# Patient Record
Sex: Male | Born: 1950 | Hispanic: Yes | Marital: Married | Smoking: Former smoker
Health system: Southern US, Community
[De-identification: ages and names within clinical notes are randomized; demographics above are authoritative.]

## PROBLEM LIST (undated history)

## (undated) DIAGNOSIS — N186 End stage renal disease: Secondary | ICD-10-CM

## (undated) DIAGNOSIS — J189 Pneumonia, unspecified organism: Secondary | ICD-10-CM

## (undated) DIAGNOSIS — N189 Chronic kidney disease, unspecified: Secondary | ICD-10-CM

## (undated) DIAGNOSIS — E119 Type 2 diabetes mellitus without complications: Secondary | ICD-10-CM

## (undated) DIAGNOSIS — H547 Unspecified visual loss: Secondary | ICD-10-CM

## (undated) DIAGNOSIS — E11319 Type 2 diabetes mellitus with unspecified diabetic retinopathy without macular edema: Secondary | ICD-10-CM

## (undated) DIAGNOSIS — E0590 Thyrotoxicosis, unspecified without thyrotoxic crisis or storm: Secondary | ICD-10-CM

## (undated) DIAGNOSIS — I10 Essential (primary) hypertension: Secondary | ICD-10-CM

## (undated) DIAGNOSIS — D649 Anemia, unspecified: Secondary | ICD-10-CM

## (undated) DIAGNOSIS — H409 Unspecified glaucoma: Secondary | ICD-10-CM

## (undated) DIAGNOSIS — R011 Cardiac murmur, unspecified: Secondary | ICD-10-CM

## (undated) HISTORY — PX: EYE SURGERY: SHX253

## (undated) HISTORY — DX: Thyrotoxicosis, unspecified without thyrotoxic crisis or storm: E05.90

## (undated) HISTORY — DX: Anemia, unspecified: D64.9

## (undated) HISTORY — DX: Cardiac murmur, unspecified: R01.1

## (undated) HISTORY — PX: TONSILLECTOMY: SUR1361

## (undated) HISTORY — DX: Unspecified visual loss: H54.7

## (undated) HISTORY — DX: Chronic kidney disease, unspecified: N18.9

## (undated) HISTORY — DX: End stage renal disease: N18.6

---

## 2011-09-19 ENCOUNTER — Ambulatory Visit
Admission: RE | Admit: 2011-09-19 | Discharge: 2011-09-19 | Disposition: A | Discharge status: Home / Self Care | Payer: No Typology Code available for payment source | Source: Ambulatory Visit | Attending: Specialist | Admitting: Specialist

## 2011-09-19 ENCOUNTER — Other Ambulatory Visit: Payer: Self-pay | Admitting: Specialist

## 2011-09-19 DIAGNOSIS — R509 Fever, unspecified: Secondary | ICD-10-CM

## 2011-09-19 DIAGNOSIS — R05 Cough: Secondary | ICD-10-CM

## 2012-03-30 ENCOUNTER — Ambulatory Visit
Admission: RE | Admit: 2012-03-30 | Discharge: 2012-03-30 | Disposition: A | Discharge status: Home / Self Care | Payer: No Typology Code available for payment source | Source: Ambulatory Visit | Attending: Specialist | Admitting: Specialist

## 2012-03-30 ENCOUNTER — Other Ambulatory Visit: Payer: Self-pay | Admitting: Specialist

## 2012-03-30 DIAGNOSIS — R05 Cough: Secondary | ICD-10-CM

## 2012-03-30 DIAGNOSIS — R053 Chronic cough: Secondary | ICD-10-CM

## 2014-02-03 ENCOUNTER — Encounter (INDEPENDENT_AMBULATORY_CARE_PROVIDER_SITE_OTHER): Payer: Self-pay | Admitting: Ophthalmology

## 2014-02-03 DIAGNOSIS — H2510 Age-related nuclear cataract, unspecified eye: Secondary | ICD-10-CM

## 2014-02-03 DIAGNOSIS — E1165 Type 2 diabetes mellitus with hyperglycemia: Secondary | ICD-10-CM

## 2014-02-03 DIAGNOSIS — E1139 Type 2 diabetes mellitus with other diabetic ophthalmic complication: Secondary | ICD-10-CM

## 2014-02-03 DIAGNOSIS — H43819 Vitreous degeneration, unspecified eye: Secondary | ICD-10-CM

## 2014-02-03 DIAGNOSIS — H4310 Vitreous hemorrhage, unspecified eye: Secondary | ICD-10-CM

## 2014-02-03 DIAGNOSIS — E11359 Type 2 diabetes mellitus with proliferative diabetic retinopathy without macular edema: Secondary | ICD-10-CM

## 2014-02-10 DIAGNOSIS — H211X9 Other vascular disorders of iris and ciliary body, unspecified eye: Principal | ICD-10-CM

## 2014-02-10 DIAGNOSIS — E103519 Type 1 diabetes mellitus with proliferative diabetic retinopathy with macular edema, unspecified eye: Secondary | ICD-10-CM | POA: Diagnosis present

## 2014-02-10 NOTE — H&P (Signed)
Brian Fuller is an 63 y.o. male.   Chief Complaint:poor vision both eyes HPI: Diabetic with poor medical control.  Very poor vision right eye and poor vision left eye  No past medical history on file.  No past surgical history on file.  No family history on file. Social History:  has no tobacco, alcohol, and drug history on file.  Allergies: Allergies not on file  No prescriptions prior to admission    Review of systems otherwise negative  There were no vitals taken for this visit.  Physical exam: Mental status: oriented x3. Eyes: See eye exam associated with this date of surgery in media tab.  Scanned in by scanning center Ears, Nose, Throat: within normal limits Neck: Within Normal limits General: within normal limits Chest: Within normal limits Breast: deferred Heart: Within normal limits Abdomen: Within normal limits GU: deferred Extremities: within normal limits Skin: within normal limits  Assessment/Plan Proliferative diabetic retinopathy both eyes.  Vitreous hemorrhage both eyes. Plan: To [facility] for Pars plana vitrectomy right eye with laser treatment and gas injection.  Laser treatment, pan retinal left eye.  Hayden Pedro 02/10/2014, 5:31 PM

## 2014-02-21 ENCOUNTER — Encounter (HOSPITAL_COMMUNITY): Payer: Self-pay | Admitting: Pharmacy Technician

## 2014-02-21 ENCOUNTER — Inpatient Hospital Stay (HOSPITAL_COMMUNITY): Payer: Medicaid Other

## 2014-02-21 ENCOUNTER — Inpatient Hospital Stay (HOSPITAL_COMMUNITY)
Admission: EM | Admit: 2014-02-21 | Discharge: 2014-02-25 | DRG: 682 | Disposition: A | Discharge status: Home / Self Care | Payer: Medicaid Other | Attending: Oncology | Admitting: Oncology

## 2014-02-21 ENCOUNTER — Encounter (HOSPITAL_COMMUNITY): Payer: Self-pay | Admitting: Emergency Medicine

## 2014-02-21 DIAGNOSIS — N184 Chronic kidney disease, stage 4 (severe): Secondary | ICD-10-CM | POA: Diagnosis present

## 2014-02-21 DIAGNOSIS — N186 End stage renal disease: Secondary | ICD-10-CM | POA: Insufficient documentation

## 2014-02-21 DIAGNOSIS — E559 Vitamin D deficiency, unspecified: Secondary | ICD-10-CM | POA: Diagnosis present

## 2014-02-21 DIAGNOSIS — N185 Chronic kidney disease, stage 5: Secondary | ICD-10-CM

## 2014-02-21 DIAGNOSIS — I129 Hypertensive chronic kidney disease with stage 1 through stage 4 chronic kidney disease, or unspecified chronic kidney disease: Secondary | ICD-10-CM | POA: Diagnosis present

## 2014-02-21 DIAGNOSIS — N058 Unspecified nephritic syndrome with other morphologic changes: Secondary | ICD-10-CM | POA: Diagnosis present

## 2014-02-21 DIAGNOSIS — I10 Essential (primary) hypertension: Secondary | ICD-10-CM

## 2014-02-21 DIAGNOSIS — E1129 Type 2 diabetes mellitus with other diabetic kidney complication: Secondary | ICD-10-CM | POA: Diagnosis present

## 2014-02-21 DIAGNOSIS — E119 Type 2 diabetes mellitus without complications: Secondary | ICD-10-CM

## 2014-02-21 DIAGNOSIS — Z992 Dependence on renal dialysis: Secondary | ICD-10-CM

## 2014-02-21 DIAGNOSIS — N039 Chronic nephritic syndrome with unspecified morphologic changes: Secondary | ICD-10-CM

## 2014-02-21 DIAGNOSIS — E875 Hyperkalemia: Secondary | ICD-10-CM | POA: Diagnosis present

## 2014-02-21 DIAGNOSIS — H409 Unspecified glaucoma: Secondary | ICD-10-CM | POA: Diagnosis present

## 2014-02-21 DIAGNOSIS — E118 Type 2 diabetes mellitus with unspecified complications: Secondary | ICD-10-CM

## 2014-02-21 DIAGNOSIS — D631 Anemia in chronic kidney disease: Secondary | ICD-10-CM | POA: Diagnosis present

## 2014-02-21 DIAGNOSIS — E1165 Type 2 diabetes mellitus with hyperglycemia: Secondary | ICD-10-CM | POA: Diagnosis present

## 2014-02-21 DIAGNOSIS — N2581 Secondary hyperparathyroidism of renal origin: Secondary | ICD-10-CM | POA: Diagnosis present

## 2014-02-21 DIAGNOSIS — E1139 Type 2 diabetes mellitus with other diabetic ophthalmic complication: Secondary | ICD-10-CM | POA: Diagnosis present

## 2014-02-21 DIAGNOSIS — N170 Acute kidney failure with tubular necrosis: Secondary | ICD-10-CM | POA: Diagnosis present

## 2014-02-21 DIAGNOSIS — N179 Acute kidney failure, unspecified: Secondary | ICD-10-CM

## 2014-02-21 DIAGNOSIS — D649 Anemia, unspecified: Secondary | ICD-10-CM

## 2014-02-21 DIAGNOSIS — I152 Hypertension secondary to endocrine disorders: Secondary | ICD-10-CM

## 2014-02-21 HISTORY — DX: Unspecified glaucoma: H40.9

## 2014-02-21 HISTORY — DX: Type 2 diabetes mellitus without complications: E11.9

## 2014-02-21 LAB — COMPREHENSIVE METABOLIC PANEL
ALBUMIN: 2.9 g/dL — AB (ref 3.5–5.2)
ALT: 16 U/L (ref 0–53)
AST: 16 U/L (ref 0–37)
Alkaline Phosphatase: 102 U/L (ref 39–117)
Anion gap: 15 (ref 5–15)
BUN: 59 mg/dL — ABNORMAL HIGH (ref 6–23)
CHLORIDE: 106 meq/L (ref 96–112)
CO2: 18 mEq/L — ABNORMAL LOW (ref 19–32)
Calcium: 8.7 mg/dL (ref 8.4–10.5)
Creatinine, Ser: 4.41 mg/dL — ABNORMAL HIGH (ref 0.50–1.35)
GFR calc Af Amer: 15 mL/min — ABNORMAL LOW (ref >90)
GFR calc non Af Amer: 13 mL/min — ABNORMAL LOW (ref >90)
Glucose, Bld: 100 mg/dL — ABNORMAL HIGH (ref 70–99)
POTASSIUM: 5.6 meq/L — AB (ref 3.7–5.3)
SODIUM: 139 meq/L (ref 137–147)
Total Protein: 6.5 g/dL (ref 6.0–8.3)

## 2014-02-21 LAB — URINALYSIS, ROUTINE W REFLEX MICROSCOPIC
Bilirubin Urine: NEGATIVE
Bilirubin Urine: NEGATIVE
GLUCOSE, UA: NEGATIVE mg/dL
GLUCOSE, UA: NEGATIVE mg/dL
Ketones, ur: NEGATIVE mg/dL
Ketones, ur: NEGATIVE mg/dL
LEUKOCYTES UA: NEGATIVE
Leukocytes, UA: NEGATIVE
Nitrite: NEGATIVE
Nitrite: NEGATIVE
PH: 6 (ref 5.0–8.0)
PH: 6 (ref 5.0–8.0)
PROTEIN: 100 mg/dL — AB
Protein, ur: 100 mg/dL — AB
SPECIFIC GRAVITY, URINE: 1.011 (ref 1.005–1.030)
Specific Gravity, Urine: 1.012 (ref 1.005–1.030)
Urobilinogen, UA: 0.2 mg/dL (ref 0.0–1.0)
Urobilinogen, UA: 0.2 mg/dL (ref 0.0–1.0)

## 2014-02-21 LAB — RAPID URINE DRUG SCREEN, HOSP PERFORMED
Amphetamines: NOT DETECTED
Barbiturates: NOT DETECTED
Benzodiazepines: NOT DETECTED
Cocaine: NOT DETECTED
OPIATES: NOT DETECTED
Tetrahydrocannabinol: NOT DETECTED

## 2014-02-21 LAB — I-STAT CHEM 8, ED
BUN: 65 mg/dL — ABNORMAL HIGH (ref 6–23)
Calcium, Ion: 1.19 mmol/L (ref 1.13–1.30)
Chloride: 112 mEq/L (ref 96–112)
Creatinine, Ser: 4.7 mg/dL — ABNORMAL HIGH (ref 0.50–1.35)
Glucose, Bld: 91 mg/dL (ref 70–99)
HCT: 25 % — ABNORMAL LOW (ref 39.0–52.0)
HEMOGLOBIN: 8.5 g/dL — AB (ref 13.0–17.0)
Potassium: 5.4 mEq/L — ABNORMAL HIGH (ref 3.7–5.3)
Sodium: 140 mEq/L (ref 137–147)
TCO2: 18 mmol/L (ref 0–100)

## 2014-02-21 LAB — CBC WITH DIFFERENTIAL/PLATELET
BASOS ABS: 0 10*3/uL (ref 0.0–0.1)
BASOS PCT: 0 % (ref 0–1)
Eosinophils Absolute: 0.2 10*3/uL (ref 0.0–0.7)
Eosinophils Relative: 3 % (ref 0–5)
HCT: 24.5 % — ABNORMAL LOW (ref 39.0–52.0)
Hemoglobin: 7.9 g/dL — ABNORMAL LOW (ref 13.0–17.0)
Lymphocytes Relative: 20 % (ref 12–46)
Lymphs Abs: 1.1 10*3/uL (ref 0.7–4.0)
MCH: 27.8 pg (ref 26.0–34.0)
MCHC: 32.2 g/dL (ref 30.0–36.0)
MCV: 86.3 fL (ref 78.0–100.0)
Monocytes Absolute: 0.3 10*3/uL (ref 0.1–1.0)
Monocytes Relative: 6 % (ref 3–12)
NEUTROS ABS: 3.8 10*3/uL (ref 1.7–7.7)
NEUTROS PCT: 71 % (ref 43–77)
PLATELETS: 238 10*3/uL (ref 150–400)
RBC: 2.84 MIL/uL — ABNORMAL LOW (ref 4.22–5.81)
RDW: 14.4 % (ref 11.5–15.5)
WBC: 5.3 10*3/uL (ref 4.0–10.5)

## 2014-02-21 LAB — URINE MICROSCOPIC-ADD ON

## 2014-02-21 LAB — PROTIME-INR
INR: 1.05 (ref 0.00–1.49)
Prothrombin Time: 13.7 seconds (ref 11.6–15.2)

## 2014-02-21 LAB — I-STAT TROPONIN, ED: TROPONIN I, POC: 0.02 ng/mL (ref 0.00–0.08)

## 2014-02-21 LAB — PREPARE RBC (CROSSMATCH)

## 2014-02-21 LAB — APTT: aPTT: 33 seconds (ref 24–37)

## 2014-02-21 LAB — CREATININE, URINE, RANDOM: Creatinine, Urine: 38.18 mg/dL

## 2014-02-21 LAB — PROTEIN / CREATININE RATIO, URINE
Creatinine, Urine: 40.11 mg/dL
Protein Creatinine Ratio: 3.86 — ABNORMAL HIGH (ref 0.00–0.15)
Total Protein, Urine: 154.8 mg/dL

## 2014-02-21 LAB — POC OCCULT BLOOD, ED: FECAL OCCULT BLD: NEGATIVE

## 2014-02-21 LAB — ABO/RH: ABO/RH(D): O POS

## 2014-02-21 LAB — HEMOGLOBIN A1C
Hgb A1c MFr Bld: 6.3 % — ABNORMAL HIGH (ref <5.7)
Mean Plasma Glucose: 134 mg/dL — ABNORMAL HIGH (ref <117)

## 2014-02-21 LAB — GLUCOSE, CAPILLARY: Glucose-Capillary: 168 mg/dL — ABNORMAL HIGH (ref 70–99)

## 2014-02-21 LAB — TROPONIN I

## 2014-02-21 MED ORDER — AMLODIPINE BESYLATE 10 MG PO TABS
10.0000 mg | ORAL_TABLET | Freq: Every day | ORAL | Status: DC
  Administered 2014-02-21 – 2014-02-25 (×5): 10 mg via ORAL
  Filled 2014-02-21 (×5): qty 1

## 2014-02-21 MED ORDER — FUROSEMIDE 10 MG/ML IJ SOLN
80.0000 mg | Freq: Once | INTRAMUSCULAR | Status: DC
  Filled 2014-02-21 (×2): qty 8

## 2014-02-21 MED ORDER — SODIUM CHLORIDE 0.9 % IJ SOLN
3.0000 mL | Freq: Two times a day (BID) | INTRAMUSCULAR | Status: DC
  Administered 2014-02-21 – 2014-02-25 (×8): 3 mL via INTRAVENOUS

## 2014-02-21 MED ORDER — HYDROCHLOROTHIAZIDE 25 MG PO TABS
25.0000 mg | ORAL_TABLET | Freq: Every day | ORAL | Status: DC
  Administered 2014-02-21 – 2014-02-22 (×2): 25 mg via ORAL
  Filled 2014-02-21 (×2): qty 1

## 2014-02-21 MED ORDER — HEPARIN SODIUM (PORCINE) 5000 UNIT/ML IJ SOLN
5000.0000 [IU] | Freq: Three times a day (TID) | INTRAMUSCULAR | Status: DC
  Administered 2014-02-21 – 2014-02-25 (×11): 5000 [IU] via SUBCUTANEOUS
  Filled 2014-02-21 (×14): qty 1

## 2014-02-21 MED ORDER — ONDANSETRON HCL 4 MG PO TABS: 4.0000 mg | ORAL_TABLET | Freq: Four times a day (QID) | ORAL | Status: DC | PRN

## 2014-02-21 MED ORDER — HYDRALAZINE HCL 20 MG/ML IJ SOLN
10.0000 mg | Freq: Once | INTRAMUSCULAR | Status: AC
  Administered 2014-02-21: 10 mg via INTRAVENOUS
  Filled 2014-02-21: qty 1

## 2014-02-21 MED ORDER — ONDANSETRON HCL 4 MG/2ML IJ SOLN: 4.0000 mg | Freq: Four times a day (QID) | INTRAMUSCULAR | Status: DC | PRN

## 2014-02-21 MED ORDER — SODIUM CHLORIDE 0.9 % IV SOLN
INTRAVENOUS | Status: DC
  Administered 2014-02-21: 16:00:00 via INTRAVENOUS

## 2014-02-21 MED ORDER — FUROSEMIDE 10 MG/ML IJ SOLN
40.0000 mg | Freq: Once | INTRAMUSCULAR | Status: AC
  Administered 2014-02-21: 40 mg via INTRAVENOUS

## 2014-02-21 MED ORDER — INSULIN ASPART 100 UNIT/ML ~~LOC~~ SOLN
0.0000 [IU] | Freq: Three times a day (TID) | SUBCUTANEOUS | Status: DC
  Administered 2014-02-22: 1 [IU] via SUBCUTANEOUS
  Administered 2014-02-22 – 2014-02-24 (×4): 2 [IU] via SUBCUTANEOUS

## 2014-02-21 NOTE — Progress Notes (Signed)
Admission note:   Arrival Method: Via stretcher from ED. Mental Status: A&Ox4, spanish speaking. Telemetry: Placed on box #2.   Skin: Did not do skin assessment at this time. Will inform night shift RN.  Tubes: N/A IV: Blood transfusing through RAC NSL.  Pain: Denies.  Family: At bedside. Living Situation: From home. Safety Measures: Bed alarm in place. Call bell within reach. 6E Orientation: Oriented to unit and surroundings.   Patient arrived to unit with blood transfusion at 100 ml/hr. Vitals obtained and all WNL. No reaction noted. Will continue to monitor.  Joellen Jersey, RN.

## 2014-02-21 NOTE — ED Notes (Signed)
Clarified furosemide order with Dr. Algis Liming.

## 2014-02-21 NOTE — H&P (Signed)
Date: 02/21/2014               Patient Name:  Brian Fuller MRN: [id number]  DOB: [birth year] Age / Sex: 63 y.o., male   PCP: No primary provider on file.         Medical Service: Internal Medicine Teaching Service         Attending Physician: Dr. Annia Belt, MD    First Contact: Dr. Albin Felling Pager: [phone]  Second Contact: Dr. Clinton Gallant Pager: [phone]       After Hours (After 5p/  First Contact Pager: [phone]  weekends / holidays): Second Contact Pager: [phone]   Chief Complaint: Anemia  History of Present Illness: Mr. [name] is a 63yo man w/ PMHx of type 2 DM and glaucoma who presented to the ED after instructed by his PCP to go to the ER. Patient speaks only spanish and interview was translated through his family friend. The patient saw his PCP last week for clearance/bloodwork for his eye surgery for glaucoma. The patient was called by his PCP that his hemoglobin level was 7.0 and Cr 4.34, and that he should immediately go to the ER. Besides feeling a little weak, the patient denies fevers, chills, dizziness, CP, SOB, abdominal pain, N/V, melena, and hematochezia. He has no difficulties urinating. Patient was transfused with 1 unit of blood in the ED. FOBT was done that was negative.  Meds: Current Facility-Administered Medications  Medication Dose Route Frequency Provider Last Rate Last Dose  . furosemide (LASIX) injection 80 mg  80 mg Intravenous Once Clinton Gallant, MD       Current Outpatient Prescriptions  Medication Sig Dispense Refill  . furosemide (LASIX) 20 MG tablet Take 20 mg by mouth daily.      . metFORMIN (GLUCOPHAGE) 500 MG tablet Take 1,000 mg by mouth 2 (two) times daily with a meal.      . amLODipine (NORVASC) 10 MG tablet Take 10 mg by mouth daily.      . hydrochlorothiazide (HYDRODIURIL) 25 MG tablet Take 25 mg by mouth daily.        Allergies: Allergies as of 02/21/2014  . (No Known Allergies)   Past Medical History  Diagnosis  Date  . Diabetes mellitus without complication   . Glaucoma    Past Surgical History  Procedure Laterality Date  . Eye surgery     History reviewed. No pertinent family history. History   Social History  . Marital Status: Married    Spouse Name: N/A    Number of Children: N/A  . Years of Education: N/A   Occupational History  . Not on file.   Social History Main Topics  . Smoking status: Never Smoker   . Smokeless tobacco: Not on file  . Alcohol Use: No  . Drug Use: Not on file  . Sexual Activity: Not on file   Other Topics Concern  . Not on file   Social History Narrative  . No narrative on file    Review of Systems: General: Denies malaise, weight changes. HEENT: Denies headaches, eye pain, ear pain, rhinorrhea, sore throat. CV: Denies palpitations, orthopnea. Pulm: Denies cough, wheezing. GI: Denies hematemesis, difficulty swallowing. GU: Denies frequency, urgency, dysuria. Musculoskeletal: Reports leg swelling. Denies muscle cramps. Neuro: Denies tingling/numbness, changes in vision, slurred speech.  Physical Exam: Blood pressure 174/82, pulse 84, temperature 98.1 F (36.7 C), temperature source Oral, resp. rate 17, height 5' 4.96" (1.65 m), weight 136 lb (61.689 kg), SpO2 100.00%.  General: alert, cooperative, anxious appearing  HEENT: Arnold/AT, EOMI, PERRL, sclera anicteric, pale conjunctiva Neck: supple, no JVD CV: RRR, normal S1/S2, no m/g/r Pulm: CTA bilaterally, breaths nonlabored GI: BS+, nondistended, nontender, no hepatosplenomegaly Ext: warm, moves all, trace pitting edema in lower extremities  Lab results: Basic Metabolic Panel:  Recent Labs  02/21/14 1430 02/21/14 1501  NA 139 140  K 5.6* 5.4*  CL 106 112  CO2 18*  --   GLUCOSE 100* 91  BUN 59* 65*  CREATININE 4.41* 4.70*  CALCIUM 8.7  --    Liver Function Tests:  Recent Labs  02/21/14 1430  AST 16  ALT 16  ALKPHOS 102  BILITOT <0.2*  PROT 6.5  ALBUMIN 2.9*   No results  found for this basename: LIPASE, AMYLASE,  in the last 72 hours No results found for this basename: AMMONIA,  in the last 72 hours CBC:  Recent Labs  02/21/14 1430 02/21/14 1501  WBC 5.3  --   NEUTROABS 3.8  --   HGB 7.9* 8.5*  HCT 24.5* 25.0*  MCV 86.3  --   PLT 238  --    Cardiac Enzymes: No results found for this basename: CKTOTAL, CKMB, CKMBINDEX, TROPONINI,  in the last 72 hours BNP: No results found for this basename: PROBNP,  in the last 72 hours D-Dimer: No results found for this basename: DDIMER,  in the last 72 hours CBG: No results found for this basename: GLUCAP,  in the last 72 hours Hemoglobin A1C: No results found for this basename: HGBA1C,  in the last 72 hours Fasting Lipid Panel: No results found for this basename: CHOL, HDL, LDLCALC, TRIG, CHOLHDL, LDLDIRECT,  in the last 72 hours Thyroid Function Tests: No results found for this basename: TSH, T4TOTAL, FREET4, T3FREE, THYROIDAB,  in the last 72 hours Anemia Panel: No results found for this basename: VITAMINB12, FOLATE, FERRITIN, TIBC, IRON, RETICCTPCT,  in the last 72 hours Coagulation:  Recent Labs  02/21/14 1430  LABPROT 13.7  INR 1.05   Urine Drug Screen: Drugs of Abuse  No results found for this basename: labopia, cocainscrnur, labbenz, amphetmu, thcu, labbarb    Alcohol Level: No results found for this basename: ETH,  in the last 72 hours Urinalysis: No results found for this basename: COLORURINE, APPERANCEUR, LABSPEC, PHURINE, GLUCOSEU, HGBUR, BILIRUBINUR, KETONESUR, PROTEINUR, UROBILINOGEN, NITRITE, LEUKOCYTESUR,  in the last 72 hours  Imaging results:  No results found.  Other results: EKG: normal EKG, normal sinus rhythm, nonspecific T wave abnormalities, lateral leads- early repol pattern  Assessment & Plan: Mr. [name] is a 63yo man w/ PMHx of type 2 DM and glaucoma who presented to the ER with anemia.  1. Anemia: Etiology of anemia likely anemia of chronic disease  (undiagnosed kidney disease? HIV?) vs. Iron deficiency vs. Malignancy (less likely due to lack of patient's symptoms, FOBT neg). Patient had Hbg of 7.0 at PCP's office last week and is currently at 7.9. He was transfused with 1 unit of blood in the ED. Patient is asymptomatic at this time.  - Trend Hbg--> f/u CBC in AM - FOBT  - HIV antibody ordered - Iron studies ordered - Monitor for anemia symptoms - Telemetry  2. Acute Kidney Injury: Patient presented with Cr 4.70 and BUN 65. Etiology likely prerenal (ATN vs. Dehydration) or intrinsic (HTN vs. Infection vs. Autoimmune) or postrenal (obstruction). Patient appears fluid overloaded with trace pitting edema in lower extremities. He was given Lasix 40mg  IV.  - Urinalysis ordered -  Urine Cr ordered - Urine, protein/Cr ratio ordered - Occult blood in stool ordered - Renal U/S ordered  3. HTN: Presented with BP 210/89. Patient takes Lasix 20mg , Amlodipine 10mg , and HCTZ 25mg  at home.  - Hydralazine 10mg  IV PRN SBP >170 and DBP >110 - Continue to monitor - Will resume home meds once stable  4. Type 2 DM: On Metformin 500mg  BID at home. Will d/c Metformin during hospital stay and place on insulin sliding scale. - Novolog sliding scale   Dispo: Disposition is deferred at this time, awaiting improvement of current medical problems.   The patient does not have a current PCP (No primary provider on file.) and does need an [facility] hospital follow-up appointment after discharge.  The patient does not have transportation limitations that hinder transportation to clinic appointments.  Signed: Albin Felling, MD 02/21/2014, 4:58 PM

## 2014-02-21 NOTE — ED Notes (Signed)
Pt here for anemia and renal failure. Hs PCP did some blood work a week ago and Hgb was 7. Pt pale. Denies blood in stool or abdominal pain. sts a little weak.

## 2014-02-21 NOTE — ED Notes (Signed)
Pt to xray at this time.

## 2014-02-21 NOTE — ED Provider Notes (Signed)
TIME SEEN: 2:40 PM  CHIEF COMPLAINT: Anemia  HPI: Patient is a 63 year old male with history of non-insulin-dependent diabetes, glaucoma who was seen by his primary care physician Dr. York Ram on Tuesday, 6 days ago and had blood work drawn for preop evaluation for eye surgery for his glaucoma. They called him today and stated that his hemoglobin was 7.0 and his creatinine was 4.34 and advised him to come to the emergency department. Patient states he does feel weak but no chest pain or shortness of breath, no dizziness. No abdominal pain. No black or bloody stools. No vomiting. He is not on anticoagulation. He denies that he has ever been told he had kidney failure in the past but cannot tell me when his last blood work was drawn. He still makes urine.  ROS: See HPI Constitutional: no fever  Eyes: no drainage  ENT: no runny nose   Cardiovascular:  no chest pain  Resp: no SOB  GI: no vomiting GU: no dysuria Integumentary: no rash  Allergy: no hives  Musculoskeletal: no leg swelling  Neurological: no slurred speech ROS otherwise negative  PAST MEDICAL HISTORY/PAST SURGICAL HISTORY:  Past Medical History  Diagnosis Date  . Diabetes mellitus without complication   . Glaucoma     MEDICATIONS:  Prior to Admission medications   Not on File    ALLERGIES:  No Known Allergies  SOCIAL HISTORY:  History  Substance Use Topics  . Smoking status: Never Smoker   . Smokeless tobacco: Not on file  . Alcohol Use: No    FAMILY HISTORY: History reviewed. No pertinent family history.  EXAM: BP 210/89  Pulse 80  Temp(Src) 98.1 F (36.7 C) (Oral)  Resp 20  Ht 5' 4.96" (1.65 m)  Wt 136 lb (61.689 kg)  BMI 22.66 kg/m2  SpO2 100% CONSTITUTIONAL: Alert and oriented and responds appropriately to questions. Well-appearing; well-nourished, in no apparent distress, hemodynamically stable HEAD: Normocephalic EYES: Conjunctivae clear, PERRL, conjunctival pallor ENT: normal nose; no  rhinorrhea; moist mucous membranes; pharynx without lesions noted NECK: Supple, no meningismus, no LAD  CARD: RRR; S1 and S2 appreciated; no murmurs, no clicks, no rubs, no gallops RESP: Normal chest excursion without splinting or tachypnea; breath sounds clear and equal bilaterally; no wheezes, no rhonchi, no rales,  ABD/GI: Normal bowel sounds; non-distended; soft, non-tender, no rebound, no guarding RECTAL:  Normal rectal tone, no gross blood or melena, guaiac negative BACK:  The back appears normal and is non-tender to palpation, there is no CVA tenderness EXT: Normal ROM in all joints; non-tender to palpation; no edema; normal capillary refill; no cyanosis    SKIN: Normal color for age and race; warm NEURO: Moves all extremities equally PSYCH: The patient's mood and manner are appropriate. Grooming and personal hygiene are appropriate.  MEDICAL DECISION MAKING: Patient here with anemia. He is also hypertensive but denies any symptoms associated with this. He is on antihypertensives. His elevated creatinine may be secondary to long-standing hypertension and his kidney disease may be the cause of his anemia. His repeat hemoglobin today is 7.9. Will transfuse one unit. Consented patient with interpreter bedside. We'll discuss with medicine for admission.  ED PROGRESS: Patient does have elevated potassium of 5.6. Will give IV fluids. EKG shows no peaked T waves or interval changes.  D/w IM teaching for admission to tele, inpatient.  We'll give IV hydralazine for his asymptomatic hypertension.    EKG Interpretation  Date/Time:  Monday February 21 2014 15:26:09 EDT Ventricular Rate:  29  PR Interval:  150 QRS Duration: 87 QT Interval:  403 QTC Calculation: 453 R Axis:   12 Text Interpretation:  Age not entered, assumed to be  62 years old for purpose of ECG interpretation Sinus rhythm Nonspecific T abnormalities, lateral leads ST elev, probable normal early repol pattern No reciprocal changes  Confirmed by Tanita Palinkas,  DO, Jodean Valade [phone]) on 02/21/2014 3:31:09 PM        [place], DO 02/21/14 1549

## 2014-02-22 ENCOUNTER — Encounter (INDEPENDENT_AMBULATORY_CARE_PROVIDER_SITE_OTHER): Payer: Self-pay | Admitting: Ophthalmology

## 2014-02-22 ENCOUNTER — Encounter (HOSPITAL_COMMUNITY): Admission: RE | Payer: Self-pay | Source: Ambulatory Visit

## 2014-02-22 ENCOUNTER — Ambulatory Visit (HOSPITAL_COMMUNITY): Admission: RE | Admit: 2014-02-22 | Payer: Self-pay | Source: Ambulatory Visit | Admitting: Ophthalmology

## 2014-02-22 DIAGNOSIS — N185 Chronic kidney disease, stage 5: Secondary | ICD-10-CM

## 2014-02-22 DIAGNOSIS — I120 Hypertensive chronic kidney disease with stage 5 chronic kidney disease or end stage renal disease: Secondary | ICD-10-CM

## 2014-02-22 LAB — RETICULOCYTES
RBC.: 3.03 MIL/uL — ABNORMAL LOW (ref 4.22–5.81)
Retic Count, Absolute: 18.2 10*3/uL — ABNORMAL LOW (ref 19.0–186.0)
Retic Ct Pct: 0.6 % (ref 0.4–3.1)

## 2014-02-22 LAB — TYPE AND SCREEN
ABO/RH(D): O POS
Antibody Screen: NEGATIVE
Unit division: 0

## 2014-02-22 LAB — HEPATITIS PANEL, ACUTE
HCV AB: NEGATIVE
HEP A IGM: NONREACTIVE
HEP B C IGM: NONREACTIVE
HEP B S AG: NEGATIVE

## 2014-02-22 LAB — SAVE SMEAR

## 2014-02-22 LAB — BASIC METABOLIC PANEL
ANION GAP: 15 (ref 5–15)
BUN: 60 mg/dL — ABNORMAL HIGH (ref 6–23)
CO2: 19 meq/L (ref 19–32)
Calcium: 8.7 mg/dL (ref 8.4–10.5)
Chloride: 106 mEq/L (ref 96–112)
Creatinine, Ser: 4.4 mg/dL — ABNORMAL HIGH (ref 0.50–1.35)
GFR calc Af Amer: 15 mL/min — ABNORMAL LOW (ref >90)
GFR calc non Af Amer: 13 mL/min — ABNORMAL LOW (ref >90)
Glucose, Bld: 147 mg/dL — ABNORMAL HIGH (ref 70–99)
Potassium: 5 mEq/L (ref 3.7–5.3)
Sodium: 140 mEq/L (ref 137–147)

## 2014-02-22 LAB — GLUCOSE, CAPILLARY
GLUCOSE-CAPILLARY: 100 mg/dL — AB (ref 70–99)
GLUCOSE-CAPILLARY: 146 mg/dL — AB (ref 70–99)
GLUCOSE-CAPILLARY: 147 mg/dL — AB (ref 70–99)
Glucose-Capillary: 153 mg/dL — ABNORMAL HIGH (ref 70–99)

## 2014-02-22 LAB — IRON AND TIBC
Iron: 60 ug/dL (ref 42–135)
Saturation Ratios: 22 % (ref 20–55)
TIBC: 276 ug/dL (ref 215–435)
UIBC: 216 ug/dL (ref 125–400)

## 2014-02-22 LAB — PHOSPHORUS: Phosphorus: 4.7 mg/dL — ABNORMAL HIGH (ref 2.3–4.6)

## 2014-02-22 LAB — TROPONIN I: Troponin I: <0.3 ng/mL (ref <0.30)

## 2014-02-22 LAB — CBC
HCT: 25.9 % — ABNORMAL LOW (ref 39.0–52.0)
Hemoglobin: 8.8 g/dL — ABNORMAL LOW (ref 13.0–17.0)
MCH: 29.4 pg (ref 26.0–34.0)
MCHC: 34 g/dL (ref 30.0–36.0)
MCV: 86.6 fL (ref 78.0–100.0)
PLATELETS: 215 10*3/uL (ref 150–400)
RBC: 2.99 MIL/uL — ABNORMAL LOW (ref 4.22–5.81)
RDW: 14.5 % (ref 11.5–15.5)
WBC: 4.9 10*3/uL (ref 4.0–10.5)

## 2014-02-22 LAB — UREA NITROGEN, URINE: Urea Nitrogen, Ur: 295 mg/dL

## 2014-02-22 LAB — HAPTOGLOBIN: Haptoglobin: 134 mg/dL (ref 45–215)

## 2014-02-22 LAB — LACTATE DEHYDROGENASE: LDH: 266 U/L — ABNORMAL HIGH (ref 94–250)

## 2014-02-22 LAB — HIV ANTIBODY (ROUTINE TESTING W REFLEX): HIV: NONREACTIVE

## 2014-02-22 LAB — FERRITIN: Ferritin: 11 ng/mL — ABNORMAL LOW (ref 22–322)

## 2014-02-22 LAB — TECHNOLOGIST SMEAR REVIEW

## 2014-02-22 SURGERY — PARS PLANA VITRECTOMY WITH 25 GAUGE
Anesthesia: General | Laterality: Right

## 2014-02-22 MED ORDER — SODIUM CHLORIDE 0.9 % IV SOLN
1020.0000 mg | Freq: Once | INTRAVENOUS | Status: AC
  Administered 2014-02-22: 1020 mg via INTRAVENOUS
  Filled 2014-02-22: qty 34

## 2014-02-22 MED ORDER — PNEUMOCOCCAL VAC POLYVALENT 25 MCG/0.5ML IJ INJ
0.5000 mL | INJECTION | INTRAMUSCULAR | Status: AC
  Administered 2014-02-23: 0.5 mL via INTRAMUSCULAR
  Filled 2014-02-22: qty 0.5

## 2014-02-22 NOTE — Progress Notes (Signed)
I have seen the patient and reviewed the daily progress note by Katherene Ponto MS 4 and discussed the care of the patient with them.  See below for documentation of my findings, assessment, and plans.  Subjective: Pt doing well this AM. Feels swelling in legs has decreased. Has been in [place] for 10+ years with no PCP.   Objective: Vital signs in last 24 hours: Filed Vitals:   02/21/14 2039 02/21/14 2105 02/22/14 0505 02/22/14 0900  BP:  142/72 145/81 133/68  Pulse:  85 76 82  Temp:  98.6 F (37 C) 98.2 F (36.8 C) 98.4 F (36.9 C)  TempSrc:  Oral  Oral  Resp:  20 18 18   Height:      Weight: 127 lb 13.9 oz (58 kg)     SpO2:  98% 98% 98%   Weight change:   Intake/Output Summary (Last 24 hours) at 02/22/14 1513 Last data filed at 02/22/14 1441  Gross per 24 hour  Intake  792.5 ml  Output   2350 ml  Net -1557.5 ml   General: resting in bed HEENT: PERRL, EOMI, no scleral icterus Cardiac: RRR, no rubs, murmurs or gallops Pulm: clear to auscultation bilaterally, moving normal volumes of air Abd: soft, nontender, nondistended, BS present Ext: warm and well perfused, no pedal edema Neuro: alert and oriented X3, cranial nerves II-XII grossly intact  Lab Results: Reviewed and documented in Electronic Record Micro Results: Reviewed and documented in Electronic Record Studies/Results: Reviewed and documented in Electronic Record Medications: I have reviewed the patient's current medications. Scheduled Meds: . amLODipine  10 mg Oral Daily  . heparin  5,000 Units Subcutaneous 3 times per day  . insulin aspart  0-9 Units Subcutaneous TID WC  . [START ON 02/23/2014] pneumococcal 23 valent vaccine  0.5 mL Intramuscular Tomorrow-1000  . sodium chloride  3 mL Intravenous Q12H   Continuous Infusions:  PRN Meds:.ondansetron (ZOFRAN) IV, ondansetron Assessment/Plan: # Acute vs Chronic Anemia: Pt presented from PCP office with Hgb of 7.0 pt was asymptomatic and doesn't have a known  baseline. Pt received 1 PRBCs and Hgb 8.8. FOBT still pending. Hemolysis labs essentially negative and blood smear showed elliptocytes. Trops neg and CXR showed cardiomegaly but no pulmonary edema.  - trend Hgb  -blood smear -anemia panel including Fe studies -baseline erythropoietin level pending if CKD is etiology  #ARF vs CKD: Etiology still unknown. Unknown baseline but on presentation pt Cr 4. With unknonw baseline. Pt origionally appeared fluid overloaded and seems to be taking lasix 20mg  as an outpt. In ED pt was given fluids and then diuresed with IV lasix 40mg . Initial workup has included renal US that did not show any mass (that could also be contributing to anemia) but echogenic consistent with chronic disease, spot protein/Cr showed 3.86 indicating nephrotic range proteinuria, Suanne Marker was indicating possible intrarenal pathology, Urine micro showed tubular casts consistent with ATN. HIV neg, UDS neg. 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  -Renal consult appreciate recs -cont I&O, daily weights  # HTN: Pt origiojnally HTN on admission and received prn hydralazine with good control. Some component of anxiety in setting of language barrier.  - amlodipine 10mg  qd  # DM: HgbA1c 6.3  -hold metformin -pt on SSI   Dispo: Disposition is deferred at this time, awaiting improvement of current medical problems.  Anticipated discharge in approximately 2-3 day(s).   Services Needed at time of  discharge: Y = Yes, Blank = No PT:   OT:   RN:   Equipment:   Other:     LOS: 1 day   Clinton Gallant, MD 02/22/2014, 3:13 PM

## 2014-02-22 NOTE — Progress Notes (Signed)
Subjective: Spoke with patient via a phone interpreter.  No acute events overnight. Pt reports feeling better this morning after blood transfusion last night. He also received 40 mg of lasix for lower extremity edema and currently appears to be euvolemic. He has no complaints this morning and denise shortness of breath, chest pain, or vision changes. He denies any urinary symptoms or gross hematuria.  We attempted to contract PCP to obtain medical history, but they did not have labs or clinic notes other than from 02/15/14 which is consistent with our lab findings on admission. He has not seen any other doctor since moving from [place] ~10 years ago, but reports having a 25-30 year history of diabetes.  Objective: Vital signs in last 24 hours: Filed Vitals:   02/21/14 2039 02/21/14 2105 02/22/14 0505 02/22/14 0900  BP:  142/72 145/81 133/68  Pulse:  85 76 82  Temp:  98.6 F (37 C) 98.2 F (36.8 C) 98.4 F (36.9 C)  TempSrc:  Oral  Oral  Resp:  20 18 18   Height:      Weight: 58 kg (127 lb 13.9 oz)     SpO2:  98% 98% 98%   Weight change:   Intake/Output Summary (Last 24 hours) at 02/22/14 1410 Last data filed at 02/22/14 0900  Gross per 24 hour  Intake  552.5 ml  Output   1825 ml  Net -1272.5 ml   BP 133/68  Pulse 82  Temp(Src) 98.4 F (36.9 C) (Oral)  Resp 18  Ht 5' 4.96" (1.65 m)  Wt 58 kg (127 lb 13.9 oz)  BMI 21.30 kg/m2  SpO2 98%  General Appearance:    Alert, cooperative, no distress, appears stated age  Head:    Normocephalic, without obvious abnormality, atraumatic  Eyes:    PERRL, conjunctiva/corneas clear, EOM's intact, fundi    benign, both eyes       Throat:   Lips, mucosa, and tongue normal; teeth and gums normal  Neck:   Supple, symmetrical, trachea midline, no adenopathy;       thyroid:  No enlargement/tenderness/nodules; no carotid   bruit or JVD  Lungs:     Clear to auscultation bilaterally, respirations unlabored  Chest wall:    No tenderness or  deformity  Heart:    Regular rate and rhythm, S1 and S2 normal, no murmur, rub   or gallop  Abdomen:     Soft, non-tender, normoactive bowel sounds,    no masses, no organomegaly  Extremities:   Extremities normal, atraumatic, no cyanosis or edema  Pulses:   2+ and symmetric all extremities  Skin:   Skin color, texture, turgor normal, no rashes or lesions      Lab Results:   Basic Metabolic Panel:  Recent Labs  02/21/14 1430 02/21/14 1501 02/22/14 0039  NA 139 140 140  K 5.6* 5.4* 5.0  CL 106 112 106  CO2 18*  --  19  GLUCOSE 100* 91 147*  BUN 59* 65* 60*  CREATININE 4.41* 4.70* 4.40*  CALCIUM 8.7  --  8.7   Liver Function Tests:  Recent Labs  02/21/14 1430  AST 16  ALT 16  ALKPHOS 102  BILITOT <0.2*  PROT 6.5  ALBUMIN 2.9*   No results found for this basename: LIPASE, AMYLASE,  in the last 72 hours No results found for this basename: AMMONIA,  in the last 72 hours CBC:  Recent Labs  02/21/14 1430 02/21/14 1501 02/22/14 0039  WBC 5.3  --  4.9  NEUTROABS 3.8  --   --   HGB 7.9* 8.5* 8.8*  HCT 24.5* 25.0* 25.9*  MCV 86.3  --  86.6  PLT 238  --  215   Cardiac Enzymes:  Recent Labs  02/21/14 2005 02/22/14 0039  TROPONINI <0.30 <0.30   BNP: No results found for this basename: PROBNP,  in the last 72 hours D-Dimer: No results found for this basename: DDIMER,  in the last 72 hours CBG:  Recent Labs  02/21/14 2038 02/22/14 0759 02/22/14 1152  GLUCAP 168* 100* 153*   Hemoglobin A1C:  Recent Labs  02/21/14 1729  HGBA1C 6.3*   Fasting Lipid Panel: No results found for this basename: CHOL, HDL, LDLCALC, TRIG, CHOLHDL, LDLDIRECT,  in the last 72 hours Thyroid Function Tests: No results found for this basename: TSH, T4TOTAL, FREET4, T3FREE, THYROIDAB,  in the last 72 hours Anemia Panel:  Recent Labs  02/22/14 0039  FERRITIN 11*  TIBC 276  IRON 60  RETICCTPCT 0.6   Coagulation:  Recent Labs  02/21/14 1430  LABPROT 13.7  INR 1.05    Urine Drug Screen: Drugs of Abuse     Component Value Date/Time   LABOPIA NONE DETECTED 02/21/2014 1744   COCAINSCRNUR NONE DETECTED 02/21/2014 1744   LABBENZ NONE DETECTED 02/21/2014 1744   AMPHETMU NONE DETECTED 02/21/2014 1744   THCU NONE DETECTED 02/21/2014 1744   LABBARB NONE DETECTED 02/21/2014 1744    Alcohol Level: No results found for this basename: ETH,  in the last 72 hours Urinalysis:  Recent Labs  02/21/14 1744 02/21/14 1851  COLORURINE YELLOW YELLOW  LABSPEC 1.012 1.011  PHURINE 6.0 6.0  GLUCOSEU NEGATIVE NEGATIVE  HGBUR TRACE* TRACE*  BILIRUBINUR NEGATIVE NEGATIVE  KETONESUR NEGATIVE NEGATIVE  PROTEINUR 100* 100*  UROBILINOGEN 0.2 0.2  NITRITE NEGATIVE NEGATIVE  LEUKOCYTESUR NEGATIVE NEGATIVE      Micro Results: No results found for this or any previous visit (from the past 240 hour(s)). Studies/Results: Dg Chest 2 View  02/21/2014   CLINICAL DATA:  Anemia.  EXAM: CHEST  2 VIEW  COMPARISON:  No priors.  FINDINGS: Lung volumes are low. No consolidative airspace disease. No pleural effusions. No evidence of pulmonary edema. Heart size is moderately enlarged. Upper mediastinal contours are within normal limits.  IMPRESSION: 1. Cardiomegaly. 2. No radiographic evidence of acute cardiopulmonary disease.   Electronically Signed   By: Vinnie Langton M.D.   On: 02/21/2014 18:12   US Renal  02/22/2014   CLINICAL DATA:  Acute renal failure.  Diabetes  EXAM: RENAL/URINARY TRACT ULTRASOUND COMPLETE  COMPARISON:  None.  FINDINGS: Right Kidney:  Length: 10.8. Slight increase in parenchymal echogenicity. No mass or hydronephrosis visualized.  Left Kidney:  Length: 11.1 cm. Slight increase in parenchymal echogenicity. No mass or hydronephrosis visualized.  Bladder:  Appears normal for degree of bladder distention.  IMPRESSION: 1. No evidence for obstructive uropathy. 2. Mild increased parenchymal echogenicity suggestive of chronic medical renal disease.   Electronically Signed   By:  Kerby Moors M.D.   On: 02/22/2014 08:14   Medications: I have reviewed the patient's current medications. Scheduled Meds: . amLODipine  10 mg Oral Daily  . heparin  5,000 Units Subcutaneous 3 times per day  . insulin aspart  0-9 Units Subcutaneous TID WC  . [START ON 02/23/2014] pneumococcal 23 valent vaccine  0.5 mL Intramuscular Tomorrow-1000  . sodium chloride  3 mL Intravenous Q12H   Continuous Infusions:  PRN Meds:.ondansetron (ZOFRAN) IV, ondansetron  Assessment/Plan:  Brian Fuller is a 63 yo male with PMH of hypertension, diabetes and glaucoma with an unclear PMH who has anemia and elevated creatinine likely consistent with end stage renal disease.  Active Problems:   Anemia   Glaucoma   Diabetes   Acute renal failure  Renal failure- Likely chronic given history long history of diabetes and hypertension. S/p fluid bolus and subsequent lasix diuresis given lower extremity edema. Creatinine has dropped from 4.7 to 4.4. Protein to creatine ratio was 3.8 indicating likely nephrotic component. Ultrasound showed no evidence of obstructive uropathy and echogenicity suggested of chronic renal disease. 24 hour urine protein was 158 with a calculated FENa of 61%. AKI is still on differential but much less likely given ultrasound and history of diabetes/hypertension. Patient is currently stable and does not need emergency dialysis. - Renal consult, Appreciated recommendations - Continue to trend daily metabolic panel. - Will likely need EPO therapy in the future. - Further urine studies per nephrology  - lower extremity edema improved with IV lasix.  Normocytic anemia- MCV of 86 with retic count of 0.6% with smear showing elliptocytes. S/p one unit of pRBCs. Most likely secondary to end stage kidney disease, but malignancy is also on differential given age. Hemolytic anemia is less likely but will f/u on blood smear and haptoglobin. LDH only mildly elevated. Will not consider  further oncologic workup until after renal workup. Pt has ferritin of 11 but other iron studies are normal.  - fecal occult blood test. - erythropoietin level - f/u haptoglobin - consider future EPO therapy if 2/2 chronic renal disease. - given low ferritin will consider iron therapy - trend daily CBC. - HIV negative - Continue telemetry  Hypertension- much improved today with systolic today of [id number] down from >200 yesterday on admission. S/p one dose of hydralazine 10 mg - hydralazine 10 mg PRN for systolic [id number] diastolic [id number].  - continue on home amlodipine 10mg  - hold home furosemide and HCTZ until renal picture more clear.   Diabetes- 25 year history. Currently holding home metformin BID. - continue novolog sliding scale   Dispo: Disposition is deferred at this time, awaiting improvement of current medical problems.  Anticipated discharge in approximately 3 day(s).   The patient does have a current PCP (Dr. York Ram) and does not know need an [facility] hospital follow-up appointment after discharge.   .Services Needed at time of discharge: Y = Yes, Blank = No PT: N  OT: N  RN: N  Equipment: N  Other:     LOS: 1 day   Azucena Cecil, Med Student 02/22/2014, 2:10 PM

## 2014-02-22 NOTE — H&P (Addendum)
Attending physician admitting note: I personally examined and interviewed this patient with the assistance of a Spanish interpreter. Presenting problems, physical findings, problem assessment and management plan accurate as recorded by resident physician Dr. Gypsy Decant. 63 year old man who moved here from [place] about 10 years ago. He has known hypertension and an approximate 25 year history of diabetes. He has not  sought medical attention since he has been in the [place] until very recently when he saw a primary care physician in anticipation of cataract surgery. Routine laboratory studies were done and revealed major abnormalities with advanced renal disease with creatinine 4.4, BUN 59, significant normochromic anemia with hemoglobin 7.0. He was referred for immediate admission. Initial exam: Blood pressure elevated at 188/80 with repeat value 210/89. Current exam: Blood pressure 138/74, pulse 79, temperature 97.8 F (36.6 C), temperature source Oral, resp. rate 18, height 5' 4.96" (1.65 m), weight 127 lb 13.9 oz (58 kg), SpO2 99.00%. Lungs are clear. Resonant to percussion. Regular cardiac rhythm no murmur or gallop. Carotids 2+. No bruits. Abdomen is soft and nontender. No renal or aortic bruits. No palpable mass or organomegaly. Extremities no edema on my exam following parenteral diuretic given last evening., no calf tenderness, no focal neurologic deficit. Motor strength 5 over 5. Full extraocular movements. Reflexes absent symmetric at the knees. Not assessed at the biceps secondary to  bilateral intravenous lines.  Impression: #1. Advanced renal disease likely secondary to untreated, chronic, hypertension, and diabetes. #2. Anemia of renal insufficiency  Plan: Treat hypertension and diabetes Further evaluate kidney function with 24-hour urine for total protein and creatinine clearance, and immunoelectrophoresis Ultrasound of the kidneys Nephrology consultation

## 2014-02-22 NOTE — Progress Notes (Signed)
Interpreter line used to complete patient's physical assessment and part of admission history. Will complete admission history once patient finishes breakfast.   Joellen Jersey, RN.

## 2014-02-22 NOTE — Consult Note (Signed)
Patient ID: Brian Fuller male   DOB: 1951-02-12 63 y.o.   MRN: [id number]  Reason for Consult: AKI Referring Physician: Dr. Beryle Beams  HPI: Brian Fuller is a 63 y.o. male with a PMH significant for DMII (20 yr history), HTN, and diabetic nephropathy who presented to the ED after his PCP found his Cr 4.34 and hgb 7 six days ago during pre-op labs for retinopathy.  Today, his Cr 4.40 (4.41-->4.70-->4.40).  Hgb today is 8.8 s/p 1 unit of prbcs.  He was given 1 dose of 7m IV lasix for volume overload.  At home meds include: furosemide 232m metformin 200099maily, norvasc 26m26mnd HCTZ 25mg51mew medications include furosemide given 1 week ago for LE swelling.  Endorses blurry vision and increased fatigue for the past several months.  Also reports difficulty with urine stream, no dysuria.  Denies any CP, SOB, recent N/V/D, hematochezia, melena.  No abx use.  Denies any NSAID use.  No ACEi/ARB.  No IV contrast.  History of cigarette use 20 yrs ago. No recreational drug use or alcohol use.  Denies any FH of kidney problems.  No rash.   PMH:   Past Medical History  Diagnosis Date  . Diabetes mellitus without complication   . Glaucoma     PSH:   Past Surgical History  Procedure Laterality Date  . Eye surgery      Allergies: No Known Allergies  Medications:   Prior to Admission medications   Medication Sig Start Date End Date Taking? Authorizing Provider  furosemide (LASIX) 20 MG tablet Take 20 mg by mouth daily.   Yes Historical Provider, MD  metFORMIN (GLUCOPHAGE) 500 MG tablet Take 1,000 mg by mouth 2 (two) times daily with a meal.   Yes Historical Provider, MD  amLODipine (NORVASC) 10 MG tablet Take 10 mg by mouth daily.    Historical Provider, MD  hydrochlorothiazide (HYDRODIURIL) 25 MG tablet Take 25 mg by mouth daily.    Historical Provider, MD    Discontinued Meds:   Medications Discontinued During This Encounter  Medication Reason  . 0.9 %  sodium chloride  infusion   . furosemide (LASIX) injection 80 mg   . hydrochlorothiazide (HYDRODIURIL) tablet 25 mg     Social History:  reports that he has never smoked. He does not have any smokeless tobacco history on file. He reports that he does not drink alcohol. His drug history is not on file.  Family History:  History reviewed. No pertinent family history.  Review of Systems: Noted in HPI.   Creatinine, Ser  Date/Time Value Ref Range Status  02/22/2014 12:39 AM 4.40* 0.50 - 1.35 mg/dL Final  02/21/2014  3:01 PM 4.70* 0.50 - 1.35 mg/dL Final  02/21/2014  2:30 PM 4.41* 0.50 - 1.35 mg/dL Final    Recent Labs Lab 02/21/14 1430 02/21/14 1501 02/22/14 0039  NA 139 140 140  K 5.6* 5.4* 5.0  CL 106 112 106  CO2 18*  --  19  GLUCOSE 100* 91 147*  BUN 59* 65* 60*  CREATININE 4.41* 4.70* 4.40*  CALCIUM 8.7  --  8.7    Recent Labs Lab 02/21/14 1430 02/21/14 1501 02/22/14 0039  WBC 5.3  --  4.9  NEUTROABS 3.8  --   --   HGB 7.9* 8.5* 8.8*  HCT 24.5* 25.0* 25.9*  MCV 86.3  --  86.6  PLT 238  --  215   Liver Function Tests:  Recent Labs Lab 02/21/14 1430  AST 16  ALT 16  ALKPHOS 102  BILITOT <0.2*  PROT 6.5  ALBUMIN 2.9*   No results found for this basename: LIPASE, AMYLASE,  in the last 168 hours No results found for this basename: AMMONIA,  in the last 168 hours Cardiac Enzymes:  Recent Labs Lab 02/21/14 2005 02/22/14 0039  TROPONINI <0.30 <0.30   Iron Studies:   Recent Labs  02/22/14 0039  IRON 60  TIBC 276  FERRITIN 11*    Results for orders placed during the hospital encounter of 02/21/14 (from the past 48 hour(s))  CBC WITH DIFFERENTIAL     Status: Abnormal   Collection Time    02/21/14  2:30 PM      Result Value Ref Range   WBC 5.3  4.0 - 10.5 K/uL   RBC 2.84 (*) 4.22 - 5.81 MIL/uL   Hemoglobin 7.9 (*) 13.0 - 17.0 g/dL   HCT 24.5 (*) 39.0 - 52.0 %   MCV 86.3  78.0 - 100.0 fL   MCH 27.8  26.0 - 34.0 pg   MCHC 32.2  30.0 - 36.0 g/dL   RDW 14.4  11.5 -  15.5 %   Platelets 238  150 - 400 K/uL   Neutrophils Relative % 71  43 - 77 %   Neutro Abs 3.8  1.7 - 7.7 K/uL   Lymphocytes Relative 20  12 - 46 %   Lymphs Abs 1.1  0.7 - 4.0 K/uL   Monocytes Relative 6  3 - 12 %   Monocytes Absolute 0.3  0.1 - 1.0 K/uL   Eosinophils Relative 3  0 - 5 %   Eosinophils Absolute 0.2  0.0 - 0.7 K/uL   Basophils Relative 0  0 - 1 %   Basophils Absolute 0.0  0.0 - 0.1 K/uL  COMPREHENSIVE METABOLIC PANEL     Status: Abnormal   Collection Time    02/21/14  2:30 PM      Result Value Ref Range   Sodium 139  137 - 147 mEq/L   Potassium 5.6 (*) 3.7 - 5.3 mEq/L   Chloride 106  96 - 112 mEq/L   CO2 18 (*) 19 - 32 mEq/L   Glucose, Bld 100 (*) 70 - 99 mg/dL   BUN 59 (*) 6 - 23 mg/dL   Creatinine, Ser 4.41 (*) 0.50 - 1.35 mg/dL   Calcium 8.7  8.4 - 10.5 mg/dL   Total Protein 6.5  6.0 - 8.3 g/dL   Albumin 2.9 (*) 3.5 - 5.2 g/dL   AST 16  0 - 37 U/L   ALT 16  0 - 53 U/L   Alkaline Phosphatase 102  39 - 117 U/L   Total Bilirubin <0.2 (*) 0.3 - 1.2 mg/dL   GFR calc non Af Amer 13 (*) >90 mL/min   GFR calc Af Amer 15 (*) >90 mL/min   Comment: (NOTE)     The eGFR has been calculated using the CKD EPI equation.     This calculation has not been validated in all clinical situations.     eGFR's persistently <90 mL/min signify possible Chronic Kidney     Disease.   Anion gap 15  5 - 15  APTT     Status: None   Collection Time    02/21/14  2:30 PM      Result Value Ref Range   aPTT 33  24 - 37 seconds  PROTIME-INR     Status: None   Collection Time  02/21/14  2:30 PM      Result Value Ref Range   Prothrombin Time 13.7  11.6 - 15.2 seconds   INR 1.05  0.00 - 1.49  TYPE AND SCREEN     Status: None   Collection Time    02/21/14  2:52 PM      Result Value Ref Range   ABO/RH(D) O POS     Antibody Screen NEG     Sample Expiration 02/24/2014     Unit Number [id number]     Blood Component Type RBC LR PHER2     Unit division 00     Status of Unit  ISSUED,FINAL     Transfusion Status OK TO TRANSFUSE     Crossmatch Result Compatible    PREPARE RBC (CROSSMATCH)     Status: None   Collection Time    02/21/14  2:52 PM      Result Value Ref Range   Order Confirmation ORDER PROCESSED BY BLOOD BANK    ABO/RH     Status: None   Collection Time    02/21/14  2:52 PM      Result Value Ref Range   ABO/RH(D) O POS    POC OCCULT BLOOD, ED     Status: None   Collection Time    02/21/14  2:56 PM      Result Value Ref Range   Fecal Occult Bld NEGATIVE  NEGATIVE  I-STAT CHEM 8, ED     Status: Abnormal   Collection Time    02/21/14  3:01 PM      Result Value Ref Range   Sodium 140  137 - 147 mEq/L   Potassium 5.4 (*) 3.7 - 5.3 mEq/L   Chloride 112  96 - 112 mEq/L   BUN 65 (*) 6 - 23 mg/dL   Creatinine, Ser 4.70 (*) 0.50 - 1.35 mg/dL   Glucose, Bld 91  70 - 99 mg/dL   Calcium, Ion 1.19  1.13 - 1.30 mmol/L   TCO2 18  0 - 100 mmol/L   Hemoglobin 8.5 (*) 13.0 - 17.0 g/dL   HCT 25.0 (*) 39.0 - 52.0 %  HEMOGLOBIN A1C     Status: Abnormal   Collection Time    02/21/14  5:29 PM      Result Value Ref Range   Hemoglobin A1C 6.3 (*) <5.7 %   Comment: (NOTE)                                                                               According to the ADA Clinical Practice Recommendations for 2011, when     HbA1c is used as a screening test:      >=6.5%   Diagnostic of Diabetes Mellitus               (if abnormal result is confirmed)     5.7-6.4%   Increased risk of developing Diabetes Mellitus     References:Diagnosis and Classification of Diabetes Mellitus,Diabetes     IEPP,2951,88(CZYSA 1):S62-S69 and Standards of Medical Care in             Diabetes - 2011,Diabetes Care,2011,34 (Suppl 1):S11-S61.   Mean Plasma Glucose 134 (*) <  117 mg/dL   Comment: Performed at [facility] (ROUTINE TESTING)     Status: None   Collection Time    02/21/14  5:29 PM      Result Value Ref Range   HIV 1&2 Ab, 4th Generation NONREACTIVE   NONREACTIVE   Comment: (NOTE)     A NONREACTIVE HIV Ag/Ab result does not exclude HIV infection since     the time frame for seroconversion is variable. If acute HIV infection     is suspected, a HIV-1 RNA Qualitative TMA test is recommended.     HIV-1/2 Antibody Diff         Not indicated.     HIV-1 RNA, Qual TMA           Not indicated.     PLEASE NOTE: This information has been disclosed to you from records     whose confidentiality may be protected by state law. If your state     requires such protection, then the state law prohibits you from making     any further disclosure of the information without the specific written     consent of the person to whom it pertains, or as otherwise permitted     by law. A general authorization for the release of medical or other     information is NOT sufficient for this purpose.     The performance of this assay has not been clinically validated in     patients less than 34 years old.     Performed at [facility], ED     Status: None   Collection Time    02/21/14  5:30 PM      Result Value Ref Range   Troponin i, poc 0.02  0.00 - 0.08 ng/mL   Comment 3            Comment: Due to the release kinetics of cTnI,     a negative result within the first hours     of the onset of symptoms does not rule out     myocardial infarction with certainty.     If myocardial infarction is still suspected,     repeat the test at appropriate intervals.  URINALYSIS, ROUTINE W REFLEX MICROSCOPIC     Status: Abnormal   Collection Time    02/21/14  5:44 PM      Result Value Ref Range   Color, Urine YELLOW  YELLOW   APPearance CLEAR  CLEAR   Specific Gravity, Urine 1.012  1.005 - 1.030   pH 6.0  5.0 - 8.0   Glucose, UA NEGATIVE  NEGATIVE mg/dL   Hgb urine dipstick TRACE (*) NEGATIVE   Bilirubin Urine NEGATIVE  NEGATIVE   Ketones, ur NEGATIVE  NEGATIVE mg/dL   Protein, ur 100 (*) NEGATIVE mg/dL   Urobilinogen, UA 0.2  0.0 - 1.0  mg/dL   Nitrite NEGATIVE  NEGATIVE   Leukocytes, UA NEGATIVE  NEGATIVE  URINE RAPID DRUG SCREEN (HOSP PERFORMED)     Status: None   Collection Time    02/21/14  5:44 PM      Result Value Ref Range   Opiates NONE DETECTED  NONE DETECTED   Cocaine NONE DETECTED  NONE DETECTED   Benzodiazepines NONE DETECTED  NONE DETECTED   Amphetamines NONE DETECTED  NONE DETECTED   Tetrahydrocannabinol NONE DETECTED  NONE DETECTED   Barbiturates NONE DETECTED  NONE DETECTED   Comment:  DRUG SCREEN FOR MEDICAL PURPOSES     ONLY.  IF CONFIRMATION IS NEEDED     FOR ANY PURPOSE, NOTIFY LAB     WITHIN 5 DAYS.                LOWEST DETECTABLE LIMITS     FOR URINE DRUG SCREEN     Drug Class       Cutoff (ng/mL)     Amphetamine      1000     Barbiturate      200     Benzodiazepine   280     Tricyclics       034     Opiates          300     Cocaine          300     THC              50  URINE MICROSCOPIC-ADD ON     Status: Abnormal   Collection Time    02/21/14  5:44 PM      Result Value Ref Range   Squamous Epithelial / LPF RARE  RARE   WBC, UA 0-2  <3 WBC/hpf   RBC / HPF 0-2  <3 RBC/hpf   Casts HYALINE CASTS (*) NEGATIVE  UREA NITROGEN, URINE     Status: None   Collection Time    02/21/14  6:51 PM      Result Value Ref Range   Urea Nitrogen, Ur 295     Comment: (NOTE)       No established reference range for random urine.     Performed at [facility], URINE, RANDOM     Status: None   Collection Time    02/21/14  6:51 PM      Result Value Ref Range   Creatinine, Urine 38.18    PROTEIN / CREATININE RATIO, URINE     Status: Abnormal   Collection Time    02/21/14  6:51 PM      Result Value Ref Range   Creatinine, Urine 40.11     Total Protein, Urine 154.8     Comment: NO NORMAL RANGE ESTABLISHED FOR THIS TEST   PROTEIN CREATININE RATIO 3.86 (*) 0.00 - 0.15  URINALYSIS, ROUTINE W REFLEX MICROSCOPIC     Status: Abnormal   Collection Time    02/21/14  6:51  PM      Result Value Ref Range   Color, Urine YELLOW  YELLOW   APPearance CLEAR  CLEAR   Specific Gravity, Urine 1.011  1.005 - 1.030   pH 6.0  5.0 - 8.0   Glucose, UA NEGATIVE  NEGATIVE mg/dL   Hgb urine dipstick TRACE (*) NEGATIVE   Bilirubin Urine NEGATIVE  NEGATIVE   Ketones, ur NEGATIVE  NEGATIVE mg/dL   Protein, ur 100 (*) NEGATIVE mg/dL   Urobilinogen, UA 0.2  0.0 - 1.0 mg/dL   Nitrite NEGATIVE  NEGATIVE   Leukocytes, UA NEGATIVE  NEGATIVE  URINE MICROSCOPIC-ADD ON     Status: Abnormal   Collection Time    02/21/14  6:51 PM      Result Value Ref Range   WBC, UA 0-2  <3 WBC/hpf   RBC / HPF 0-2  <3 RBC/hpf   Casts GRANULAR CAST (*) NEGATIVE   Comment: HYALINE CASTS  TROPONIN I     Status: None   Collection Time    02/21/14  8:05 PM  Result Value Ref Range   Troponin I <0.30  <0.30 ng/mL   Comment:            Due to the release kinetics of cTnI,     a negative result within the first hours     of the onset of symptoms does not rule out     myocardial infarction with certainty.     If myocardial infarction is still suspected,     repeat the test at appropriate intervals.  GLUCOSE, CAPILLARY     Status: Abnormal   Collection Time    02/21/14  8:38 PM      Result Value Ref Range   Glucose-Capillary 168 (*) 70 - 99 mg/dL  IRON AND TIBC     Status: None   Collection Time    02/22/14 12:39 AM      Result Value Ref Range   Iron 60  42 - 135 ug/dL   TIBC 276  215 - 435 ug/dL   Saturation Ratios 22  20 - 55 %   UIBC 216  125 - 400 ug/dL   Comment: Performed at [facility]     Status: Abnormal   Collection Time    02/22/14 12:39 AM      Result Value Ref Range   Ferritin 11 (*) 22 - 322 ng/mL   Comment: Performed at [facility]  CBC     Status: Abnormal   Collection Time    02/22/14 12:39 AM      Result Value Ref Range   WBC 4.9  4.0 - 10.5 K/uL   RBC 2.99 (*) 4.22 - 5.81 MIL/uL   Hemoglobin 8.8 (*) 13.0 - 17.0 g/dL   HCT 25.9 (*)  39.0 - 52.0 %   MCV 86.6  78.0 - 100.0 fL   MCH 29.4  26.0 - 34.0 pg   MCHC 34.0  30.0 - 36.0 g/dL   RDW 14.5  11.5 - 15.5 %   Platelets 215  150 - 400 K/uL  TROPONIN I     Status: None   Collection Time    02/22/14 12:39 AM      Result Value Ref Range   Troponin I <0.30  <0.30 ng/mL   Comment:            Due to the release kinetics of cTnI,     a negative result within the first hours     of the onset of symptoms does not rule out     myocardial infarction with certainty.     If myocardial infarction is still suspected,     repeat the test at appropriate intervals.  BASIC METABOLIC PANEL     Status: Abnormal   Collection Time    02/22/14 12:39 AM      Result Value Ref Range   Sodium 140  137 - 147 mEq/L   Potassium 5.0  3.7 - 5.3 mEq/L   Chloride 106  96 - 112 mEq/L   CO2 19  19 - 32 mEq/L   Glucose, Bld 147 (*) 70 - 99 mg/dL   BUN 60 (*) 6 - 23 mg/dL   Creatinine, Ser 4.40 (*) 0.50 - 1.35 mg/dL   Calcium 8.7  8.4 - 10.5 mg/dL   GFR calc non Af Amer 13 (*) >90 mL/min   GFR calc Af Amer 15 (*) >90 mL/min   Comment: (NOTE)     The eGFR has been calculated using the CKD EPI  equation.     This calculation has not been validated in all clinical situations.     eGFR's persistently <90 mL/min signify possible Chronic Kidney     Disease.   Anion gap 15  5 - 15  RETICULOCYTES     Status: Abnormal   Collection Time    02/22/14 12:39 AM      Result Value Ref Range   Retic Ct Pct 0.6  0.4 - 3.1 %   RBC. 3.03 (*) 4.22 - 5.81 MIL/uL   Retic Count, Manual 18.2 (*) 19.0 - 186.0 K/uL  TECHNOLOGIST SMEAR REVIEW     Status: None   Collection Time    02/22/14 12:39 AM      Result Value Ref Range   Tech Review ELLIPTOCYTES     Comment: BURR CELLS  SAVE SMEAR     Status: None   Collection Time    02/22/14 12:39 AM      Result Value Ref Range   Smear Review SMEAR STAINED AND AVAILABLE FOR REVIEW    LACTATE DEHYDROGENASE     Status: Abnormal   Collection Time    02/22/14 12:39 AM       Result Value Ref Range   LDH 266 (*) 94 - 250 U/L   Comment: HEMOLYSIS AT THIS LEVEL MAY AFFECT RESULT  HAPTOGLOBIN     Status: None   Collection Time    02/22/14 12:39 AM      Result Value Ref Range   Haptoglobin 134  45 - 215 mg/dL   Comment: Performed at [facility], CAPILLARY     Status: Abnormal   Collection Time    02/22/14  7:59 AM      Result Value Ref Range   Glucose-Capillary 100 (*) 70 - 99 mg/dL  GLUCOSE, CAPILLARY     Status: Abnormal   Collection Time    02/22/14 11:52 AM      Result Value Ref Range   Glucose-Capillary 153 (*) 70 - 99 mg/dL    Dg Chest 2 View  02/21/2014   CLINICAL DATA:  Anemia.  EXAM: CHEST  2 VIEW  COMPARISON:  No priors.  FINDINGS: Lung volumes are low. No consolidative airspace disease. No pleural effusions. No evidence of pulmonary edema. Heart size is moderately enlarged. Upper mediastinal contours are within normal limits.  IMPRESSION: 1. Cardiomegaly. 2. No radiographic evidence of acute cardiopulmonary disease.   Electronically Signed   By: Vinnie Langton M.D.   On: 02/21/2014 18:12   US Renal  02/22/2014   CLINICAL DATA:  Acute renal failure.  Diabetes  EXAM: RENAL/URINARY TRACT ULTRASOUND COMPLETE  COMPARISON:  None.  FINDINGS: Right Kidney:  Length: 10.8. Slight increase in parenchymal echogenicity. No mass or hydronephrosis visualized.  Left Kidney:  Length: 11.1 cm. Slight increase in parenchymal echogenicity. No mass or hydronephrosis visualized.  Bladder:  Appears normal for degree of bladder distention.  IMPRESSION: 1. No evidence for obstructive uropathy. 2. Mild increased parenchymal echogenicity suggestive of chronic medical renal disease.   Electronically Signed   By: Kerby Moors M.D.   On: 02/22/2014 08:14   Physical Exam: Blood pressure 133/68, pulse 82, temperature 98.4 F (36.9 C), temperature source Oral, resp. rate 18, height 5' 4.96" (1.65 m), weight 127 lb 13.9 oz (58 kg), SpO2  98.00%. Constitutional: Vital signs reviewed.  Patient is well-developed and well-nourished in NAD.  Head: Normocephalic and atraumatic Eyes: PERRL, EOMI, conjunctivae pale, no scleral icterus.  Neck: Supple, trachea  midline normal ROM, no JVD Cardiovascular: RRR, no MRG Pulmonary/Chest: normal effort, CTAB, no wheezes, rales, or rhonchi Abdominal: Soft. Non-tender, non-distended, bowel sounds are normal Extremities: no C/C/E Neurological: A&O x3, cranial nerve II-XII are grossly intact, moving all extremities Skin: Warm, dry and intact. No rash.     Assessment/Plan:  1.   AKI vs. CKD- Baseline creatinine unknown.  Unclear etiology.  Likely more progression of CKD due to diabetic and hypertensive nephropathy.  Pt appears euvolemic without signs of volume overload.  CXR without pulmonary edema.  Renal US c/w chronic kidney disease.  FEUrea not suggestive of prerenal.  Likely no benefit of HCTZ with his current GFR.  No current indications for HD.   -d/c all diuretics -check phos, iPTH, VitD, ANA, complement levels, SPEP,anti-dsDNA, free light chains, hepatitis panel -strict I/O, daily weights -renal panel to monitor electolytes -avoid nephrotoxic agents -would d/c metformin upon discharge   2.   Electrolyte abnormalities- no acute abnormalities   3.   HTN- stable -continue current meds, d/c HCTZ -monitor for goal <130/80  4.   Anemia- stable, 8.8 s/p 1 unit of prbcs, no active bleeding, FOBT pending, iron low, anemia panel wnl.  -would give ferraheme  5.   DM- mgmt per primary  Jones Bales, MD Internal Medicine Teaching Service, PGY-2 02/22/2014, 3:20 PM

## 2014-02-22 NOTE — Progress Notes (Signed)
Utilization review completed.  

## 2014-02-22 NOTE — Consult Note (Signed)
I have personally seen and examined this patient and agree with the assessment/plan as outlined above by Gordy Levan MD (PGY2).  Brian Fuller appears to have acute renal failure on chronic kidney disease (likely ATN-etiology unclear) versus delayed recognition of chronic kidney disease. His renal ultrasound displays echogenic kidneys while his anemia and hypoalbuminemia are more indicative of chronicity of his chronic kidney disease. There is need to rule out the possibility of occult gastrointestinal bleed plus/minus plasma cell dyscrasia. 1. 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 Agree with discontinuation of hydrochlorothiazide given limited efficacy. Clinically, does not appear to be volume depleted and does not have any obvious nephrotoxic insults. 2. Anemia: Appears to be anemia of chronic kidney disease but need to rule out GI blood loss/plasma cell dyscrasia. Needs iron replacement-recommend intravenous iron-Fereheme 1020 mg x1 dose. He will likely need ESA as an outpatient. 3. Metabolic bone disease: We'll screen for this with a serum phosphorus level as well as PTH and 25-hydroxy vitamin D level in addition to calcium level previously checked. Therapy will be determined on resulting labs. 4. Hypertension: Fair control, agree with continuing amlodipine at this time and we'll determine need for furosemide.  Brian Conley K.,MD 02/22/2014 3:36 PM

## 2014-02-23 LAB — GLUCOSE, CAPILLARY
GLUCOSE-CAPILLARY: 157 mg/dL — AB (ref 70–99)
GLUCOSE-CAPILLARY: 72 mg/dL (ref 70–99)
GLUCOSE-CAPILLARY: 183 mg/dL — AB (ref 70–99)
Glucose-Capillary: 80 mg/dL (ref 70–99)

## 2014-02-23 LAB — RENAL FUNCTION PANEL
ALBUMIN: 2.8 g/dL — AB (ref 3.5–5.2)
ANION GAP: 15 (ref 5–15)
BUN: 62 mg/dL — ABNORMAL HIGH (ref 6–23)
CO2: 20 mEq/L (ref 19–32)
Calcium: 9 mg/dL (ref 8.4–10.5)
Chloride: 105 mEq/L (ref 96–112)
Creatinine, Ser: 4.67 mg/dL — ABNORMAL HIGH (ref 0.50–1.35)
GFR calc Af Amer: 14 mL/min — ABNORMAL LOW (ref >90)
GFR, EST NON AFRICAN AMERICAN: 12 mL/min — AB (ref >90)
Glucose, Bld: 95 mg/dL (ref 70–99)
PHOSPHORUS: 4.8 mg/dL — AB (ref 2.3–4.6)
POTASSIUM: 4.9 meq/L (ref 3.7–5.3)
SODIUM: 140 meq/L (ref 137–147)

## 2014-02-23 LAB — C3 COMPLEMENT: C3 COMPLEMENT: 89 mg/dL — AB (ref 90–180)

## 2014-02-23 LAB — PARATHYROID HORMONE, INTACT (NO CA): PTH: 306.3 pg/mL — AB (ref 14.0–72.0)

## 2014-02-23 LAB — CBC
HEMATOCRIT: 27.5 % — AB (ref 39.0–52.0)
Hemoglobin: 9.2 g/dL — ABNORMAL LOW (ref 13.0–17.0)
MCH: 28.5 pg (ref 26.0–34.0)
MCHC: 33.5 g/dL (ref 30.0–36.0)
MCV: 85.1 fL (ref 78.0–100.0)
PLATELETS: 241 10*3/uL (ref 150–400)
RBC: 3.23 MIL/uL — ABNORMAL LOW (ref 4.22–5.81)
RDW: 14.2 % (ref 11.5–15.5)
WBC: 6.5 10*3/uL (ref 4.0–10.5)

## 2014-02-23 LAB — C4 COMPLEMENT: Complement C4, Body Fluid: 32 mg/dL (ref 10–40)

## 2014-02-23 LAB — ANTI-DNA ANTIBODY, DOUBLE-STRANDED: DS DNA AB: 4 [IU]/mL

## 2014-02-23 LAB — ANA: Anti Nuclear Antibody(ANA): NEGATIVE

## 2014-02-23 LAB — VITAMIN D 25 HYDROXY (VIT D DEFICIENCY, FRACTURES): Vit D, 25-Hydroxy: 23 ng/mL — ABNORMAL LOW (ref 30–89)

## 2014-02-23 NOTE — Progress Notes (Signed)
Patient ID: Brian Fuller, male   DOB: September 05, 1950, 63 y.o.   MRN: [id number] Attending physician note: We appreciate nephrology assistance. I personally examined this patient together with resident physician Dr. Clinton Gallant and fourth year medical student Caren Griffins in the presence of a Spanish translator and I concur with evaluation and management plan recorded in their notes. Patient's son was present. We answered all of his questions and concerns. Updated information from the patient's son reveals that he has been under the care of a physician for the last 5 years but was never told that he had any renal dysfunction. Renal ultrasound shows no gross pathology but there is some cortical thinning consistent with chronic medical renal disease. Disease already quite advanced. We will follow recommendations of nephrology. Vein mapping and vascular surgery consultation for AV fistula placement.

## 2014-02-23 NOTE — Progress Notes (Signed)
I have seen the patient and reviewed the daily progress note by Katherene Ponto MS 4 and discussed the care of the patient with them.  See below for documentation of my findings, assessment, and plans.  Subjective: NAEON. Pt doing well. Took some home gabapentin thinking it was for HTN and felt dizzy/nauseous. No gross blood seen anywhere.   Objective: Vital signs in last 24 hours: Filed Vitals:   02/22/14 1724 02/22/14 2010 02/23/14 0447 02/23/14 0937  BP: 138/74 132/72 168/79 172/76  Pulse: 79 77 77 87  Temp: 97.8 F (36.6 C) 98 F (36.7 C) 97.7 F (36.5 C) 97.5 F (36.4 C)  TempSrc: Oral   Oral  Resp: 18 18 18 17   Height:      Weight:  126 lb 1.7 oz (57.2 kg)    SpO2: 99% 97% 98% 96%   Weight change: -9 lb 14.4 oz (-4.489 kg)  Intake/Output Summary (Last 24 hours) at 02/23/14 1335 Last data filed at 02/23/14 0847  Gross per 24 hour  Intake    480 ml  Output    975 ml  Net   -495 ml   General: resting in bed, NAD HEENT: PERRL, EOMI, no scleral icterus Cardiac: RRR, no rubs, murmurs or gallops Pulm: clear to auscultation bilaterally, moving normal volumes of air Abd: soft, nontender, nondistended, BS present Ext: warm and well perfused, no pedal edema Neuro: alert and oriented X3, cranial nerves II-XII grossly intact  Lab Results: Reviewed and documented in Electronic Record Micro Results: Reviewed and documented in Electronic Record Studies/Results: Reviewed and documented in Electronic Record Medications: I have reviewed the patient's current medications. Scheduled Meds: . amLODipine  10 mg Oral Daily  . heparin  5,000 Units Subcutaneous 3 times per day  . insulin aspart  0-9 Units Subcutaneous TID WC  . sodium chloride  3 mL Intravenous Q12H   Continuous Infusions:  PRN Meds:.ondansetron (ZOFRAN) IV, ondansetron Assessment/Plan: # Acute vs Chronic Anemia: Pt presented from PCP office with Hgb of 7.0 pt was asymptomatic and doesn't have a known baseline.  Pt received 1 PRBCs and Hgb 8.8. FOBT still pending. Hemolysis labs essentially negative and blood smear showed elliptocytes. Trops neg and CXR showed cardiomegaly but no pulmonary edema. Pt received ferraheme dose yesterday.  - trend Hgb  -blood smear showed no abnormalities -anemia panel including Fe studies: reveled normal Fe studies but low ferritin (indicating low Fe stores) pt may have mixed picture of chronic disease and Fe deficiency anemia  -baseline erythropoietin level pending if CKD is etiology   #ARF vs CKD: Etiology still unknown. Unknown baseline but on presentation pt Cr 4. With unknonw baseline. Pt origionally appeared fluid overloaded and seems to be taking lasix 20mg  as an outpt. In ED pt was given fluids and then diuresed with IV lasix 40mg . Initial workup has included renal US that did not show any mass (that could also be contributing to anemia) but echogenic consistent with chronic disease, spot protein/Cr showed 3.86 indicating nephrotic range proteinuria, Suanne Marker was indicating possible intrarenal pathology, Urine micro showed tubular casts consistent with ATN. HIV neg, UDS neg. 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  -Renal consult appreciate recs -rec vein mapping today with outpt renal follow up  -cont I&O, daily weights   # HTN: Pt origionally HTN on admission and received prn hydralazine with good control. Some component of anxiety in setting of language barrier.  -  amlodipine 10mg  qd   # DM: HgbA1c 6.3  -hold metformin and d/c on discharge given poor GFR will need insulin outpt -pt on SSI   Dispo: Disposition is deferred at this time, awaiting improvement of current medical problems.  Anticipated discharge in approximately 1-2 day(s).   Services Needed at time of discharge: Y = Yes, Blank = No PT:   OT:   RN:   Equipment:   Other:     LOS: 2 days   Clinton Gallant, MD 02/23/2014, 1:35 PM

## 2014-02-23 NOTE — Progress Notes (Signed)
Nutrition Brief Note  Patient identified on the Malnutrition Screening Tool (MST) Report. Pt reports that he intentionally lost weight because his wife put him on a diet. Historically has a limited appetite and is currently eating well.  Wt Readings from Last 15 Encounters:  02/22/14 126 lb 1.7 oz (57.2 kg)    Body mass index is 21.01 kg/(m^2). Patient meets criteria for normal weight based on current BMI.   Current diet order is Heart Healthy and CHO Modified, patient is consuming approximately >50% of meals at this time. Labs and medications reviewed.   No nutrition interventions warranted at this time. If nutrition issues arise, please consult RD.   Inda Coke MS, RD, LDN Inpatient Registered Dietitian Pager: [phone] After-hours pager: [phone]

## 2014-02-23 NOTE — Progress Notes (Signed)
I have personally seen and examined this patient and agree with the assessment/plan as outlined above by Gordy Levan MD (PGY2). Ongoing w/u for AKI v/s CKD--- hepatitis studies negative, low c3 but normal c4. 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 Will set up for OP f/u with me in the clinic--anticipated DC soon. I have informed the patient that he will likely progress to ESRD and need HD in the near future. Given his socio-economic barriers, would get vein mapping and even get a vascular surgery consult for AVF v/s AVG placement   Arbell Wycoff K.,MD 02/23/2014 10:34 AM

## 2014-02-23 NOTE — Progress Notes (Signed)
Patient ID: Akwasi Kuperus male   DOB: Mar 07, 1951 63 y.o.   MRN: [id number]  S: Pt is doing well this AM.  Denies any CP, SOB, N/V/D/C.  Good UOP: 1.6L yesterday.    O: Vital signs: BP 168/79  Pulse 77  Temp(Src) 97.7 F (36.5 C) (Oral)  Resp 18  Ht 5' 4.96" (1.65 m)  Wt 126 lb 1.7 oz (57.2 kg)  BMI 21.01 kg/m2  SpO2 98%  Intake/Output: I/O last 3 completed shifts: In: 1140 [P.O.:840; Blood:300] Out: 3050 [Urine:2575; Stool:475]  Weight change: Filed Weights   02/21/14 1327 02/21/14 2039 02/22/14 2010  Weight: 136 lb (61.689 kg) 127 lb 13.9 oz (58 kg) 126 lb 1.7 oz (57.2 kg)    Physical Exam: Gen:NAD CVS:RRR Resp:CTAB Abd:+BS, NT/ND Neuro: A&O x3, moving all extremties  Ext:No LE edema bilaterally, no UE edema   Recent Labs Lab 02/21/14 1430 02/21/14 1501 02/22/14 0039 02/22/14 1635 02/23/14 0835  NA 139 140 140  --  140  K 5.6* 5.4* 5.0  --  4.9  CL 106 112 106  --  105  CO2 18*  --  19  --  20  GLUCOSE 100* 91 147*  --  95  BUN 59* 65* 60*  --  62*  CREATININE 4.41* 4.70* 4.40*  --  4.67*  ALBUMIN 2.9*  --   --   --  2.8*  CALCIUM 8.7  --  8.7  --  9.0  PHOS  --   --   --  4.7* 4.8*  AST 16  --   --   --   --   ALT 16  --   --   --   --     Liver Function Tests:  Recent Labs Lab 02/21/14 1430 02/23/14 0835  AST 16  --   ALT 16  --   ALKPHOS 102  --   BILITOT <0.2*  --   PROT 6.5  --   ALBUMIN 2.9* 2.8*   No results found for this basename: LIPASE, AMYLASE,  in the last 168 hours No results found for this basename: AMMONIA,  in the last 168 hours  CBC:  Recent Labs Lab 02/21/14 1430 02/21/14 1501 02/22/14 0039 02/23/14 0835  WBC 5.3  --  4.9 6.5  NEUTROABS 3.8  --   --   --   HGB 7.9* 8.5* 8.8* 9.2*  HCT 24.5* 25.0* 25.9* 27.5*  MCV 86.3  --  86.6 85.1  PLT 238  --  215 241    Cardiac Enzymes:  Recent Labs Lab 02/21/14 2005 02/22/14 0039  TROPONINI <0.30 <0.30    CBG:  Recent Labs Lab 02/22/14 0759  02/22/14 1152 02/22/14 1634 02/22/14 2007 02/23/14 0737  GLUCAP 100* 153* 146* 147* 80    Iron Studies:   Recent Labs  02/22/14 0039  IRON 60  TIBC 276  FERRITIN 11*    Studies/Results: Dg Chest 2 View  02/21/2014   CLINICAL DATA:  Anemia.  EXAM: CHEST  2 VIEW  COMPARISON:  No priors.  FINDINGS: Lung volumes are low. No consolidative airspace disease. No pleural effusions. No evidence of pulmonary edema. Heart size is moderately enlarged. Upper mediastinal contours are within normal limits.  IMPRESSION: 1. Cardiomegaly. 2. No radiographic evidence of acute cardiopulmonary disease.   Electronically Signed   By: Vinnie Langton M.D.   On: 02/21/2014 18:12   US Renal  02/22/2014   CLINICAL DATA:  Acute renal failure.  Diabetes  EXAM: RENAL/URINARY  TRACT ULTRASOUND COMPLETE  COMPARISON:  None.  FINDINGS: Right Kidney:  Length: 10.8. Slight increase in parenchymal echogenicity. No mass or hydronephrosis visualized.  Left Kidney:  Length: 11.1 cm. Slight increase in parenchymal echogenicity. No mass or hydronephrosis visualized.  Bladder:  Appears normal for degree of bladder distention.  IMPRESSION: 1. No evidence for obstructive uropathy. 2. Mild increased parenchymal echogenicity suggestive of chronic medical renal disease.   Electronically Signed   By: Kerby Moors M.D.   On: 02/22/2014 08:14   . amLODipine  10 mg Oral Daily  . heparin  5,000 Units Subcutaneous 3 times per day  . insulin aspart  0-9 Units Subcutaneous TID WC  . pneumococcal 23 valent vaccine  0.5 mL Intramuscular Tomorrow-1000  . sodium chloride  3 mL Intravenous Q12H    BMET:    Component Value Date/Time   NA 140 02/23/2014 0835   K 4.9 02/23/2014 0835   CL 105 02/23/2014 0835   CO2 20 02/23/2014 0835   GLUCOSE 95 02/23/2014 0835   BUN 62* 02/23/2014 0835   CREATININE 4.67* 02/23/2014 0835   CALCIUM 9.0 02/23/2014 0835   GFRNONAA 12* 02/23/2014 0835   GFRAA 14* 02/23/2014 0835    CBC:    Component Value Date/Time   WBC  6.5 02/23/2014 0835   RBC 3.23* 02/23/2014 0835   RBC 3.03* 02/22/2014 0039   HGB 9.2* 02/23/2014 0835   HCT 27.5* 02/23/2014 0835   PLT 241 02/23/2014 0835   MCV 85.1 02/23/2014 0835   MCH 28.5 02/23/2014 0835   MCHC 33.5 02/23/2014 0835   RDW 14.2 02/23/2014 0835   LYMPHSABS 1.1 02/21/2014 1430   MONOABS 0.3 02/21/2014 1430   EOSABS 0.2 02/21/2014 1430   BASOSABS 0.0 02/21/2014 1430     Assessment/Plan:  1. AKI vs.CKD- Cr 4.67 this AM, iPTH pending.  -would consider IVF -continue strict I/O, daily weights  -renal panel to monitor electolytes  -avoid nephrotoxic agents    2. Electrolyte abnormalities- no acute abnormalities   3. HTN- stable  -continue current meds  4. Anemia- stable 9.2 this AM, s/p 1 unit of prbcs, no active bleeding, FOBT pending, iron low, anemia panel wnl.    5. DM- mgmt per primary  6. VitD deficiency- replete    Jones Bales, MD Internal Medicine Teaching Service, PGY-2

## 2014-02-23 NOTE — Progress Notes (Signed)
Subjective: NAEON. Spoke with patient via a phone interpreter (ID # [id number]). Pt reports doing well this morning. Nephrology saw him yesterday and suggested further workup of his kidney failure. Pt denies shortness of breath or chest pain. He reports having a bowel movement yesterday.   Pt today said that he has been seeing his PCP, Dr. York Ram, every three months for the past 6 years, where as yesterday he said he only recently established care.   Of note, he commented that the gabapentin he was taking in the past causes nausea. No record of this medication in chart. Patient thought he was taking it for his blood pressure, but did mention he has has history of neuropathy in his feet.   Objective: Vital signs in last 24 hours: Filed Vitals:   02/22/14 0900 02/22/14 1724 02/22/14 2010 02/23/14 0447  BP: 133/68 138/74 132/72 168/79  Pulse: 82 79 77 77  Temp: 98.4 F (36.9 C) 97.8 F (36.6 C) 98 F (36.7 C) 97.7 F (36.5 C)  TempSrc: Oral Oral    Resp: 18 18 18 18   Height:      Weight:   57.2 kg (126 lb 1.7 oz)   SpO2: 98% 99% 97% 98%   Weight change: -4.489 kg (-9 lb 14.4 oz)  Intake/Output Summary (Last 24 hours) at 02/23/14 0847 Last data filed at 02/23/14 0847  Gross per 24 hour  Intake    840 ml  Output   1675 ml  Net   -835 ml   BP 168/79  Pulse 77  Temp(Src) 97.7 F (36.5 C) (Oral)  Resp 18  Ht 5' 4.96" (1.65 m)  Wt 57.2 kg (126 lb 1.7 oz)  BMI 21.01 kg/m2  SpO2 98%  General Appearance:    Alert, cooperative, no distress, appears stated age  Head:    Normocephalic, without obvious abnormality, atraumatic  Throat:   Lips, mucosa, and tongue normal; teeth and gums normal  Lungs:     Clear to auscultation bilaterally, respirations unlabored  Chest wall:    No tenderness or deformity  Heart:    Regular rate and rhythm, S1 and S2 normal, no murmur, rub   or gallop  Abdomen:     Soft, non-tender, normoactive bowel sounds,    no masses, no organomegaly    Extremities:   Extremities normal, atraumatic, no cyanosis or edema  Skin:   Skin color, texture, turgor normal, no rashes or lesions      Lab Results:   Basic Metabolic Panel:  Recent Labs  02/21/14 1430 02/21/14 1501 02/22/14 0039 02/22/14 1635  NA 139 140 140  --   K 5.6* 5.4* 5.0  --   CL 106 112 106  --   CO2 18*  --  19  --   GLUCOSE 100* 91 147*  --   BUN 59* 65* 60*  --   CREATININE 4.41* 4.70* 4.40*  --   CALCIUM 8.7  --  8.7  --   PHOS  --   --   --  4.7*   Liver Function Tests:  Recent Labs  02/21/14 1430  AST 16  ALT 16  ALKPHOS 102  BILITOT <0.2*  PROT 6.5  ALBUMIN 2.9*   No results found for this basename: LIPASE, AMYLASE,  in the last 72 hours No results found for this basename: AMMONIA,  in the last 72 hours CBC:  Recent Labs  02/21/14 1430 02/21/14 1501 02/22/14 0039  WBC 5.3  --  4.9  NEUTROABS 3.8  --   --   HGB 7.9* 8.5* 8.8*  HCT 24.5* 25.0* 25.9*  MCV 86.3  --  86.6  PLT 238  --  215   Cardiac Enzymes:  Recent Labs  02/21/14 2005 02/22/14 0039  TROPONINI <0.30 <0.30   BNP: No results found for this basename: PROBNP,  in the last 72 hours D-Dimer: No results found for this basename: DDIMER,  in the last 72 hours CBG:  Recent Labs  02/21/14 2038 02/22/14 0759 02/22/14 1152 02/22/14 1634 02/22/14 2007 02/23/14 0737  GLUCAP 168* 100* 153* 146* 147* 80   Hemoglobin A1C:  Recent Labs  02/21/14 1729  HGBA1C 6.3*   Fasting Lipid Panel: No results found for this basename: CHOL, HDL, LDLCALC, TRIG, CHOLHDL, LDLDIRECT,  in the last 72 hours Thyroid Function Tests: No results found for this basename: TSH, T4TOTAL, FREET4, T3FREE, THYROIDAB,  in the last 72 hours Anemia Panel:  Recent Labs  02/22/14 0039  FERRITIN 11*  TIBC 276  IRON 60  RETICCTPCT 0.6   Coagulation:  Recent Labs  02/21/14 1430  LABPROT 13.7  INR 1.05     Urinalysis:  Recent Labs  02/21/14 1744 02/21/14 1851  COLORURINE YELLOW  YELLOW  LABSPEC 1.012 1.011  PHURINE 6.0 6.0  GLUCOSEU NEGATIVE NEGATIVE  HGBUR TRACE* TRACE*  BILIRUBINUR NEGATIVE NEGATIVE  KETONESUR NEGATIVE NEGATIVE  PROTEINUR 100* 100*  UROBILINOGEN 0.2 0.2  NITRITE NEGATIVE NEGATIVE  LEUKOCYTESUR NEGATIVE NEGATIVE     Micro Results: No results found for this or any previous visit (from the past 240 hour(s)). Studies/Results: Dg Chest 2 View  02/21/2014   CLINICAL DATA:  Anemia.  EXAM: CHEST  2 VIEW  COMPARISON:  No priors.  FINDINGS: Lung volumes are low. No consolidative airspace disease. No pleural effusions. No evidence of pulmonary edema. Heart size is moderately enlarged. Upper mediastinal contours are within normal limits.  IMPRESSION: 1. Cardiomegaly. 2. No radiographic evidence of acute cardiopulmonary disease.   Electronically Signed   By: Vinnie Langton M.D.   On: 02/21/2014 18:12   US Renal  02/22/2014   CLINICAL DATA:  Acute renal failure.  Diabetes  EXAM: RENAL/URINARY TRACT ULTRASOUND COMPLETE  COMPARISON:  None.  FINDINGS: Right Kidney:  Length: 10.8. Slight increase in parenchymal echogenicity. No mass or hydronephrosis visualized.  Left Kidney:  Length: 11.1 cm. Slight increase in parenchymal echogenicity. No mass or hydronephrosis visualized.  Bladder:  Appears normal for degree of bladder distention.  IMPRESSION: 1. No evidence for obstructive uropathy. 2. Mild increased parenchymal echogenicity suggestive of chronic medical renal disease.   Electronically Signed   By: Kerby Moors M.D.   On: 02/22/2014 08:14   Medications: I have reviewed the patient's current medications. Scheduled Meds: . amLODipine  10 mg Oral Daily  . heparin  5,000 Units Subcutaneous 3 times per day  . insulin aspart  0-9 Units Subcutaneous TID WC  . pneumococcal 23 valent vaccine  0.5 mL Intramuscular Tomorrow-1000  . sodium chloride  3 mL Intravenous Q12H   Continuous Infusions:  PRN Meds:.ondansetron (ZOFRAN) IV,  ondansetron  Assessment/Plan:  Berthold Walther is a 63 yo male with PMH of hypertension, diabetes and glaucoma with an unclear PMH who has anemia and elevated creatinine likely consistent with end stage renal disease.  Active Problems:   Anemia   Glaucoma   Diabetes   Acute renal failure  Renal failure- Likely chronic given history long history of diabetes and hypertension. S/p fluid bolus and subsequent lasix  diuresis given lower extremity edema. Creatinine has been stable in the mid 4s with GFR<15. Protein to creatine ratio was 3.8 indicating likely nephrotic component. Protein slightly elevated. AKI is still on differential but much less likely given ultrasound and history of diabetes/hypertension. Ultrasound showed no evidence of obstructive uropathy and echogenicity suggested of chronic renal disease.Patient is currently stable and does not need emergency dialysis. - Renal consult, Appreciated recommendations - Continue to trend daily metabolic panel. - started on ferraheme on 7/07 - Will likely need EPO therapy in the future. - Hep panel negative, f/u on autoimmune workup  - d/c all diuretics per renal - avoid nephrotoxic agents  Normocytic anemia- Most likely secondary to chronic renal failure with possible iron deficiency component.  MCV of 86 with retic count of 0.6% with smear showing elliptocytes. S/p one unit of pRBCs.  LDH only mildly elevated. Will not consider further oncologic workup until after renal workup. Pt has ferritin of 11 but other iron studies are normal.  - fecal occult blood test pending. - erythropoietin level pending -  Continue ferraheme as above and consider future EPO therapy if 2/2 chronic renal disease. - trend daily CBC. - Continue telemetry - contact PCP office. Attempted to call but closed today.  Hypertension- systolic in [id number] today which is  down from >200 yesterday on admission. S/p one dose of hydralazine 10 mg on 7/6. - hydralazine 10 mg  PRN for systolic [id number] diastolic [id number].  - continue on home amlodipine 10mg  - d/c home furosemide and HCTZ  Diabetes- 25 year history. Last A1C was 6.3 Currently on SSI.  - d/c metformin due to elevated creatinine.  - consider renal dosed glipizide on discharge. - told patient to stop gabapentin as causing nausea.    Dispo: Disposition is deferred at this time, awaiting improvement of current medical problems.  Anticipated discharge in approximately 2 day(s).   The patient does have a current PCP (Dr. York Ram) and does not know need an [facility] follow-up appointment after discharge.   .Services Needed at time of discharge: Y = Yes, Blank = No PT:   OT:   RN:   Equipment:   Other:     LOS: 2 days   Azucena Cecil, Med Student 02/23/2014, 8:47 AM

## 2014-02-23 NOTE — Progress Notes (Signed)
Medication bottle was found in the room. Patients family was told to take medication home with them. Patient and family were educated on not to bring home medications to hospital. Patient and family understood. Family took medication.

## 2014-02-23 NOTE — Progress Notes (Signed)
Leadership round completed with patient uitilizing the [employer] # [phone]. Patient reeducated regarding the purpose of the [facility] board, and how to contact the nurse/NT if assistance is need. Patient verbalized understanding and confirmed that his needs have been met. Also explained to patient that the lab was present to draw blood. Patient agreeable. Raquell Richer, Bryn Gulling

## 2014-02-24 DIAGNOSIS — I129 Hypertensive chronic kidney disease with stage 1 through stage 4 chronic kidney disease, or unspecified chronic kidney disease: Principal | ICD-10-CM

## 2014-02-24 DIAGNOSIS — N19 Unspecified kidney failure: Secondary | ICD-10-CM

## 2014-02-24 DIAGNOSIS — Z992 Dependence on renal dialysis: Secondary | ICD-10-CM

## 2014-02-24 LAB — BASIC METABOLIC PANEL WITH GFR
Anion gap: 16 — ABNORMAL HIGH (ref 5–15)
BUN: 64 mg/dL — ABNORMAL HIGH (ref 6–23)
CO2: 18 meq/L — ABNORMAL LOW (ref 19–32)
Calcium: 9.2 mg/dL (ref 8.4–10.5)
Chloride: 102 meq/L (ref 96–112)
Creatinine, Ser: 4.82 mg/dL — ABNORMAL HIGH (ref 0.50–1.35)
GFR calc Af Amer: 14 mL/min — ABNORMAL LOW
GFR calc non Af Amer: 12 mL/min — ABNORMAL LOW
Glucose, Bld: 102 mg/dL — ABNORMAL HIGH (ref 70–99)
Potassium: 5.1 meq/L (ref 3.7–5.3)
Sodium: 136 meq/L — ABNORMAL LOW (ref 137–147)

## 2014-02-24 LAB — CBC
HCT: 25.4 % — ABNORMAL LOW (ref 39.0–52.0)
Hemoglobin: 8.4 g/dL — ABNORMAL LOW (ref 13.0–17.0)
MCH: 28.5 pg (ref 26.0–34.0)
MCHC: 33.1 g/dL (ref 30.0–36.0)
MCV: 86.1 fL (ref 78.0–100.0)
Platelets: 214 10*3/uL (ref 150–400)
RBC: 2.95 MIL/uL — ABNORMAL LOW (ref 4.22–5.81)
RDW: 14.2 % (ref 11.5–15.5)
WBC: 4.9 10*3/uL (ref 4.0–10.5)

## 2014-02-24 LAB — MAGNESIUM: MAGNESIUM: 2.1 mg/dL (ref 1.5–2.5)

## 2014-02-24 LAB — ERYTHROPOIETIN: Erythropoietin: 7.2 m[IU]/mL (ref 2.6–18.5)

## 2014-02-24 LAB — GLUCOSE, CAPILLARY
Glucose-Capillary: 74 mg/dL (ref 70–99)
Glucose-Capillary: 163 mg/dL — ABNORMAL HIGH (ref 70–99)
Glucose-Capillary: 166 mg/dL — ABNORMAL HIGH (ref 70–99)
Glucose-Capillary: 197 mg/dL — ABNORMAL HIGH (ref 70–99)

## 2014-02-24 MED ORDER — DARBEPOETIN ALFA-POLYSORBATE 60 MCG/0.3ML IJ SOLN
60.0000 ug | Freq: Once | INTRAMUSCULAR | Status: AC
  Administered 2014-02-24: 60 ug via SUBCUTANEOUS
  Filled 2014-02-24: qty 0.3

## 2014-02-24 MED ORDER — CARVEDILOL 6.25 MG PO TABS
6.2500 mg | ORAL_TABLET | Freq: Two times a day (BID) | ORAL | Status: DC
  Administered 2014-02-24 – 2014-02-25 (×2): 6.25 mg via ORAL
  Filled 2014-02-24 (×4): qty 1

## 2014-02-24 MED ORDER — CALCITRIOL 0.25 MCG PO CAPS
0.2500 ug | ORAL_CAPSULE | Freq: Every day | ORAL | Status: DC
  Administered 2014-02-24 – 2014-02-25 (×2): 0.25 ug via ORAL
  Filled 2014-02-24 (×2): qty 1

## 2014-02-24 NOTE — Progress Notes (Signed)
Subjective: NAEON. Patient was sitting up in bed and doing well this morning. He has no acute complaints and denies any abdominal pain, urinary problems, shortness of breath or chest pain. He is ambulatory and has been walking in his room. He continues to be seen daily by the nephrology team and has spoke with the nutrition educator about appropriate renal diet.   We spent a good deal of time discussing plane of care with patient yesterday afternoon. He understands the plan to follow up with nephrology and would like to establish care with general medicine clinic as long as he is approved for the "orange card".   Objective: Vital signs in last 24 hours: Filed Vitals:   02/23/14 1716 02/23/14 2039 02/24/14 0443 02/24/14 1007  BP: 153/66 163/75 150/74 148/71  Pulse: 84 81 78 86  Temp: 98 F (36.7 C) 98.1 F (36.7 C) 97.8 F (36.6 C) 98 F (36.7 C)  TempSrc: Oral Oral Oral Oral  Resp: 18 18 18 17   Height:      Weight:  57.201 kg (126 lb 1.7 oz)    SpO2: 94% 99% 99% 100%   Weight change: 0.001 kg (0.1 oz)  Intake/Output Summary (Last 24 hours) at 02/24/14 1333 Last data filed at 02/24/14 0452  Gross per 24 hour  Intake    120 ml  Output    925 ml  Net   -805 ml   BP 148/71  Pulse 86  Temp(Src) 98 F (36.7 C) (Oral)  Resp 17  Ht 5' 4.96" (1.65 m)  Wt 57.201 kg (126 lb 1.7 oz)  BMI 21.01 kg/m2  SpO2 100%  General Appearance:    Alert, cooperative, no distress, appears stated age  Head:    Normocephalic, without obvious abnormality, atraumatic  Throat:   Lips, mucosa, and tongue normal; teeth and gums normal  Lungs:     Clear to auscultation bilaterally, respirations unlabored  Chest wall:    No tenderness or deformity  Heart:    Regular rate and rhythm, S1 and S2 normal, no murmur, rub   or gallop  Abdomen:     Soft, non-tender, normoactive bowel sounds,    no masses, no organomegaly  Extremities:   Extremities normal, atraumatic, no cyanosis or edema  Skin:   Skin  color, texture, turgor normal, no rashes or lesions      Lab Results:   Basic Metabolic Panel:  Recent Labs  02/22/14 1635 02/23/14 0835 02/24/14 0004  NA  --  140 136*  K  --  4.9 5.1  CL  --  105 102  CO2  --  20 18*  GLUCOSE  --  95 102*  BUN  --  62* 64*  CREATININE  --  4.67* 4.82*  CALCIUM  --  9.0 9.2  MG  --   --  2.1  PHOS 4.7* 4.8*  --    Liver Function Tests:  Recent Labs  02/21/14 1430 02/23/14 0835  AST 16  --   ALT 16  --   ALKPHOS 102  --   BILITOT <0.2*  --   PROT 6.5  --   ALBUMIN 2.9* 2.8*   No results found for this basename: LIPASE, AMYLASE,  in the last 72 hours No results found for this basename: AMMONIA,  in the last 72 hours CBC:  Recent Labs  02/21/14 1430  02/23/14 0835 02/24/14 0004  WBC 5.3  < > 6.5 4.9  NEUTROABS 3.8  --   --   --  HGB 7.9*  < > 9.2* 8.4*  HCT 24.5*  < > 27.5* 25.4*  MCV 86.3  < > 85.1 86.1  PLT 238  < > 241 214  < > = values in this interval not displayed. Cardiac Enzymes:  Recent Labs  02/21/14 2005 02/22/14 0039  TROPONINI <0.30 <0.30   CBG:  Recent Labs  02/23/14 0737 02/23/14 1149 02/23/14 1714 02/23/14 2035 02/24/14 0737 02/24/14 1246  GLUCAP 80 157* 72 183* 74 163*   Hemoglobin A1C:  Recent Labs  02/21/14 1729  HGBA1C 6.3*   Anemia Panel:  Recent Labs  02/22/14 0039  FERRITIN 11*  TIBC 276  IRON 60  RETICCTPCT 0.6   Coagulation:  Recent Labs  02/21/14 1430  LABPROT 13.7  INR 1.05     Urinalysis:  Recent Labs  02/21/14 1744 02/21/14 1851  COLORURINE YELLOW YELLOW  LABSPEC 1.012 1.011  PHURINE 6.0 6.0  GLUCOSEU NEGATIVE NEGATIVE  HGBUR TRACE* TRACE*  BILIRUBINUR NEGATIVE NEGATIVE  KETONESUR NEGATIVE NEGATIVE  PROTEINUR 100* 100*  UROBILINOGEN 0.2 0.2  NITRITE NEGATIVE NEGATIVE  LEUKOCYTESUR NEGATIVE NEGATIVE     Micro Results: No results found for this or any previous visit (from the past 240 hour(s)). Studies/Results: No results  found. Medications: I have reviewed the patient's current medications. Scheduled Meds: . amLODipine  10 mg Oral Daily  . calcitRIOL  0.25 mcg Oral Daily  . carvedilol  6.25 mg Oral BID WC  . heparin  5,000 Units Subcutaneous 3 times per day  . insulin aspart  0-9 Units Subcutaneous TID WC  . sodium chloride  3 mL Intravenous Q12H   Continuous Infusions:  PRN Meds:.ondansetron (ZOFRAN) IV, ondansetron  Assessment/Plan:  Brian Fuller is a 63 yo male with PMH of hypertension, diabetes and glaucoma with an unclear PMH who has anemia and ultrasound and laboratory results consistent with end stage renal disease.  Active Problems:   Anemia   Glaucoma   Diabetes   Acute renal failure  Renal failure- Likely chronic given long history of diabetes and hypertension.S/p fluid bolus and subsequent lasix diuresis given lower extremity edema. Creatinine has been stable in the mid 4s with GFR<15 (stage 5 ESRD). Protein to creatine ratio was 3.8, phos 4.7, PTH 306.  Ultrasound showed no evidence of obstructive uropathy and echogenicity suggested of chronic renal disease. Hepatitis and autoimmune workup negative. No acute need for HD, but needs close f/u given severity of renal disease.  - Renal consult, Appreciated recommendations - Continue to trend daily metabolic panel. - Vein mapping today for future fistula. No vascular consult necessary at this time. - Take 1000 units of vitamin D daily on discharge - sevelamer or fosrenol for secondary hyperparathyroidism - Avoid nephrotoxic agents - f/u on 24 hour urine studies. - renal diet  Normocytic anemia- Hgb 7 on admission and has range 8.4-9.2 since them. Most likely secondary to chronic renal failure with possible iron deficiency component.  MCV of 86 with retic count of 0.6% with smear showing elliptocytes. S/p one unit of pRBCs.  LDH only mildly elevated.  Pt has ferritin of 11 but other iron studies are normal. Trops neg and CXR showed  cardiomegaly but no pulmonary edema. Pt started ferraheme therapy on 7/09.  - erythropoietin level pending - continue ferraheme therapy. Started 7/07. - trend daily CBC. - Continue telemetry. - biweekly darbepoetin therapy - fecal occult blood test pending.  Hypertension- systolic in 0000000 today which is  down from >200 yesterday on admission. S/p one dose  of hydralazine 10 mg on 7/6. Dc'd home furosemide and HCTZ due to renal failure. - hydralazine 10 mg PRN for systolic [id number] diastolic [id number].  - continue on home amlodipine 10mg  - started carvedilol 6.25 mg BID   Diabetes- 25 year history. Last A1C was 6.3 Currently on SSI.  - d/c metformin due to elevated creatinine.  -will stop gabapentin as only minimally helpful and causing nausea. - As A1C well controlled at 6.3 will not start insulin currently. If diabetes worsens can consider insulin at that point   Dispo: Floor status.  Anticipated discharge in approximately 1 day(s).   The patient does have a current PCP (Dr. York Ram) and does need an [facility] hospital follow-up appointment after discharge in the internal medicine clinic with Dr. Arcelia Jew or another resident. Will give patient info on obtaining charity care.    .Services Needed at time of discharge: Y = Yes, Blank = No PT:   OT:   RN:   Equipment:   Other:     LOS: 3 days   Azucena Cecil, Med Student 02/24/2014, 1:33 PM

## 2014-02-24 NOTE — Progress Notes (Signed)
Per phlebotomy patient refused labs. " I am going home today."

## 2014-02-24 NOTE — Progress Notes (Signed)
Patient ID: Brian Fuller male   DOB: 01/06/1951 63 y.o.   MRN: [id number]  S: Pt is doing well this AM.  Denies any CP, SOB, N/V/D/C.  UOP: 1.3L yesterday.    O: Vital signs: BP 150/74  Pulse 78  Temp(Src) 97.8 F (36.6 C) (Oral)  Resp 18  Ht 5' 4.96" (1.65 m)  Wt 126 lb 1.7 oz (57.201 kg)  BMI 21.01 kg/m2  SpO2 99%  Intake/Output: I/O last 3 completed shifts: In: 360 [P.O.:360] Out: 1325 [Urine:1325]  Weight change: Filed Weights   02/21/14 2039 02/22/14 2010 02/23/14 2039  Weight: 127 lb 13.9 oz (58 kg) 126 lb 1.7 oz (57.2 kg) 126 lb 1.7 oz (57.201 kg)    Physical Exam: Gen:NAD, sitting on the bed  CVS:RRR Resp:CTAB Abd:+BS, NT/ND Neuro: A&O x3, moving all extremties  Ext:No LE edema bilaterally, no UE edema   Recent Labs Lab 02/21/14 1430 02/21/14 1501 02/22/14 0039 02/22/14 1635 02/23/14 0835 02/24/14 0004  NA 139 140 140  --  140 136*  K 5.6* 5.4* 5.0  --  4.9 5.1  CL 106 112 106  --  105 102  CO2 18*  --  19  --  20 18*  GLUCOSE 100* 91 147*  --  95 102*  BUN 59* 65* 60*  --  62* 64*  CREATININE 4.41* 4.70* 4.40*  --  4.67* 4.82*  ALBUMIN 2.9*  --   --   --  2.8*  --   CALCIUM 8.7  --  8.7  --  9.0 9.2  PHOS  --   --   --  4.7* 4.8*  --   AST 16  --   --   --   --   --   ALT 16  --   --   --   --   --     Liver Function Tests:  Recent Labs Lab 02/21/14 1430 02/23/14 0835  AST 16  --   ALT 16  --   ALKPHOS 102  --   BILITOT <0.2*  --   PROT 6.5  --   ALBUMIN 2.9* 2.8*   No results found for this basename: LIPASE, AMYLASE,  in the last 168 hours No results found for this basename: AMMONIA,  in the last 168 hours  CBC:  Recent Labs Lab 02/21/14 1430  02/22/14 0039 02/23/14 0835 02/24/14 0004  WBC 5.3  --  4.9 6.5 4.9  NEUTROABS 3.8  --   --   --   --   HGB 7.9*  < > 8.8* 9.2* 8.4*  HCT 24.5*  < > 25.9* 27.5* 25.4*  MCV 86.3  --  86.6 85.1 86.1  PLT 238  --  215 241 214  < > = values in this interval not  displayed.  Cardiac Enzymes:  Recent Labs Lab 02/21/14 2005 02/22/14 0039  TROPONINI <0.30 <0.30    CBG:  Recent Labs Lab 02/23/14 0737 02/23/14 1149 02/23/14 1714 02/23/14 2035 02/24/14 0737  GLUCAP 80 157* 72 183* 74    Iron Studies:   Recent Labs  02/22/14 0039  IRON 60  TIBC 276  FERRITIN 11*    Studies/Results: No results found. Marland Kitchen amLODipine  10 mg Oral Daily  . heparin  5,000 Units Subcutaneous 3 times per day  . insulin aspart  0-9 Units Subcutaneous TID WC  . sodium chloride  3 mL Intravenous Q12H    BMET:    Component Value Date/Time   NA  136* 02/24/2014 0004   K 5.1 02/24/2014 0004   CL 102 02/24/2014 0004   CO2 18* 02/24/2014 0004   GLUCOSE 102* 02/24/2014 0004   BUN 64* 02/24/2014 0004   CREATININE 4.82* 02/24/2014 0004   CALCIUM 9.2 02/24/2014 0004   GFRNONAA 12* 02/24/2014 0004   GFRAA 14* 02/24/2014 0004    CBC:    Component Value Date/Time   WBC 4.9 02/24/2014 0004   RBC 2.95* 02/24/2014 0004   RBC 3.03* 02/22/2014 0039   HGB 8.4* 02/24/2014 0004   HCT 25.4* 02/24/2014 0004   PLT 214 02/24/2014 0004   MCV 86.1 02/24/2014 0004   MCH 28.5 02/24/2014 0004   MCHC 33.1 02/24/2014 0004   RDW 14.2 02/24/2014 0004   LYMPHSABS 1.1 02/21/2014 1430   MONOABS 0.3 02/21/2014 1430   EOSABS 0.2 02/21/2014 1430   BASOSABS 0.0 02/21/2014 1430     Assessment/Plan:  1. AKI vs.CKD- Stable, Cr 4.82/BUN 64 this AM, good UOP,  iPTH 306.3.  -vein mapping pending -continue strict I/O, daily weights  -renal panel to monitor electolytes  -avoid nephrotoxic agents    2. Metabolic abnormalities- no acute issues, but continue to watch bicarb level   3. Secondary hyperparathyroidism- would add sevelamer or fosrenol   3. HTN- stable  -continue current meds  4. Anemia- 9.2-->8.4 this AM, s/p 1 unit of prbcs, no active bleeding, iron low, anemia panel wnl; ferahmeme given    5. DM- mgmt per primary  6. VitD deficiency- replete    Jones Bales, MD Internal Medicine Teaching  Service, PGY-2

## 2014-02-24 NOTE — Plan of Care (Addendum)
Problem: Food- and Nutrition-Related Knowledge Deficit (NB-1.1) Goal: Nutrition education Formal process to instruct or train a patient/client in a skill or to impart knowledge to help patients/clients voluntarily manage or modify food choices and eating behavior to maintain or improve health. Outcome: Completed/Met Date Met:  02/24/14 Nutrition Education Note  RD consulted for Renal Education. RD completed diet education with help of in-person Spanish interpreter, Graciella. Provided Chronic Kidney Disease Pyramid and Low Sodium Nutrition Therapy information in Spanish to patient/family. Reviewed food groups and provided written recommended serving sizes specifically determined for patient's current nutritional status.   Explained why diet restrictions are needed and provided lists of foods to limit/avoid that are high potassium, sodium, and phosphorus. Provided specific recommendations on safer alternatives of these foods. Strongly encouraged compliance of this diet.   Expect good compliance.  Body mass index is 21.01 kg/(m^2). Pt meets criteria for normal weight based on current BMI.  Current diet order is Heart Healthy, patient is consuming approximately 100% of meals at this time. Labs and medications reviewed. No further nutrition interventions warranted at this time. RD contact information provided. If additional nutrition issues arise, please re-consult RD.  Inda Coke MS, RD, LDN Inpatient Registered Dietitian Pager: [phone] After-hours pager: [phone]

## 2014-02-24 NOTE — Progress Notes (Signed)
I have personally seen and examined this patient and agree with the assessment/plan as outlined above by Gordy Levan MD (PGY2). Patient with chronic kidney disease stage IV that appears to be from underlying diabetes/hypertension. Workup so far not revealing of obstructive pathology/hepatitis/HIV/lupus but has an isolated low C3. Anticipated to get a vein mapping prior to discharge today.  I have set him up for renal follow up with me in the office on 03/18/14 at 8:45 AM. Can get a permanent hemodialysis access placed as an outpatient.  Reygan Heagle K.,MD 02/24/2014 10:35 AM

## 2014-02-24 NOTE — Progress Notes (Signed)
Patient ID: Brian Fuller, male   DOB: 08-Nov-1950, 63 y.o.   MRN: [id number] Attending physician note: Patient examined together with resident physician Dr. Lottie Mussel. I agree with his assessment and management plan as documented above. 24-hour urine for creatinine clearance, total protein, and immunofixation electrophoresis in progress. The patient will have vein mapping done in anticipation of eventual need for dialysis access. If otherwise stable, discharge planning for tomorrow.

## 2014-02-24 NOTE — Progress Notes (Signed)
Interpreter Lesle Chris for [place]

## 2014-02-24 NOTE — Progress Notes (Signed)
I have seen the patient and reviewed the daily progress note by Katherene Ponto MS 3 and discussed the care of the patient with them.  See below for documentation of my findings, assessment, and plans.  Subjective: No overnight events. He has family and friends at this bedside who he prefers as his interpreters. Denies chest pain, SOB, or abdominal pain. Underwent vein mapping this morning. He agrees with staying one more day for 24h urine collection.   Objective: Vital signs in last 24 hours: Filed Vitals:   02/23/14 1716 02/23/14 2039 02/24/14 0443 02/24/14 1007  BP: 153/66 163/75 150/74 148/71  Pulse: 84 81 78 86  Temp: 98 F (36.7 C) 98.1 F (36.7 C) 97.8 F (36.6 C) 98 F (36.7 C)  TempSrc: Oral Oral Oral Oral  Resp: 18 18 18 17   Height:      Weight:  126 lb 1.7 oz (57.201 kg)    SpO2: 94% 99% 99% 100%   Weight change: 0.1 oz (0.001 kg)  Intake/Output Summary (Last 24 hours) at 02/24/14 1535 Last data filed at 02/24/14 0452  Gross per 24 hour  Intake    120 ml  Output    925 ml  Net   -805 ml   General: resting in bed, in NAD, wife and two of his friends at this bedside. Conversing in [place] only.   HEENT: PERRL, EOMI, no scleral icterus  Cardiac: RRR, no rubs, murmurs or gallops  Pulm: clear to auscultation bilaterally with no respiratory distress  Abd: soft, nontender, nondistended, BS present  Ext: warm and well perfused, no pedal edema  Neuro: alert and oriented X3, moves all extremities voluntairly  Lab Results: Reviewed and documented in Electronic Record Micro Results: Reviewed and documented in Electronic Record Studies/Results: Reviewed and documented in Electronic Record Medications: I have reviewed the patient's current medications. Scheduled Meds: . amLODipine  10 mg Oral Daily  . calcitRIOL  0.25 mcg Oral Daily  . carvedilol  6.25 mg Oral BID WC  . heparin  5,000 Units Subcutaneous 3 times per day  . insulin aspart  0-9 Units Subcutaneous TID  WC  . sodium chloride  3 mL Intravenous Q12H   Continuous Infusions:  PRN Meds:.ondansetron (ZOFRAN) IV, ondansetron Assessment/Plan: 63 year old man with PMH of HTN, diet-controlled DM2, and glaucoma presenting with incidental finding of renal dysfunction (ARF v CKD) with creatinine to 4.   Acute renal failure versus CKD stage 4: Likely CKD stage 4 due to underlying diabetes/hypertension. Although he had been seen by Dr. York Ram for the past 5 years or so, he has not had BMET or creatinine trending due to financial difficulties and concerns about lab costs. His baseline creatinine is unknown. On presentation his Cr was 4.3. He appeared volume overloaded, making prerenal azotemia unlikely. Renal US c/w chronic renal disease, spot protein/Cr of 3.86 c/w nephropathy, FUrea indicating likely intrarenal pathology, Urine micro showed tubular casts consistent with ATN. HIV/lupus/hepatitis/UDS all negative but has isolated low Complement 3. No need for emergent dialysis at this time but needs to establish care with Nephrology. Underwent vein mapping today and stable for discharge once 24 hr urine protein/creatinine collected.  -Nephrology following, appreciate recommendations and arrangement of outpatient follow up  -24 hr urine collection for protein and creatinine  Acute vs Chronic Anemia: Pt presented from PCP office with Hgb of 7.0 pt was asymptomatic and doesn't have a known baseline. Pt received 1 PRBCs and Hgb 8.8. FOBT negative. Hemolysis labs essentially negative  and blood smear showed elliptocytes. Trops neg and CXR showed cardiomegaly but no pulmonary edema. Pt received ferraheme dose on 7/7. Anemia panel including Fe studies: reveled normal Fe studies but low ferritin (indicating low Fe stores) pt may have mixed picture of chronic disease and Fe deficiency anemia  -baseline erythropoietin level pending if CKD is etiology  - trend Hgb   HTN: BP elevated today, at 158/81.  - Continue  amlodipine 10mg  qd  - Start Coreg 6.25mg  BID  DM: HgbA1c 6.3% on diet control alone. -hold metformin and d/c on discharge given poor GFR will need insulin outpt  -SSI sensitive   Dispo: Disposition is deferred at this time, awaiting improvement of current medical problems.  Anticipated discharge in approximately 1-2 day(s).   The patient does have a current PCP, Dr. York Ram and does not need an [facility] hospital follow-up appointment after discharge. He may benefit from referral to the [place] and from the [employer].   The patient does not have transportation limitations that hinder transportation to clinic appointments.  .Services Needed at time of discharge: Y = Yes, Blank = No PT:   OT:   RN:   Equipment:   Other:     LOS: 3 days   Blain Pais, MD PGY-3 ITMS 02/24/2014, 3:35 PM

## 2014-02-24 NOTE — Progress Notes (Signed)
VASCULAR LAB PRELIMINARY  PRELIMINARY  PRELIMINARY  PRELIMINARY  Right  Upper Extremity Vein Map    Cephalic  Segment Diameter Depth Comment  1. Axilla 3.16 mm mm   2. Mid upper arm 1.83 mm mm   3. Above AC 1.75 mm mm   4. In AC 3.07 mm mm   5. Below AC mm mm IV  6. Mid forearm 0.2 mm mm   7. Wrist 2.58 mm mm    mm mm    mm mm    mm mm    Basilic  Segment Diameter Depth Comment  1. Axilla 2.08 mm 5.32 mm   2. Mid upper arm 2.29 mm 5 mm   3. Above AC 2.33 mm 3.86 mm   4. In AC 1.92 mm 3.86 mm   5. Below AC 1.37 mm mm   6. Mid forearm mm mm   7. Wrist mm mm    mm mm    mm mm    mm mm     Left Upper Extremity Vein Map    Cephalic  Segment Diameter Depth Comment  1. Axilla 2.66 mm mm   2. Mid upper arm 2.12 mm mm   3. Above AC 2.87 mm mm   4. In AC 4.04 mm mm   5. Below AC mm mm IV  6. Mid forearm 2.16 mm mm   7. Wrist 2.33 mm mm    mm mm    mm mm    mm mm    Basilic  Segment Diameter Depth Comment  1. Axilla mm mm   2. Mid upper arm 3.12 mm 7.06 mm   3. Above AC 2.16 mm 2.99 mm   4. In AC 2.58 mm 3.33 mm Wraps around the side of the forearm  5. Below AC 2.45 mm mm   6. Mid forearm 2.53 mm mm   7. Wrist mm mm    mm mm    mm mm    mm mm     Izell Labat, RVT 02/24/2014, 3:15 PM

## 2014-02-25 DIAGNOSIS — H409 Unspecified glaucoma: Secondary | ICD-10-CM

## 2014-02-25 DIAGNOSIS — I152 Hypertension secondary to endocrine disorders: Secondary | ICD-10-CM

## 2014-02-25 DIAGNOSIS — I10 Essential (primary) hypertension: Secondary | ICD-10-CM

## 2014-02-25 DIAGNOSIS — N184 Chronic kidney disease, stage 4 (severe): Secondary | ICD-10-CM

## 2014-02-25 LAB — CREATININE CLEARANCE, URINE, 24 HOUR
COLLECTION INTERVAL-CRCL: 24 h
CREAT CLEAR: 16 mL/min — AB (ref 75–125)
CREATININE 24H UR: 1078 mg/d (ref 800–2000)
CREATININE, URINE: 35.36 mg/dL
Creatinine: 4.79 mg/dL — ABNORMAL HIGH (ref 0.50–1.35)
Urine Total Volume-CRCL: 3050 mL

## 2014-02-25 LAB — RENAL FUNCTION PANEL
Albumin: 2.8 g/dL — ABNORMAL LOW (ref 3.5–5.2)
Anion gap: 14 (ref 5–15)
BUN: 60 mg/dL — AB (ref 6–23)
CHLORIDE: 100 meq/L (ref 96–112)
CO2: 20 meq/L (ref 19–32)
Calcium: 9 mg/dL (ref 8.4–10.5)
Creatinine, Ser: 4.79 mg/dL — ABNORMAL HIGH (ref 0.50–1.35)
GFR calc Af Amer: 14 mL/min — ABNORMAL LOW (ref >90)
GFR calc non Af Amer: 12 mL/min — ABNORMAL LOW (ref >90)
Glucose, Bld: 143 mg/dL — ABNORMAL HIGH (ref 70–99)
Phosphorus: 5.3 mg/dL — ABNORMAL HIGH (ref 2.3–4.6)
Potassium: 4.9 mEq/L (ref 3.7–5.3)
Sodium: 134 mEq/L — ABNORMAL LOW (ref 137–147)

## 2014-02-25 LAB — CBC
HEMATOCRIT: 26.3 % — AB (ref 39.0–52.0)
Hemoglobin: 8.9 g/dL — ABNORMAL LOW (ref 13.0–17.0)
MCH: 28.2 pg (ref 26.0–34.0)
MCHC: 33.8 g/dL (ref 30.0–36.0)
MCV: 83.2 fL (ref 78.0–100.0)
PLATELETS: 232 10*3/uL (ref 150–400)
RBC: 3.16 MIL/uL — AB (ref 4.22–5.81)
RDW: 13.8 % (ref 11.5–15.5)
WBC: 5 10*3/uL (ref 4.0–10.5)

## 2014-02-25 LAB — PROTEIN, URINE, 24 HOUR
Collection Interval-UPROT: 24 hours
Protein, 24H Urine: 2410 mg/d — ABNORMAL HIGH (ref 50–100)
Protein, Urine: 79 mg/dL
Urine Total Volume-UPROT: 3050 mL

## 2014-02-25 LAB — GLUCOSE, CAPILLARY
Glucose-Capillary: 92 mg/dL (ref 70–99)
Glucose-Capillary: 99 mg/dL (ref 70–99)

## 2014-02-25 MED ORDER — CARVEDILOL 6.25 MG PO TABS: 6.2500 mg | ORAL_TABLET | Freq: Two times a day (BID) | ORAL | Status: DC

## 2014-02-25 MED ORDER — CALCITRIOL 0.25 MCG PO CAPS: 0.2500 ug | ORAL_CAPSULE | Freq: Every day | ORAL | Status: DC

## 2014-02-25 MED ORDER — AMLODIPINE BESYLATE 10 MG PO TABS: 10.0000 mg | ORAL_TABLET | Freq: Every day | ORAL | Status: DC

## 2014-02-25 NOTE — Progress Notes (Signed)
Patient ID: Brian Fuller male   DOB: [birth year] 63 y.o.   MRN: [id number]  S: Pt is doing well this AM.  Denies any CP, SOB, N/V/D/C.  UOP: 934ml yesterday, weights 136 (admission)-->125 lbs today. Unfortunately, labs have not been drawn today.   O: Vital signs: BP 147/76  Pulse 77  Temp(Src) 98.5 F (36.9 C) (Oral)  Resp 18  Ht 5' 4.96" (1.65 m)  Wt 125 lb 3.5 oz (56.8 kg)  BMI 20.86 kg/m2  SpO2 97%  Intake/Output: I/O last 3 completed shifts: In: 120 [P.O.:120] Out: 925 [Urine:925]  Weight change: Filed Weights   02/22/14 2010 02/23/14 2039 02/24/14 2016  Weight: 126 lb 1.7 oz (57.2 kg) 126 lb 1.7 oz (57.201 kg) 125 lb 3.5 oz (56.8 kg)    Physical Exam: Gen:NAD, sitting on the bed  CVS:RRR Resp:CTAB Abd:+BS, NT/ND Neuro: A&O x3, moving all extremties  Ext:No LE edema bilaterally, no UE edema   Recent Labs Lab 02/21/14 1430 02/21/14 1501 02/22/14 0039 02/22/14 1635 02/23/14 0835 02/24/14 0004  NA 139 140 140  --  140 136*  K 5.6* 5.4* 5.0  --  4.9 5.1  CL 106 112 106  --  105 102  CO2 18*  --  19  --  20 18*  GLUCOSE 100* 91 147*  --  95 102*  BUN 59* 65* 60*  --  62* 64*  CREATININE 4.41* 4.70* 4.40*  --  4.67* 4.82*  ALBUMIN 2.9*  --   --   --  2.8*  --   CALCIUM 8.7  --  8.7  --  9.0 9.2  PHOS  --   --   --  4.7* 4.8*  --   AST 16  --   --   --   --   --   ALT 16  --   --   --   --   --     Liver Function Tests:  Recent Labs Lab 02/21/14 1430 02/23/14 0835  AST 16  --   ALT 16  --   ALKPHOS 102  --   BILITOT <0.2*  --   PROT 6.5  --   ALBUMIN 2.9* 2.8*   No results found for this basename: LIPASE, AMYLASE,  in the last 168 hours No results found for this basename: AMMONIA,  in the last 168 hours  CBC:  Recent Labs Lab 02/21/14 1430  02/22/14 0039 02/23/14 0835 02/24/14 0004  WBC 5.3  --  4.9 6.5 4.9  NEUTROABS 3.8  --   --   --   --   HGB 7.9*  < > 8.8* 9.2* 8.4*  HCT 24.5*  < > 25.9* 27.5* 25.4*  MCV 86.3  --  86.6 85.1  86.1  PLT 238  --  215 241 214  < > = values in this interval not displayed.  Cardiac Enzymes:  Recent Labs Lab 02/21/14 2005 02/22/14 0039  TROPONINI <0.30 <0.30    CBG:  Recent Labs Lab 02/24/14 0737 02/24/14 1246 02/24/14 1659 02/24/14 2025 02/25/14 0805  GLUCAP 74 163* 166* 197* 92    Iron Studies:  No results found for this basename: IRON, TIBC, TRANSFERRIN, FERRITIN,  in the last 72 hours  Studies/Results: No results found. Marland Kitchen amLODipine  10 mg Oral Daily  . calcitRIOL  0.25 mcg Oral Daily  . carvedilol  6.25 mg Oral BID WC  . heparin  5,000 Units Subcutaneous 3 times per day  . insulin aspart  0-9 Units Subcutaneous TID WC  . sodium chloride  3 mL Intravenous Q12H    BMET:    Component Value Date/Time   NA 136* 02/24/2014 0004   K 5.1 02/24/2014 0004   CL 102 02/24/2014 0004   CO2 18* 02/24/2014 0004   GLUCOSE 102* 02/24/2014 0004   BUN 64* 02/24/2014 0004   CREATININE 4.82* 02/24/2014 0004   CALCIUM 9.2 02/24/2014 0004   GFRNONAA 12* 02/24/2014 0004   GFRAA 14* 02/24/2014 0004    CBC:    Component Value Date/Time   WBC 4.9 02/24/2014 0004   RBC 2.95* 02/24/2014 0004   RBC 3.03* 02/22/2014 0039   HGB 8.4* 02/24/2014 0004   HCT 25.4* 02/24/2014 0004   PLT 214 02/24/2014 0004   MCV 86.1 02/24/2014 0004   MCH 28.5 02/24/2014 0004   MCHC 33.1 02/24/2014 0004   RDW 14.2 02/24/2014 0004   LYMPHSABS 1.1 02/21/2014 1430   MONOABS 0.3 02/21/2014 1430   EOSABS 0.2 02/21/2014 1430   BASOSABS 0.0 02/21/2014 1430     Assessment/Plan:  1. AKI vs.CKD- Stable, labs not drawn this AM, good UOP. Vein mapping completed yesterday.  -await labs today  -continue strict I/O, daily weights  -renal panel to monitor electolytes  -avoid nephrotoxic agents    2. Metabolic abnormalities- no acute issues, but continue to watch bicarb level   3. Secondary hyperparathyroidism- added calcitriol yesterday  4. HTN- stable, added carvedilol yesterday.  Continue amlodipine.  -continue current meds  4.  Anemia- 8.4-->?? this AM, s/p 1 unit of prbcs, no active bleeding, iron low, anemia panel wnl; ferahmeme given    5. DM- mgmt per primary  6. VitD deficiency- replete with 1000 Units VitD daily OTC   Jones Bales, MD Internal Medicine Teaching Service, PGY-2

## 2014-02-25 NOTE — Progress Notes (Signed)
Subjective: Brian Fuller. Patient has no complaints this morning. Denies chest pain, dyspnea, urinary symptoms. He had vein mapping study by vascular surgery yesterday. He is on board with the plan of going home this afternoon after finishing 24 hour urine collection.   Objective: Vital signs in last 24 hours: Filed Vitals:   02/24/14 1700 02/24/14 2016 02/25/14 0602 02/25/14 1017  BP: 158/81 159/80 147/76 172/79  Pulse: 79 78 77 75  Temp: 97.7 F (36.5 C) 97.7 F (36.5 C) 98.5 F (36.9 C) 98 F (36.7 C)  TempSrc: Oral   Oral  Resp: 18 17 18 17   Height:      Weight:  56.8 kg (125 lb 3.5 oz)    SpO2: 99% 98% 97% 99%   Weight change: -0.401 kg (-14.2 oz)  Intake/Output Summary (Last 24 hours) at 02/25/14 1214 Last data filed at 02/25/14 1144  Gross per 24 hour  Intake    243 ml  Output    775 ml  Net   -532 ml   BP 172/79  Pulse 75  Temp(Src) 98 F (36.7 C) (Oral)  Resp 17  Ht 5' 4.96" (1.65 m)  Wt 56.8 kg (125 lb 3.5 oz)  BMI 20.86 kg/m2  SpO2 99%  General Appearance:    Alert, cooperative, no distress, appears stated age  Head:    Normocephalic, without obvious abnormality, atraumatic  Throat:   Lips, mucosa, and tongue normal; teeth and gums normal  Lungs:     Clear to auscultation bilaterally, respirations unlabored  Chest wall:    No tenderness or deformity  Heart:    Regular rate and rhythm, S1 and S2 normal, no murmur, rub   or gallop  Abdomen:     Soft, non-tender, normoactive bowel sounds,    no masses, no organomegaly  Extremities:   Extremities normal, atraumatic, no cyanosis or edema  Skin:   Skin color, texture, turgor normal, no rashes or lesions      Lab Results:   Basic Metabolic Panel:  Recent Labs  02/23/14 0835 02/24/14 0004 02/25/14 1000  NA 140 136* 134*  K 4.9 5.1 4.9  CL 105 102 100  CO2 20 18* 20  GLUCOSE 95 102* 143*  BUN 62* 64* 60*  CREATININE 4.67* 4.82* 4.79*  CALCIUM 9.0 9.2 9.0  MG  --  2.1  --   PHOS 4.8*  --  5.3*    Liver Function Tests:  Recent Labs  02/23/14 0835 02/25/14 1000  ALBUMIN 2.8* 2.8*   No results found for this basename: LIPASE, AMYLASE,  in the last 72 hours No results found for this basename: AMMONIA,  in the last 72 hours CBC:  Recent Labs  02/24/14 0004 02/25/14 0700  WBC 4.9 5.0  HGB 8.4* 8.9*  HCT 25.4* 26.3*  MCV 86.1 83.2  PLT 214 232   CBG:  Recent Labs  02/24/14 0737 02/24/14 1246 02/24/14 1659 02/24/14 2025 02/25/14 0805 02/25/14 1204  GLUCAP 74 163* 166* 197* 92 99   Hemoglobin A1C: No results found for this basename: HGBA1C,  in the last 72 hours Anemia Panel: No results found for this basename: VITAMINB12, FOLATE, FERRITIN, TIBC, IRON, RETICCTPCT,  in the last 72 hours Coagulation: No results found for this basename: LABPROT, INR,  in the last 72 hours   Urinalysis: No results found for this basename: COLORURINE, APPERANCEUR, LABSPEC, PHURINE, GLUCOSEU, HGBUR, BILIRUBINUR, KETONESUR, PROTEINUR, UROBILINOGEN, NITRITE, LEUKOCYTESUR,  in the last 72 hours   Micro Results: No results  found for this or any previous visit (from the past 240 hour(s)). Studies/Results: No results found. Medications: I have reviewed the patient's current medications. Scheduled Meds: . amLODipine  10 mg Oral Daily  . calcitRIOL  0.25 mcg Oral Daily  . carvedilol  6.25 mg Oral BID WC  . heparin  5,000 Units Subcutaneous 3 times per day  . insulin aspart  0-9 Units Subcutaneous TID WC  . sodium chloride  3 mL Intravenous Q12H   Continuous Infusions:  PRN Meds:.ondansetron (ZOFRAN) IV, ondansetron  Assessment/Plan:  Gianny Rupe is a 63 yo male with PMH of hypertension, diabetes and glaucoma with an unclear PMH who has anemia and ultrasound and laboratory results consistent with end stage renal disease.  Principal Problem:   Chronic kidney disease (CKD), stage IV (severe) Active Problems:   Anemia   Glaucoma   Diabetes   Essential hypertension,  benign  Renal failure- Likely chronic given long history of diabetes and hypertension.S/p fluid bolus and subsequent lasix diuresis given lower extremity edema. Creatinine has been stable in the mid 4s with GFR<15 (stage 5 ESRD). Protein to creatine ratio was 3.8, phos 4.7, PTH 306.  Ultrasound showed no evidence of obstructive uropathy and echogenicity suggested of chronic renal disease. Hepatitis and autoimmune workup negative. No acute need for HD, but needs close f/u given severity of renal disease. Vein mapping completed. - Renal consult, Appreciated recommendations - Take 1000 units of vitamin D daily on discharge - start calcitriol on discharge  - Avoid nephrotoxic agents - f/u on 24 hour urine studies. - renal diet  Normocytic anemia- Hgb 7 on admission and has range 8.4-9.2 since them. Most likely secondary to chronic renal failure with possible iron deficiency component.  MCV of 86 with retic count of 0.6% with smear showing elliptocytes. S/p one unit of pRBCs.  LDH only mildly elevated and FOBT was negative.  Pt has ferritin of 11 but other iron studies are normal. Trops neg and CXR showed cardiomegaly but no pulmonary edema. Pt received ferraheme therapy on 7/07 and darbepoetin on 7/09. - follow CBC as outpatient - biweekly darbepoetin injections.    Hypertension- systolic in 0000000 today which is  down from >200 on admission. S/p one dose of hydralazine 10 mg on 7/6. Dc'd home furosemide and HCTZ due to renal failure. - hydralazine 10 mg PRN for systolic [id number] diastolic [id number].  - continue on home amlodipine 10mg  - continue  carvedilol 6.25 mg BID started 7/10   Diabetes- 25 year history. Last A1C was 6.3 Currently on SSI.  D/c'd metformin due to elevated creatinine. As A1C well controlled at 6.3 will not start insulin currently. If diabetes worsens can consider insulin at that point   Dispo: Will go home today.   The patient does have a current PCP (Dr. York Ram) and does  need an [facility] hospital follow-up appointment after discharge in the internal medicine clinic with Dr. Posey Pronto on 7/24.   Marland KitchenServices Needed at time of discharge: Y = Yes, Blank = No PT:   OT:   RN:   Equipment:   Other:     LOS: 4 days   Azucena Cecil, Med Student 02/25/2014, 12:14 PM

## 2014-02-25 NOTE — Progress Notes (Signed)
I have personally seen and examined this patient and agree with the assessment/plan as outlined above by Gordy Levan MD. No BMET resulted from this AM---may possibly DC home today to f/u with me as scheduled. Will refer to vascular surgery as an out-patient for HD access  Taiwo Fish K.,MD 02/25/2014 11:25 AM

## 2014-02-25 NOTE — Progress Notes (Signed)
I have seen the patient and reviewed the daily progress note by Katherene Ponto MS 3 and discussed the care of the patient with them.  See below for documentation of my findings, assessment, and plans.  Subjective: NAEON. Pt doing well this AM. No complaints.    Objective: Vital signs in last 24 hours: Filed Vitals:   02/24/14 1700 02/24/14 2016 02/25/14 0602 02/25/14 1017  BP: 158/81 159/80 147/76 172/79  Pulse: 79 78 77 75  Temp: 97.7 F (36.5 C) 97.7 F (36.5 C) 98.5 F (36.9 C) 98 F (36.7 C)  TempSrc: Oral   Oral  Resp: 18 17 18 17   Height:      Weight:  125 lb 3.5 oz (56.8 kg)    SpO2: 99% 98% 97% 99%   Weight change: -14.2 oz (-0.401 kg)  Intake/Output Summary (Last 24 hours) at 02/25/14 1140 Last data filed at 02/25/14 1136  Gross per 24 hour  Intake    243 ml  Output    300 ml  Net    -57 ml   General: resting in bed, in NAD,  HEENT: no scleral icterus  Cardiac: RRR, no rubs, murmurs or gallops  Pulm: clear to auscultation bilaterally with no crackles/wheezes/rhonchi,  Abd: soft, nontender, nondistended, BS present  Ext: warm and well perfused, no pedal edema  Neuro: alert and oriented X3, moves all extremities independently and voluntairly  Lab Results: Reviewed and documented in Electronic Record Micro Results: Reviewed and documented in Electronic Record Studies/Results: Reviewed and documented in Electronic Record Medications: I have reviewed the patient's current medications. Scheduled Meds: . amLODipine  10 mg Oral Daily  . calcitRIOL  0.25 mcg Oral Daily  . carvedilol  6.25 mg Oral BID WC  . heparin  5,000 Units Subcutaneous 3 times per day  . insulin aspart  0-9 Units Subcutaneous TID WC  . sodium chloride  3 mL Intravenous Q12H   Continuous Infusions:  PRN Meds:.ondansetron (ZOFRAN) IV, ondansetron Assessment/Plan: 63 year old man with PMH of HTN, diet-controlled DM2, and glaucoma presenting with incidental finding of renal dysfunction  (ARF v CKD) with creatinine to 4.   Acute renal failure versus CKD stage 4: Likely CKD stage 4 due to underlying diabetes/hypertension. Although he had been seen by Dr. York Ram for the past 5 years or so, he has not had BMET or creatinine trending due to financial difficulties and concerns about lab costs. His baseline creatinine is unknown. On presentation his Cr was 4.3. He appeared volume overloaded, making prerenal azotemia unlikely. Renal US c/w chronic renal disease, spot protein/Cr of 3.86 c/w nephropathy, FUrea indicating likely intrarenal pathology, Urine micro showed tubular casts consistent with ATN. HIV/lupus/hepatitis/UDS all negative but has isolated low Complement 3. No need for emergent dialysis at this time but needs to establish care with Nephrology. Underwent vein mapping 02/24/14 and stable for discharge once 24 hr urine protein/creatinine collected.  -Nephrology following, appreciate recommendations and arrangement of outpatient follow up  -24 hr urine collection for protein and creatinine to be completed on 7/10  Acute vs Chronic Anemia: Pt presented from PCP office with Hgb of 7.0 pt was asymptomatic and doesn't have a known baseline. Pt received 1 PRBCs and Hgb 8.8. FOBT negative. Hemolysis labs essentially negative and blood smear showed elliptocytes. Trops neg and CXR showed cardiomegaly but no pulmonary edema. Pt received ferraheme dose on 7/7. Anemia panel including Fe studies: reveled normal Fe studies but low ferritin (indicating low Fe stores) pt may have  mixed picture of chronic disease and Fe deficiency anemia  -baseline erythropoietin level 7.0 and pt received x1 darbopoeitin on 7/9 - trend Hgb   HTN: BP still slightly elevated this AM but was normal overnight. Anxiety component maybe involved.   - Continue amlodipine 10mg  qd  - Cont Coreg 6.25mg  BID  DM: HgbA1c 6.3% on diet control alone. -hold metformin and d/c on discharge given poor GFR  -maybe able to  manage with diet and lifestyle modifications -SSI sensitive   Dispo: Disposition is deferred at this time, awaiting improvement of current medical problems.  Anticipated discharge in approximately today.   The patient does have a current PCP, Dr. York Ram and does not need an [facility] hospital follow-up appointment after discharge. He may benefit from referral to the [facility] and from the [facility].   The patient does not have transportation limitations that hinder transportation to clinic appointments.  .Services Needed at time of discharge: Y = Yes, Blank = No PT:   OT:   RN:   Equipment:   Other:     LOS: 4 days   Clinton Gallant, MD PGY-3 IMTS 02/25/2014, 11:40 AM

## 2014-02-25 NOTE — Progress Notes (Signed)
Pt discharged home per MD. Discharge instructions reviewed with pt and friend. Match letter provided with prescriptions. NSL discontinued x 2 with both caths intact. Pt ambulated out by choice declining wheelchair escort. Manya Silvas, RN

## 2014-02-25 NOTE — Discharge Summary (Signed)
Name: Brian Fuller MRN: [id number] DOB: 06/28/1951 63 y.o. PCP: No primary provider on file.  Date of Admission: 02/21/2014  2:21 PM Date of Discharge: 02/26/2014 Attending Physician: Dr. Beryle Beams  Discharge Diagnosis: Principal Problem:   Chronic kidney disease (CKD), stage IV (severe) Active Problems:   Anemia   Glaucoma   Diabetes   Essential hypertension, benign  Discharge Medications:   Medication List    STOP taking these medications       furosemide 20 MG tablet  Commonly known as:  LASIX     hydrochlorothiazide 25 MG tablet  Commonly known as:  HYDRODIURIL     metFORMIN 500 MG tablet  Commonly known as:  GLUCOPHAGE      TAKE these medications       amLODipine 10 MG tablet  Commonly known as:  NORVASC  Take 1 tablet (10 mg total) by mouth daily.     calcitRIOL 0.25 MCG capsule  Commonly known as:  ROCALTROL  Take 1 capsule (0.25 mcg total) by mouth daily.     carvedilol 6.25 MG tablet  Commonly known as:  COREG  Take 1 tablet (6.25 mg total) by mouth 2 (two) times daily with a meal.        Disposition and follow-up:   [name] was discharged from [facility] in good condition.  At the hospital follow up visit please address:  1.  Patient's CKD and diabetes management  2.  Labs / imaging needed at time of follow-up: [id number], metabolic panel and Hgb.  3.  Pending labs/ test needing follow-up: 24 hour urine collection  Follow-up Appointments: Follow-up Information   Follow up with Ulla Potash., MD On 03/18/2014. (8:45am)    Specialty:  Nephrology   Contact information:   [place] [address] [phone]       Follow up with Charlott Rakes, MD On 03/11/2014. (10:45am)    Specialty:  Internal Medicine   Contact information:   [place] [address] [phone]       Discharge Instructions: Discharge Instructions   Diet - low sodium heart healthy    Complete by:  As directed       Increase activity slowly    Complete by:  As directed            Consultations: Treatment Team:  Clayborne Dana. Posey Pronto, MD Nephrology   Procedures Performed:  Dg Chest 2 View  02/21/2014   CLINICAL DATA:  Anemia.  EXAM: CHEST  2 VIEW  COMPARISON:  No priors.  FINDINGS: Lung volumes are low. No consolidative airspace disease. No pleural effusions. No evidence of pulmonary edema. Heart size is moderately enlarged. Upper mediastinal contours are within normal limits.  IMPRESSION: 1. Cardiomegaly. 2. No radiographic evidence of acute cardiopulmonary disease.   Electronically Signed   By: Vinnie Langton M.D.   On: 02/21/2014 18:12   US Renal  02/22/2014   CLINICAL DATA:  Acute renal failure.  Diabetes  EXAM: RENAL/URINARY TRACT ULTRASOUND COMPLETE  COMPARISON:  None.  FINDINGS: Right Kidney:  Length: 10.8. Slight increase in parenchymal echogenicity. No mass or hydronephrosis visualized.  Left Kidney:  Length: 11.1 cm. Slight increase in parenchymal echogenicity. No mass or hydronephrosis visualized.  Bladder:  Appears normal for degree of bladder distention.  IMPRESSION: 1. No evidence for obstructive uropathy. 2. Mild increased parenchymal echogenicity suggestive of chronic medical renal disease.   Electronically Signed   By: Kerby Moors M.D.   On:  02/22/2014 08:14    Admission HPI: Mr. [name] is a 63yo man w/ PMHx of type 2 DM and glaucoma who presented to the ED after instructed by his PCP to go to the ER. Patient speaks only spanish and interview was translated through his family friend. The patient saw his PCP last week for clearance/bloodwork for his eye surgery for glaucoma. The patient was called by his PCP that his hemoglobin level was 7.0 and Cr 4.34, and that he should immediately go to the ER. Besides feeling a little weak, the patient denies fevers, chills, dizziness, CP, SOB, abdominal pain, N/V, melena, and hematochezia. He has no difficulties urinating. Patient was transfused with  1 unit of blood in the ED. FOBT was done that was negative.   Hospital Course by problem list: 1. Stage 4/5 CKD: Pt presented from PCP after pre-op labs with a Creatine on admission was 4.6 with a BUN of 59 from an unknown baseline. On admission patient received a bolus of normal saline. He received a 1 time dose of IV lasix, given his lower extremity edema after the fluid bolus. Renal ultrasound showed no hydronephrosis and echogenic kidneys consistent with chronic renal disease. His protein to creatine ratio was 3.8, PTH 306, and phosphorus 4.7. Nephrology was consulted and ruled out alternative causes of kidney disease including Hepatitis, HIV,and autoimmune mediated. A dietician spoke with the patient about approriate diet given his renal failure and he should continue on a renal diet after discharge. He was reminded not to take ibuprofen or other NSAIDS and his metformin and diuretic were d/c'd due to increased risk for lactic acidosis. Pt recieved renal vein mapping during his stay. He was started on calcitriol and should take 1000 units of vitamin D daily on discharge. Pt will need AV fistula placement as an outpt in preparation for HD.   2. Normocytic anemia: Hgb was 7.0 on admission with unknown baseline and patient was given 1 unit of pRBC in the ED. Patient was found to have a normocytic anemia with normal haptoglobin and only slightly elevated LDH. FOBT was negative. His Hgb has since ranged betwen 8.4 and 9.2 through the course of his admission.  Iron studies showed a ferritin of 11 but were within normal range otherwise. Erythropoietin is 7. The patient's anemia is likely a mixed, mainly 2/2 chronic kidney disease with an iron deficiency component. Patient received a dose of ferraheme and  darbepoetin while hospitalized.   3. Hypertension- Systolic was [id number] on admission and received one dose of hydralazine in the ED as well as IV lasix.  His home furosemide and HCTZ were d/c'd due to renal  failure and he was started on carvedilol 6.25 mg BID. He continue on his home amlodipine 10mg  through out admission. Pt then had good control with SBP in 140-160s.   4. Type 2 Diabetes- Patient has 25 year history of diabetes with last A1C of 6.3. His metformin was discontinued as he has CKD. His gabapentin was also stopped as it is not helping his neuropathy and causing nausea.  As A1C is well controlled currently, we will not start insulin currently and will leave that up to PCP in the future.   5. Glaucoma- Patient will hold off on his surgery until he his cleared by nephrology.  Discharge Vitals:   BP 172/79  Pulse 75  Temp(Src) 98 F (36.7 C) (Oral)  Resp 17  Ht 5' 4.96" (1.65 m)  Wt 125 lb 3.5 oz (56.8 kg)  BMI  20.86 kg/m2  SpO2 99% General: resting in bed, in NAD,  HEENT: no scleral icterus  Cardiac: RRR, no rubs, murmurs or gallops  Pulm: clear to auscultation bilaterally with no crackles/wheezes/rhonchi,  Abd: soft, nontender, nondistended, BS present  Ext: warm and well perfused, no pedal edema  Neuro: alert and oriented X3, moves all extremities independently and voluntairly  Discharge Labs:  Results for orders placed during the hospital encounter of 02/21/14 (from the past 24 hour(s))  GLUCOSE, CAPILLARY     Status: None   Collection Time    02/25/14 12:04 PM      Result Value Ref Range   Glucose-Capillary 99  70 - 99 mg/dL    Signed: Clinton Gallant, MD 02/26/2014, 10:39 AM    Services Ordered on Discharge: none Equipment Ordered on Discharge: none

## 2014-02-26 NOTE — Progress Notes (Signed)
   I have seen the patient and reviewed the daily progress note by Katherene Ponto MS 4 and discussed the care of the patient with them.  See my documentation for my findings, assessment, and plans.  Clinton Gallant, MD PGY-3 [phone]

## 2014-02-28 NOTE — Discharge Summary (Signed)
Attending Physician Discharge Note: I personally examined and interviewed this patient on the day of discharge in the company of a [employer]. Presenting problems, physical findings, laboratory and diagnostic tests reviewed and I attest to the accuracy of the above discharge summary by resident physican Dr. Clinton Gallant.  Murriel Hopper, MD, [place]  Hematology-Oncology/Internal Medicine

## 2014-03-01 LAB — IMMUNOFIXATION, URINE

## 2014-03-11 ENCOUNTER — Ambulatory Visit: Payer: Self-pay | Admitting: Internal Medicine

## 2014-03-24 ENCOUNTER — Ambulatory Visit: Payer: Self-pay | Attending: Internal Medicine

## 2014-04-08 ENCOUNTER — Ambulatory Visit: Payer: Self-pay | Attending: Internal Medicine | Admitting: Internal Medicine

## 2014-04-08 ENCOUNTER — Encounter: Payer: Self-pay | Admitting: Internal Medicine

## 2014-04-08 VITALS — BP 152/72 | HR 76 | Temp 97.8°F | Resp 16 | Wt 131.4 lb

## 2014-04-08 DIAGNOSIS — H409 Unspecified glaucoma: Secondary | ICD-10-CM | POA: Insufficient documentation

## 2014-04-08 DIAGNOSIS — D631 Anemia in chronic kidney disease: Secondary | ICD-10-CM | POA: Insufficient documentation

## 2014-04-08 DIAGNOSIS — E088 Diabetes mellitus due to underlying condition with unspecified complications: Secondary | ICD-10-CM

## 2014-04-08 DIAGNOSIS — I129 Hypertensive chronic kidney disease with stage 1 through stage 4 chronic kidney disease, or unspecified chronic kidney disease: Secondary | ICD-10-CM | POA: Insufficient documentation

## 2014-04-08 DIAGNOSIS — M25519 Pain in unspecified shoulder: Secondary | ICD-10-CM | POA: Insufficient documentation

## 2014-04-08 DIAGNOSIS — I10 Essential (primary) hypertension: Secondary | ICD-10-CM

## 2014-04-08 DIAGNOSIS — Z833 Family history of diabetes mellitus: Secondary | ICD-10-CM | POA: Insufficient documentation

## 2014-04-08 DIAGNOSIS — M25511 Pain in right shoulder: Secondary | ICD-10-CM

## 2014-04-08 DIAGNOSIS — Z139 Encounter for screening, unspecified: Secondary | ICD-10-CM

## 2014-04-08 DIAGNOSIS — N184 Chronic kidney disease, stage 4 (severe): Secondary | ICD-10-CM | POA: Insufficient documentation

## 2014-04-08 DIAGNOSIS — E138 Other specified diabetes mellitus with unspecified complications: Secondary | ICD-10-CM

## 2014-04-08 DIAGNOSIS — N039 Chronic nephritic syndrome with unspecified morphologic changes: Secondary | ICD-10-CM

## 2014-04-08 DIAGNOSIS — E139 Other specified diabetes mellitus without complications: Secondary | ICD-10-CM | POA: Insufficient documentation

## 2014-04-08 MED ORDER — TRAMADOL HCL 50 MG PO TABS
50.0000 mg | ORAL_TABLET | Freq: Three times a day (TID) | ORAL | Status: DC | PRN
Start: 2014-04-08 — End: 2014-11-10

## 2014-04-08 NOTE — Progress Notes (Signed)
Patient complains of right shoulder pain Bilateral foot swelling Was recently in the hospital for his HTN and renal failure

## 2014-04-08 NOTE — Progress Notes (Signed)
Patient Demographics  Brian Fuller, is a 63 y.o. male  ZN:[id number]  [id number]  DOB - 06/05/1951  CC:  Chief Complaint  Patient presents with  . Shoulder Pain       HPI: Brian Fuller is a 63 y.o. 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 His blood pressure is borderline elevated patient denies any headache dizziness chest and shortness of breath, he is already following up with his nephrologist, is complaining of right her shoulder pain for the last 2-3 weeks denies any fall or trauma. Patient complains of feeling cold sometimes. Denies any change in bowel habits. Patient has No headache, No chest pain, No abdominal pain - No Nausea, No new weakness tingling or numbness, No Cough - SOB.  No Known Allergies Past Medical History  Diagnosis Date  . Diabetes mellitus without complication   . Glaucoma    Current Outpatient Prescriptions on File Prior to Visit  Medication Sig Dispense Refill  . amLODipine (NORVASC) 10 MG tablet Take 1 tablet (10 mg total) by mouth daily.  30 tablet  3  . calcitRIOL (ROCALTROL) 0.25 MCG capsule Take 1 capsule (0.25 mcg total) by mouth daily.  30 capsule  3  . carvedilol (COREG) 6.25 MG tablet Take 1 tablet (6.25 mg total) by mouth 2 (two) times daily with a meal.  60 tablet  3  . furosemide (LASIX) 20 MG tablet Take 20 mg by mouth daily.      . metFORMIN (GLUCOPHAGE) 500 MG tablet Take 500 mg by mouth daily.       No current facility-administered medications on file prior to visit.   Family History    Problem Relation Age of Onset  . Diabetes Mother   . Diabetes Brother    History   Social History  . Marital Status: Married    Spouse Name: N/A    Number of Children: N/A  . Years of Education: N/A   Occupational History  . Not on file.   Social History Main Topics  . Smoking status: Never Smoker   . Smokeless tobacco: Not on file  . Alcohol Use: No  . Drug Use: Not on file  . Sexual Activity: Not on file   Other Topics Concern  . Not on file   Social History Narrative  . No narrative on file    Review of Systems: Constitutional: Negative for fever, chills, diaphoresis, activity change, appetite change and fatigue. HENT: Negative for ear pain, nosebleeds, congestion, facial swelling, rhinorrhea, neck pain, neck stiffness and ear discharge.  Eyes: Negative for pain, discharge, redness, itching and visual disturbance. Respiratory: Negative for cough, choking, chest tightness, shortness of breath, wheezing and stridor.  Cardiovascular: Negative for chest pain, palpitations and leg swelling. Gastrointestinal: Negative for abdominal distention. Genitourinary: Negative for dysuria, urgency, frequency, hematuria, flank pain, decreased urine volume, difficulty urinating and dyspareunia.  Musculoskeletal: Negative for back pain, joint swelling, arthralgia and gait problem. Neurological: Negative for dizziness, tremors, seizures, syncope, facial asymmetry, speech difficulty, weakness, light-headedness, numbness and headaches.  Hematological: Negative for adenopathy. Does not bruise/bleed easily. Psychiatric/Behavioral: Negative for hallucinations, behavioral problems, confusion, dysphoric mood,  decreased concentration and agitation.    Objective:   Filed Vitals:   04/08/14 1156  BP: 152/72  Pulse: 76  Temp: 97.8 F (36.6 C)  Resp: 16    Physical Exam: Constitutional: Patient appears well-developed and well-nourished. No distress. HENT: Normocephalic, atraumatic,  External right and left ear normal. Oropharynx is clear and moist.  Eyes: Conjunctivae and EOM are normal. PERRLA, no scleral icterus. Neck: Normal ROM. Neck supple. No JVD. No tracheal deviation. No thyromegaly. CVS: RRR, S1/S2 +, no murmurs, no gallops, no carotid bruit.  Pulmonary: Effort and breath sounds normal, no stridor, rhonchi, wheezes, rales.  Abdominal: Soft. BS +, no distension, tenderness, rebound or guarding.  Musculoskeletal: Normal range of motion. No edema and no tenderness. Right shoulder tenderness anteriorly. Good range of motion.  Neuro: Alert. Normal reflexes, muscle tone coordination. No cranial nerve deficit. Skin: Skin is warm and dry. No rash noted. Not diaphoretic. No erythema. No pallor. Psychiatric: Normal mood and affect. Behavior, judgment, thought content normal.  Lab Results  Component Value Date   WBC 5.0 02/25/2014   HGB 8.9* 02/25/2014   HCT 26.3* 02/25/2014   MCV 83.2 02/25/2014   PLT 232 02/25/2014   Lab Results  Component Value Date   CREATININE 4.79* 02/25/2014   BUN 60* 02/25/2014   NA 134* 02/25/2014   K 4.9 02/25/2014   CL 100 02/25/2014   CO2 20 02/25/2014    Lab Results  Component Value Date   HGBA1C 6.3* 02/21/2014   Lipid Panel  No results found for this basename: chol, trig, hdl, cholhdl, vldl, ldlcalc       Assessment and plan:   1. Essential hypertension, benign Blood pressure is borderline elevated, advise patient for DASH diet continue with Coreg her and amlodipine.  2. Chronic kidney disease (CKD), stage IV (severe)  Patient following up with the nephrologist. 3. Glaucoma  patient following up with ophthalmologist.  4. Diabetes mellitus due to underlying condition with complications  currently diet controlled her last A1c was 6.3%. 5. Right shoulder pain  I have ordered x-ray, advise patient to avoid taking NSAIDs over-the-counter. - traMADol (ULTRAM) 50 MG tablet; Take 1 tablet (50 mg total) by mouth every 8 (eight) hours  as needed for moderate pain.  Dispense: 30 tablet; Refill: 0 - DG Shoulder Right; Future  6. Screening  we'll do baseline blood work.  - CBC with Differential - Vit D  25 hydroxy (rtn osteoporosis monitoring) - TSH    Return in about 3 months (around 07/09/2014) for hypertension, diabetes.  Lorayne Marek, MD

## 2014-04-08 NOTE — Patient Instructions (Addendum)
Plan de alimentacin DASH (DASH Eating Plan) DASH es la sigla en ingls de "Enfoques Alimentarios para Detener la Hipertensin". El plan de alimentacin DASH ha demostrado bajar la presin arterial elevada (hipertensin). Los beneficios adicionales para la salud pueden incluir la disminucin del riesgo de diabetes mellitus tipo2, enfermedades cardacas e ictus. Este plan tambin puede ayudar a Horticulturist, commercial. QU DEBO SABER ACERCA DEL PLAN DE ALIMENTACIN DASH? Para el plan de alimentacin DASH, seguir las siguientes pautas generales:  Elija los alimentos con un valor porcentual diario de sodio de menos del 5% (segn figura en la etiqueta del alimento).  Use hierbas o aderezos sin sal, en lugar de sal de mesa o sal marina.  Consulte al mdico o farmacutico antes de usar sustitutos de la sal.  Coma productos con bajo contenido de sodio, cuya etiqueta suele decir "bajo contenido de sodio" o "sin agregado de sal".  Coma alimentos frescos.  Coma ms verduras, frutas y productos lcteos con bajo contenido de [place].  Elija los cereales integrales. Busque la palabra "integral" en Equities trader de la lista de ingredientes.  Elija el pescado y el pollo o el pavo sin piel ms a menudo que las carnes rojas. Limite el consumo de pescado, carne de ave y carne a 6onzas (170g) por Training and development officer.  Limite el consumo de dulces, postres, azcares y bebidas azucaradas.  Elija las grasas saludables para el corazn.  Limite el consumo de queso a 1onza (28g) por Training and development officer.  Consuma ms comida casera y menos de restaurante, de buf y comida rpida.  Limite el consumo de alimentos fritos.  Cocine los alimentos utilizando mtodos que no sean la fritura.  Limite las verduras enlatadas. Si las consume, enjuguelas bien para disminuir el sodio.  Cuando coma en un restaurante, pida que preparen su comida con menos sal o, en lo posible, sin nada de sal. QU ALIMENTOS PUEDO COMER? Pida ayuda a un nutricionista para  conocer las necesidades calricas individuales. Cereales Pan de salvado o integral. Arroz integral. Pastas de salvado o integrales. Quinua, trigo burgol y cereales integrales. Cereales con bajo contenido de sodio. Tortillas de harina de maz o de salvado. Pan de maz integral. Galletas saladas integrales. Galletas con bajo contenido de [place]. Vegetales Verduras frescas o congeladas (crudas, al vapor, asadas o grilladas). Jugos de tomate y verduras con contenido bajo o reducido de sodio. Pasta y salsa de tomate con contenido bajo o [place]. Verduras enlatadas con bajo contenido de sodio o reducido de sodio.  Lambert Mody Lambert Mody frescas, en conserva (en su jugo natural) o frutas congeladas. Carnes y otros productos con protenas Carne de res molida (al 85% o ms [place]), carne de res de animales alimentados con pastos o carne de res sin la grasa. Pollo o pavo sin piel. Carne de pollo o de [place]. Cerdo sin la grasa. Todos los pescados y frutos de mar. Huevos. Porotos, guisantes o lentejas secos. Frutos secos y semillas sin sal. Frijoles enlatados sin sal. Lcteos Productos lcteos con bajo contenido de grasas, como [place] o al 1%, quesos reducidos en grasas o al 2%, ricota con bajo contenido de grasas o [employer], o yogur natural con bajo contenido de [place]. Quesos con contenido bajo o reducido de sodio. Grasas y Naval architect en barra que no contengan grasas trans. [place] y alios para ensaladas livianos o reducidos en grasas (reducidos en sodio). [place]. Aceites de crtamo, oliva o canola. [place] natural de man o almendra. Otros Palomitas de maz y pretzels sin sal.  Los artculos mencionados arriba pueden no ser [employer] de las bebidas o los alimentos recomendados. Comunquese con el nutricionista para conocer ms opciones. QU ALIMENTOS NO SE RECOMIENDAN? Cereales Pan blanco. Pastas blancas. Arroz blanco. Pan de maz refinado. Bagels y  croissants. Galletas saladas que contengan grasas trans. Vegetales Vegetales con crema o fritos. Verduras en [place]. Verduras enlatadas comunes. Pasta y salsa de tomate en lata comunes. Jugos comunes de tomate y de verduras. Lambert Mody Frutas secas. Fruta enlatada en almbar liviano o espeso. Jugo de frutas. Carnes y otros productos con protenas [place] de carne con Lobbyist. Costillas, alas de pollo, tocineta, salchicha, mortadela, salame, chinchulines, tocino, perros calientes, salchichas alemanas y embutidos envasados. Frutos secos y semillas con sal. Frijoles con sal en lata. Lcteos Leche entera o al 2%, crema, mezcla de [place] y crema, y queso crema. Yogur entero o endulzado. Quesos o queso azul con alto contenido de Physicist, medical. [place] no lcteas y coberturas batidas. Quesos procesados, quesos para untar o cuajadas. Condimentos Sal de cebolla y ajo, sal condimentada, sal de mesa y sal marina. Salsas en lata y envasadas. Salsa [place]. Salsa trtara. Salsa barbacoa. Salsa teriyaki. Salsa de soja, incluso la que tiene contenido reducido de [place]. Salsa de carne. Salsa de pescado. Salsa de [place]. Salsa rosada. Rbano picante. Ketchup y mostaza. Saborizantes y tiernizantes para carne. Caldo en cubitos. Salsa picante. Salsa tabasco. Adobos. Aderezos para tacos. Salsas. Grasas y aceites [place], [place] en barra, Sylvarena de [place], [place], [place] clarificada y Wendee Copp de tocino. Aceites de coco, de palmiste o de palma. Aderezos comunes para ensalada. Otros Pickles y [place]. Palomitas de maz y pretzels con sal. Los artculos mencionados arriba pueden no ser [employer] de las bebidas y los alimentos que se Higher education careers adviser. Comunquese con el nutricionista para obtener ms informacin. DNDE Dolan Amen MS INFORMACIN? [place], del Pulmn y de la Sangre (National Heart, Lung, and [place]):  [email or web address] Document Released: 07/25/2011 Document Revised: 12/20/2013 [facility] Patient Information 2015 [place], [place]. This information is not intended to replace advice given to you by your health care provider. Make sure you discuss any questions you have with your health care provider. La diabetes mellitus y los alimentos (Diabetes Mellitus and Food) Es importante que controle su nivel de azcar en la sangre (glucosa). El nivel de glucosa en sangre depende en gran medida de lo que usted come. [place] alimentos saludables en las cantidades [place] a lo largo del Training and development officer, aproximadamente a la misma hora [employer], lo ayudar a Chief Technology Officer su nivel de Multimedia programmer. Tambin puede ayudarlo a retrasar o Patent attorney de la diabetes mellitus. Comer de [employer] saludable incluso puede ayudarlo a Chartered loss adjuster de presin arterial y a Science writer o Theatre manager un peso saludable.  CMO PUEDEN AFECTARME LOS ALIMENTOS? Carbohidratos Los carbohidratos afectan el nivel de glucosa en sangre ms que cualquier otro tipo de alimento. El nutricionista lo ayudar a Teacher, adult education cuntos carbohidratos puede consumir en cada comida y ensearle a contarlos. El recuento de carbohidratos es importante para mantener la glucosa en sangre en un nivel saludable, en especial si utiliza insulina o toma determinados medicamentos para la diabetes mellitus. Alcohol El alcohol puede provocar disminuciones sbitas de la glucosa en sangre (hipoglucemia), en especial si utiliza insulina o toma determinados medicamentos para la diabetes mellitus. La hipoglucemia es una afeccin que puede poner en peligro la vida. Los sntomas de la hipoglucemia (somnolencia, mareos y Data processing manager) son similares a los sntomas de  haber consumido mucho alcohol.  Si el mdico lo autoriza a beber alcohol, hgalo con moderacin y siga estas pautas:  Las mujeres no deben beber ms de un trago por da, y los  hombres no deben beber ms de dos tragos por Training and development officer. Un trago es igual a:  12 onzas (355 ml) de cerveza  5 onzas de vino (150 ml) de vino  1,5onzas (91ml) de bebidas espirituosas  No beba con el estmago vaco.  Mantngase hidratado. Beba agua, gaseosas dietticas o t helado sin azcar.  Las gaseosas comunes, los jugos y otros refrescos podran contener muchos carbohidratos y se Civil Service fast streamer. QU ALIMENTOS NO SE RECOMIENDAN? Cuando haga las elecciones de alimentos, es importante que recuerde que todos los alimentos son distintos. Algunos tienen menos nutrientes que otros por porcin, aunque podran tener la misma cantidad de caloras o carbohidratos. Es difcil darle al cuerpo lo que necesita cuando consume alimentos con menos nutrientes. Estos son algunos ejemplos de alimentos que debera evitar ya que contienen muchas caloras y carbohidratos, pero pocos nutrientes:  Physicist, medical trans (la mayora de los alimentos procesados incluyen grasas trans en la etiqueta de Informacin nutricional).  Gaseosas comunes.  Jugos.  Caramelos.  Dulces, como tortas, pasteles, rosquillas y [place].  Comidas fritas. QU ALIMENTOS PUEDO COMER? Consuma alimentos ricos en nutrientes, que nutrirn el cuerpo y lo mantendrn saludable. Los alimentos que debe comer tambin dependern de varios factores, como:  Las caloras que necesita.  Los medicamentos que toma.  Su peso.  El nivel de glucosa en [place].  El Railroad de presin arterial.  El nivel de colesterol. Tambin debe consumir una variedad de [place], como:  Protenas, como carne, aves, pescado, tofu, frutos secos y semillas (las protenas de [place] magros son mejores).  Lambert Mody.  Verduras.  Productos lcteos, como [place], queso y yogur (descremados son mejores).  Panes, granos, pastas, cereales, arroz y frijoles.  Grasas, como aceite de [place], [place] sin grasas trans, aceite de canola, aguacate y [place]. TODOS LOS QUE  PADECEN DIABETES MELLITUS TIENEN EL [place] PLAN DE [place]? Dado que todas las personas que padecen diabetes mellitus son distintas, no hay un solo plan de comidas que funcione para todos. Es muy importante que se rena con un nutricionista que lo ayudar a crear un plan de comidas adecuado para usted. Document Released: 11/12/2007 Document Revised: 08/10/2013 [facility] Patient Information 2015 [place]. This information is not intended to replace advice given to you by your health care provider. Make sure you discuss any questions you have with your health care provider.

## 2014-04-09 LAB — CBC WITH DIFFERENTIAL/PLATELET
BASOS ABS: 0 10*3/uL (ref 0.0–0.1)
Basophils Relative: 0 % (ref 0–1)
Eosinophils Absolute: 0.3 10*3/uL (ref 0.0–0.7)
Eosinophils Relative: 4 % (ref 0–5)
HEMATOCRIT: 32.1 % — AB (ref 39.0–52.0)
HEMOGLOBIN: 10.9 g/dL — AB (ref 13.0–17.0)
LYMPHS PCT: 18 % (ref 12–46)
Lymphs Abs: 1.3 10*3/uL (ref 0.7–4.0)
MCH: 29.1 pg (ref 26.0–34.0)
MCHC: 34 g/dL (ref 30.0–36.0)
MCV: 85.6 fL (ref 78.0–100.0)
MONO ABS: 0.6 10*3/uL (ref 0.1–1.0)
MONOS PCT: 8 % (ref 3–12)
Neutro Abs: 5 10*3/uL (ref 1.7–7.7)
Neutrophils Relative %: 70 % (ref 43–77)
Platelets: 248 10*3/uL (ref 150–400)
RBC: 3.75 MIL/uL — ABNORMAL LOW (ref 4.22–5.81)
RDW: 16.3 % — ABNORMAL HIGH (ref 11.5–15.5)
WBC: 7.1 10*3/uL (ref 4.0–10.5)

## 2014-04-09 LAB — VITAMIN D 25 HYDROXY (VIT D DEFICIENCY, FRACTURES): Vit D, 25-Hydroxy: 25 ng/mL — ABNORMAL LOW (ref 30–89)

## 2014-04-09 LAB — TSH: TSH: 1.534 u[IU]/mL (ref 0.350–4.500)

## 2014-04-11 DIAGNOSIS — M25511 Pain in right shoulder: Secondary | ICD-10-CM | POA: Insufficient documentation

## 2014-06-10 ENCOUNTER — Other Ambulatory Visit: Payer: Self-pay | Admitting: *Deleted

## 2014-06-10 ENCOUNTER — Encounter: Payer: Self-pay | Admitting: Vascular Surgery

## 2014-06-10 DIAGNOSIS — N184 Chronic kidney disease, stage 4 (severe): Secondary | ICD-10-CM

## 2014-06-10 DIAGNOSIS — Z01810 Encounter for preprocedural cardiovascular examination: Secondary | ICD-10-CM

## 2014-07-04 ENCOUNTER — Encounter: Payer: Self-pay | Admitting: Vascular Surgery

## 2014-07-05 ENCOUNTER — Ambulatory Visit (INDEPENDENT_AMBULATORY_CARE_PROVIDER_SITE_OTHER): Payer: Self-pay | Admitting: Vascular Surgery

## 2014-07-05 ENCOUNTER — Ambulatory Visit (HOSPITAL_COMMUNITY)
Admission: RE | Admit: 2014-07-05 | Discharge: 2014-07-05 | Disposition: A | Discharge status: Home / Self Care | Payer: Self-pay | Source: Ambulatory Visit | Attending: Vascular Surgery | Admitting: Vascular Surgery

## 2014-07-05 ENCOUNTER — Ambulatory Visit (INDEPENDENT_AMBULATORY_CARE_PROVIDER_SITE_OTHER)
Admission: RE | Admit: 2014-07-05 | Discharge: 2014-07-05 | Disposition: A | Discharge status: Home / Self Care | Payer: Self-pay | Source: Ambulatory Visit | Attending: Vascular Surgery | Admitting: Vascular Surgery

## 2014-07-05 ENCOUNTER — Encounter: Payer: Self-pay | Admitting: Vascular Surgery

## 2014-07-05 VITALS — BP 152/64 | HR 70 | Ht 64.0 in | Wt 137.1 lb

## 2014-07-05 DIAGNOSIS — N184 Chronic kidney disease, stage 4 (severe): Secondary | ICD-10-CM

## 2014-07-05 DIAGNOSIS — Z01810 Encounter for preprocedural cardiovascular examination: Secondary | ICD-10-CM | POA: Insufficient documentation

## 2014-07-05 NOTE — Progress Notes (Signed)
Patient name: Brian Fuller MRN: [id number] DOB: 21-Dec-1950 Sex: male   Referred by: Posey Pronto  Reason for referral:  Chief Complaint  Patient presents with  . New Evaluation    eval for HD access     HISTORY OF PRESENT ILLNESS: Patient is today for discussion of hemodialysis access. He speaks very little [place] and is here today with an interpreter. He reports that he has had no education regarding a hemodialysis access or treatments. He has never had any hemodialysis needs. Creatinine is the 4-1/2 range  Past Medical History  Diagnosis Date  . Diabetes mellitus without complication   . Glaucoma   . Anemia   . Chronic kidney disease     Past Surgical History  Procedure Laterality Date  . Eye surgery      History   Social History  . Marital Status: Married    Spouse Name: N/A    Number of Children: N/A  . Years of Education: N/A   Occupational History  . Not on file.   Social History Main Topics  . Smoking status: Never Smoker   . Smokeless tobacco: Not on file  . Alcohol Use: No  . Drug Use: Not on file  . Sexual Activity: Not on file   Other Topics Concern  . Not on file   Social History Narrative    Family History  Problem Relation Age of Onset  . Diabetes Mother   . Diabetes Brother   . Diabetes Father   . Hypertension Sister     Allergies as of 07/05/2014  . (No Known Allergies)    Current Outpatient Prescriptions on File Prior to Visit  Medication Sig Dispense Refill  . amLODipine (NORVASC) 10 MG tablet Take 1 tablet (10 mg total) by mouth daily. 30 tablet 3  . calcitRIOL (ROCALTROL) 0.25 MCG capsule Take 1 capsule (0.25 mcg total) by mouth daily. 30 capsule 3  . carvedilol (COREG) 6.25 MG tablet Take 1 tablet (6.25 mg total) by mouth 2 (two) times daily with a meal. 60 tablet 3  . furosemide (LASIX) 20 MG tablet Take 20 mg by mouth daily.    . metFORMIN (GLUCOPHAGE) 500 MG tablet Take 500 mg by mouth daily.    . traMADol  (ULTRAM) 50 MG tablet Take 1 tablet (50 mg total) by mouth every 8 (eight) hours as needed for moderate pain. 30 tablet 0   No current facility-administered medications on file prior to visit.     REVIEW OF SYSTEMS:  Positives indicated with an "X"  CARDIOVASCULAR:  [ ]  chest pain   [ ]  chest pressure   [ ]  palpitations   [ ]  orthopnea   [ ]  dyspnea on exertion   [ ]  claudication   [ ]  rest pain   [ ]  DVT   [ ]  phlebitis PULMONARY:   [ ]  productive cough   [ ]  asthma   [ ]  wheezing NEUROLOGIC:   [ ]  weakness  [ ]  paresthesias  [ ]  aphasia  [x ] amaurosis  [ ]  dizziness HEMATOLOGIC:   [ ]  bleeding problems   [ ]  clotting disorders MUSCULOSKELETAL:  [ ]  joint pain   [ ]  joint swelling GASTROINTESTINAL: [ ]   blood in stool  [ ]   hematemesis GENITOURINARY:  [ ]   dysuria  [ ]   hematuria PSYCHIATRIC:  [ ]  history of major depression INTEGUMENTARY:  [ ]  rashes  [ ]  ulcers CONSTITUTIONAL:  [ ]  fever   [ ]   chills  PHYSICAL EXAMINATION:  General: The patient is a well-nourished male, in no acute distress. Vital signs are BP 152/64 mmHg  Pulse 70  Ht 5\' 4"  (1.626 m)  Wt 137 lb 1.6 oz (62.188 kg)  BMI 23.52 kg/m2  SpO2 100% Pulmonary: There is a good air exchange   Musculoskeletal: There are no major deformities.  There is no significant extremity pain. Neurologic: No focal weakness or paresthesias are detected, Skin: There are no ulcer or rashes noted. Psychiatric: The patient has normal affect. Cardiovascular: 2+ radial pulses bilaterally He has very nicely developed surface veins particularly in his left arm. He has easily visible superficial large cephalic vein throughout his forearm   V[facility] Vascular Lab Studies:  Ordered and Independently Reviewed does have calcification of arteries in both upper extremities but good flow. Has a good size caliber of his left forearm cephalic vein  Impression and Plan:  Chronic renal insufficiency. I had a very long discussion with the patient  including indication for hemodialysis. Also discussed catheter for acute hemodialysis and AV fistula for long-term dialysis. He does appear to be an excellent candidate for left arm AV fistula creation. I have recommended the this and explained this would be an outpatient procedure. He is not willing to schedule is today. He wishes to discuss his with his wife. He will call us when he wishes to proceed for left radiocephalic AV fistula. The patient is right-handed    Whitman Meinhardt [facility] Office: [phone]

## 2014-08-09 ENCOUNTER — Other Ambulatory Visit (HOSPITAL_COMMUNITY): Payer: Self-pay | Admitting: *Deleted

## 2014-08-10 ENCOUNTER — Encounter (HOSPITAL_COMMUNITY)
Admission: RE | Admit: 2014-08-10 | Discharge: 2014-08-10 | Disposition: A | Discharge status: Home / Self Care | Payer: Self-pay | Source: Ambulatory Visit | Attending: Nephrology | Admitting: Nephrology

## 2014-08-10 DIAGNOSIS — D638 Anemia in other chronic diseases classified elsewhere: Secondary | ICD-10-CM | POA: Insufficient documentation

## 2014-08-10 DIAGNOSIS — N185 Chronic kidney disease, stage 5: Secondary | ICD-10-CM | POA: Insufficient documentation

## 2014-08-10 LAB — POCT HEMOGLOBIN-HEMACUE: HEMOGLOBIN: 8.2 g/dL — AB (ref 13.0–17.0)

## 2014-08-10 MED ORDER — EPOETIN ALFA 10000 UNIT/ML IJ SOLN
10000.0000 [IU] | INTRAMUSCULAR | Status: DC
  Administered 2014-08-10: 10000 [IU] via SUBCUTANEOUS

## 2014-08-10 MED ORDER — EPOETIN ALFA 10000 UNIT/ML IJ SOLN
INTRAMUSCULAR | Status: AC
  Administered 2014-08-10: 10000 [IU] via SUBCUTANEOUS
  Filled 2014-08-10: qty 1

## 2014-08-10 NOTE — Discharge Instructions (Signed)
Epoetin Alfa injection Qu es este medicamento? La EPOETINA ALFA ayuda estimular la produccin de glbulos rojos en el cuerpo. Este medicamento se [place] para tratar la anemia asociada con la insuficiencia renal crnica y la quimioterapia o la terapia para el VIH. Puede adems utilizarlo antes de [place] en pacientes con anemia. Este medicamento puede ser utilizado para otros usos; si tiene alguna pregunta consulte con su proveedor de atencin mdica o con su farmacutico. MARCAS COMERCIALES DISPONIBLES: Epogen, Procrit Qu le debo informar a mi profesional de la salud antes de tomar este medicamento? Necesita saber si usted presenta alguno de los siguientes problemas o situaciones: -trastornos de Proofreader de la sangre -paciente con cncer que no recibe quimioterapia -fibrosis qustica -enfermedades cardiacas, tales como angina de pecho, insuficiencia cardiaca -nivel de hemoglobina de 12 g/dL o ms -alta presin sangunea -niveles bajos de folato, hierro o vitamina B12 -convulsiones -una reaccin alrgica o inusual a la eritropoyetina, a la albmina, al alcohol benclico, a las protenas del Production designer, theatre/television/film, a otros medicamentos, alimentos, colorantes o conservantes -si est embarazada o buscando quedar embarazada -si est amamantando a un beb Cmo debo utilizar este medicamento? Este medicamento se administra mediante una inyeccin por va intravenosa o subcutnea. Generalmente lo administra un profesional de Technical sales engineer en un hospital o en un entorno clnico. Si recibe [employer] en su domicilio, le ensearn cmo preparar y Biomedical engineer. selo exactamente como se le indique. Use su dosis a intervalos regulares. No use su medicamento con una frecuencia mayor a la indicada. Es importante que deseche las agujas y las jeringas usadas en un recipiente resistente a los pinchazos. No las deseche en una basura. Si no tiene un recipiente resistente a los pinchazos, consulte a Warden/ranger o su proveedor de atencin para obtenerlo. Hable con su pediatra para informarse acerca del uso de este medicamento en nios. Aunque este medicamento se puede recetar para condiciones selectivas, las precauciones se aplican. Sobredosis: Pngase en contacto inmediatamente con un centro toxicolgico o una sala de urgencia si usted cree que haya tomado demasiado medicamento. ATENCIN: [employer] es solo para usted. No comparta este medicamento con nadie. Qu sucede si me olvido de una dosis? Si olvida una dosis, sela lo antes posible. Si es casi la hora de la prxima dosis, use slo esa dosis. No use dosis adicionales o dobles. Qu puede interactuar con este medicamento? No tome esta medicina con ninguno de los siguientes medicamentos: -darbepoetina alfa Puede ser que esta lista no menciona todas las posibles interacciones. Informe a su profesional de [employer]	[place] de [employer] productos a base de hierbas, medicamentos de [place] o suplementos nutritivos que est tomando. Si usted fuma, consume bebidas alcohlicas o si utiliza drogas ilegales, indqueselo tambin a su profesional de [employer]	[place]. Algunas sustancias pueden interactuar con su medicamento. A qu debo estar atento al usar [employer]? Debe visitar al profesional que extiende sus recetas o a su profesional de la salud para chequear su evolucin peridicamente y para realizarse anlisis de sangre y Chief Technology Officer la presin sangunea. Es especialmente importante para que su mdico pueda asegurar que sus niveles de hemoglobina estn a niveles adecuados para limitar el riesgo de los efectos secundarios potenciales y para darle el mejor beneficio. Asista a todas las citas para las pruebas recomendadas. Controle su presin sangunea como se le haya indicado. Pregunte a su mdico cul debe ser su presin sangunea y cundo deber comunicarse con l o ella. A medida que su cuerpo produzca ms  glbulos rojos, puede ser necesario que  tome suplementos de hierro, cido flico o vitamina B. Consulte a su mdico o a su profesional de la salud sobre cules son los productos adecuados para usted. Si padece una enfermedad renal, contine con las restricciones en la dieta de rutina, aun cuando este medicamento lo haga Building control surveyor. Consulte a su mdico o a Barrister's clerk de la [employer] alimentos y vitaminas que est tomando. Qu efectos secundarios puedo tener al [employer] este medicamento? Efectos secundarios que debe informar a su mdico o a Barrister's clerk de la salud tan pronto como sea posible: -Chief of Staff como erupcin cutnea, picazn o urticarias, hinchazn de la cara, labios o lengua -problemas respiratorios -cambios en la visin -dolor en el pecho -confusin, dificultad para hablar o entender -sensacin de desmayos o mareos, cadas -alta presin sangunea -molestias o dolores musculares -dolor, hinchazn, sensacin de calor en la pierna -rpido aumento de peso -dolor de Patent examiner -entumecimiento o debilidad repentina de la cara, brazo o pierna -problemas para caminar, mareos, prdida de la coordinacin o equilibrio -convulsiones -hinchazn de pies o tobillos -cansancio o debilidad inusual Efectos secundarios que, por lo general, no requieren atencin mdica (debe informarlos a su mdico o a su profesional de la salud si persisten o si son molestos): -diarrea -fiebre, escalofros (sntomas tipo gripales) -dolor de cabeza -nuseas, vmito -enrojecimiento, escozor o Estate agent de la inyeccin Puede ser que esta lista no menciona todos los posibles efectos secundarios. Comunquese a su mdico por asesoramiento mdico [employer]. Usted puede informar los efectos secundarios a la FDA por telfono al 1-800-FDA-1088. Dnde debo guardar mi medicina? Mantngala fuera del alcance de los nios. Gurdela en un refrigerador, a Energy manager entre 2 y 62 grados C (37 y 55 grados  F). No la congele ni la agite. Deseche todo medicamento sin utilizar si utiliza una ampolla de dosis nica. Las [employer] de dosis mltiples pueden Building control surveyor durante 21 das despus de la dosis inicial. Deseche todo el medicamento que no haya utilizado. ATENCIN: Este folleto es un resumen. Puede ser que no cubra toda la posible informacin. Si usted tiene preguntas acerca de esta medicina, consulte con su mdico, su farmacutico o su profesional de Technical sales engineer.  2015, Elsevier/Gold Standard. (2008-08-26 16:07:32)

## 2014-08-17 ENCOUNTER — Encounter (HOSPITAL_COMMUNITY)
Admission: RE | Admit: 2014-08-17 | Discharge: 2014-08-17 | Disposition: A | Discharge status: Home / Self Care | Payer: Self-pay | Source: Ambulatory Visit | Attending: Nephrology | Admitting: Nephrology

## 2014-08-17 LAB — POCT HEMOGLOBIN-HEMACUE: Hemoglobin: 8.3 g/dL — ABNORMAL LOW (ref 13.0–17.0)

## 2014-08-17 MED ORDER — EPOETIN ALFA 10000 UNIT/ML IJ SOLN
INTRAMUSCULAR | Status: AC
  Filled 2014-08-17: qty 1

## 2014-08-17 MED ORDER — EPOETIN ALFA 10000 UNIT/ML IJ SOLN
10000.0000 [IU] | INTRAMUSCULAR | Status: DC
  Administered 2014-08-17: 10000 [IU] via SUBCUTANEOUS

## 2014-08-25 ENCOUNTER — Encounter (HOSPITAL_COMMUNITY)
Admission: RE | Admit: 2014-08-25 | Discharge: 2014-08-25 | Disposition: A | Discharge status: Home / Self Care | Payer: Self-pay | Source: Ambulatory Visit | Attending: Nephrology | Admitting: Nephrology

## 2014-08-25 DIAGNOSIS — D638 Anemia in other chronic diseases classified elsewhere: Secondary | ICD-10-CM | POA: Insufficient documentation

## 2014-08-25 DIAGNOSIS — N185 Chronic kidney disease, stage 5: Secondary | ICD-10-CM | POA: Insufficient documentation

## 2014-08-25 LAB — IRON AND TIBC
Iron: 38 ug/dL — ABNORMAL LOW (ref 42–165)
Saturation Ratios: 16 % — ABNORMAL LOW (ref 20–55)
TIBC: 232 ug/dL (ref 215–435)
UIBC: 194 ug/dL (ref 125–400)

## 2014-08-25 LAB — RENAL FUNCTION PANEL
ALBUMIN: 3.2 g/dL — AB (ref 3.5–5.2)
ANION GAP: 8 (ref 5–15)
BUN: 74 mg/dL — ABNORMAL HIGH (ref 6–23)
CHLORIDE: 111 meq/L (ref 96–112)
CO2: 18 mmol/L — ABNORMAL LOW (ref 19–32)
Calcium: 8.4 mg/dL (ref 8.4–10.5)
Creatinine, Ser: 5.11 mg/dL — ABNORMAL HIGH (ref 0.50–1.35)
GFR, EST AFRICAN AMERICAN: 13 mL/min — AB (ref >90)
GFR, EST NON AFRICAN AMERICAN: 11 mL/min — AB (ref >90)
Glucose, Bld: 179 mg/dL — ABNORMAL HIGH (ref 70–99)
PHOSPHORUS: 5.3 mg/dL — AB (ref 2.3–4.6)
POTASSIUM: 5.1 mmol/L (ref 3.5–5.1)
SODIUM: 137 mmol/L (ref 135–145)

## 2014-08-25 LAB — MAGNESIUM: Magnesium: 2.5 mg/dL (ref 1.5–2.5)

## 2014-08-25 LAB — FERRITIN: FERRITIN: 102 ng/mL (ref 22–322)

## 2014-08-25 LAB — POCT HEMOGLOBIN-HEMACUE: HEMOGLOBIN: 9.3 g/dL — AB (ref 13.0–17.0)

## 2014-08-25 MED ORDER — EPOETIN ALFA 10000 UNIT/ML IJ SOLN
10000.0000 [IU] | INTRAMUSCULAR | Status: DC
  Administered 2014-08-25: 10000 [IU] via SUBCUTANEOUS

## 2014-08-25 MED ORDER — EPOETIN ALFA 10000 UNIT/ML IJ SOLN
INTRAMUSCULAR | Status: AC
  Filled 2014-08-25: qty 1

## 2014-09-01 ENCOUNTER — Encounter (HOSPITAL_COMMUNITY): Payer: Medicaid Other

## 2014-10-14 ENCOUNTER — Encounter (INDEPENDENT_AMBULATORY_CARE_PROVIDER_SITE_OTHER): Payer: Self-pay | Admitting: Ophthalmology

## 2014-10-30 ENCOUNTER — Emergency Department (HOSPITAL_COMMUNITY): Payer: Medicaid Other

## 2014-10-30 ENCOUNTER — Inpatient Hospital Stay (HOSPITAL_COMMUNITY)
Admission: EM | Admit: 2014-10-30 | Discharge: 2014-11-10 | DRG: 853 | Disposition: A | Discharge status: Home / Self Care | Payer: Medicaid Other | Attending: Internal Medicine | Admitting: Internal Medicine

## 2014-10-30 ENCOUNTER — Encounter (HOSPITAL_COMMUNITY): Payer: Self-pay

## 2014-10-30 DIAGNOSIS — Z09 Encounter for follow-up examination after completed treatment for conditions other than malignant neoplasm: Secondary | ICD-10-CM

## 2014-10-30 DIAGNOSIS — I10 Essential (primary) hypertension: Secondary | ICD-10-CM | POA: Diagnosis not present

## 2014-10-30 DIAGNOSIS — D631 Anemia in chronic kidney disease: Secondary | ICD-10-CM | POA: Diagnosis present

## 2014-10-30 DIAGNOSIS — N189 Chronic kidney disease, unspecified: Secondary | ICD-10-CM

## 2014-10-30 DIAGNOSIS — E088 Diabetes mellitus due to underlying condition with unspecified complications: Secondary | ICD-10-CM | POA: Diagnosis not present

## 2014-10-30 DIAGNOSIS — R0602 Shortness of breath: Secondary | ICD-10-CM

## 2014-10-30 DIAGNOSIS — N186 End stage renal disease: Secondary | ICD-10-CM | POA: Diagnosis present

## 2014-10-30 DIAGNOSIS — E8809 Other disorders of plasma-protein metabolism, not elsewhere classified: Secondary | ICD-10-CM | POA: Diagnosis present

## 2014-10-30 DIAGNOSIS — N2581 Secondary hyperparathyroidism of renal origin: Secondary | ICD-10-CM | POA: Diagnosis present

## 2014-10-30 DIAGNOSIS — I120 Hypertensive chronic kidney disease with stage 5 chronic kidney disease or end stage renal disease: Secondary | ICD-10-CM | POA: Diagnosis present

## 2014-10-30 DIAGNOSIS — J9601 Acute respiratory failure with hypoxia: Secondary | ICD-10-CM | POA: Diagnosis present

## 2014-10-30 DIAGNOSIS — K5900 Constipation, unspecified: Secondary | ICD-10-CM | POA: Diagnosis present

## 2014-10-30 DIAGNOSIS — E8770 Fluid overload, unspecified: Secondary | ICD-10-CM | POA: Diagnosis present

## 2014-10-30 DIAGNOSIS — J189 Pneumonia, unspecified organism: Secondary | ICD-10-CM | POA: Diagnosis not present

## 2014-10-30 DIAGNOSIS — E1165 Type 2 diabetes mellitus with hyperglycemia: Secondary | ICD-10-CM

## 2014-10-30 DIAGNOSIS — A419 Sepsis, unspecified organism: Secondary | ICD-10-CM | POA: Diagnosis present

## 2014-10-30 DIAGNOSIS — R0600 Dyspnea, unspecified: Secondary | ICD-10-CM

## 2014-10-30 DIAGNOSIS — J181 Lobar pneumonia, unspecified organism: Secondary | ICD-10-CM | POA: Diagnosis present

## 2014-10-30 DIAGNOSIS — D649 Anemia, unspecified: Secondary | ICD-10-CM | POA: Diagnosis not present

## 2014-10-30 DIAGNOSIS — E1159 Type 2 diabetes mellitus with other circulatory complications: Secondary | ICD-10-CM | POA: Diagnosis present

## 2014-10-30 DIAGNOSIS — E118 Type 2 diabetes mellitus with unspecified complications: Secondary | ICD-10-CM

## 2014-10-30 DIAGNOSIS — E10351 Type 1 diabetes mellitus with proliferative diabetic retinopathy with macular edema: Secondary | ICD-10-CM | POA: Diagnosis present

## 2014-10-30 DIAGNOSIS — Z79899 Other long term (current) drug therapy: Secondary | ICD-10-CM

## 2014-10-30 DIAGNOSIS — N185 Chronic kidney disease, stage 5: Secondary | ICD-10-CM | POA: Diagnosis not present

## 2014-10-30 DIAGNOSIS — I152 Hypertension secondary to endocrine disorders: Secondary | ICD-10-CM | POA: Diagnosis present

## 2014-10-30 DIAGNOSIS — E1139 Type 2 diabetes mellitus with other diabetic ophthalmic complication: Secondary | ICD-10-CM

## 2014-10-30 DIAGNOSIS — Z794 Long term (current) use of insulin: Secondary | ICD-10-CM

## 2014-10-30 DIAGNOSIS — Z95828 Presence of other vascular implants and grafts: Secondary | ICD-10-CM

## 2014-10-30 DIAGNOSIS — J1100 Influenza due to unidentified influenza virus with unspecified type of pneumonia: Secondary | ICD-10-CM | POA: Diagnosis present

## 2014-10-30 DIAGNOSIS — Z419 Encounter for procedure for purposes other than remedying health state, unspecified: Secondary | ICD-10-CM

## 2014-10-30 DIAGNOSIS — N179 Acute kidney failure, unspecified: Secondary | ICD-10-CM | POA: Diagnosis present

## 2014-10-30 DIAGNOSIS — E872 Acidosis: Secondary | ICD-10-CM | POA: Diagnosis present

## 2014-10-30 DIAGNOSIS — J1108 Influenza due to unidentified influenza virus with specified pneumonia: Secondary | ICD-10-CM | POA: Diagnosis present

## 2014-10-30 DIAGNOSIS — Z992 Dependence on renal dialysis: Secondary | ICD-10-CM

## 2014-10-30 DIAGNOSIS — H211X9 Other vascular disorders of iris and ciliary body, unspecified eye: Secondary | ICD-10-CM

## 2014-10-30 DIAGNOSIS — E103519 Type 1 diabetes mellitus with proliferative diabetic retinopathy with macular edema, unspecified eye: Secondary | ICD-10-CM | POA: Diagnosis present

## 2014-10-30 LAB — CBC WITH DIFFERENTIAL/PLATELET
BASOS ABS: 0 10*3/uL (ref 0.0–0.1)
Basophils Relative: 0 % (ref 0–1)
Eosinophils Absolute: 0 10*3/uL (ref 0.0–0.7)
Eosinophils Relative: 0 % (ref 0–5)
HEMATOCRIT: 23 % — AB (ref 39.0–52.0)
HEMOGLOBIN: 7.5 g/dL — AB (ref 13.0–17.0)
Lymphocytes Relative: 2 % — ABNORMAL LOW (ref 12–46)
Lymphs Abs: 0.2 10*3/uL — ABNORMAL LOW (ref 0.7–4.0)
MCH: 30.7 pg (ref 26.0–34.0)
MCHC: 32.6 g/dL (ref 30.0–36.0)
MCV: 94.3 fL (ref 78.0–100.0)
MONOS PCT: 4 % (ref 3–12)
Monocytes Absolute: 0.4 10*3/uL (ref 0.1–1.0)
NEUTROS ABS: 9.1 10*3/uL — AB (ref 1.7–7.7)
NEUTROS PCT: 94 % — AB (ref 43–77)
Platelets: 171 10*3/uL (ref 150–400)
RBC: 2.44 MIL/uL — ABNORMAL LOW (ref 4.22–5.81)
RDW: 14 % (ref 11.5–15.5)
WBC: 9.7 10*3/uL (ref 4.0–10.5)

## 2014-10-30 LAB — COMPREHENSIVE METABOLIC PANEL
ALT: 36 U/L (ref 0–53)
AST: 44 U/L — ABNORMAL HIGH (ref 0–37)
Albumin: 2.5 g/dL — ABNORMAL LOW (ref 3.5–5.2)
Alkaline Phosphatase: 69 U/L (ref 39–117)
Anion gap: 11 (ref 5–15)
BUN: 71 mg/dL — ABNORMAL HIGH (ref 6–23)
CALCIUM: 7.9 mg/dL — AB (ref 8.4–10.5)
CO2: 15 mmol/L — ABNORMAL LOW (ref 19–32)
Chloride: 110 mmol/L (ref 96–112)
Creatinine, Ser: 5.93 mg/dL — ABNORMAL HIGH (ref 0.50–1.35)
GFR calc non Af Amer: 9 mL/min — ABNORMAL LOW (ref >90)
GFR, EST AFRICAN AMERICAN: 11 mL/min — AB (ref >90)
Glucose, Bld: 150 mg/dL — ABNORMAL HIGH (ref 70–99)
Potassium: 4.9 mmol/L (ref 3.5–5.1)
SODIUM: 136 mmol/L (ref 135–145)
TOTAL PROTEIN: 6 g/dL (ref 6.0–8.3)
Total Bilirubin: 0.5 mg/dL (ref 0.3–1.2)

## 2014-10-30 LAB — I-STAT ARTERIAL BLOOD GAS, ED
Acid-base deficit: 12 mmol/L — ABNORMAL HIGH (ref 0.0–2.0)
Bicarbonate: 12.7 mEq/L — ABNORMAL LOW (ref 20.0–24.0)
O2 Saturation: 96 %
PO2 ART: 102 mmHg — AB (ref 80.0–100.0)
Patient temperature: 102
TCO2: 13 mmol/L (ref 0–100)
pCO2 arterial: 27.3 mmHg — ABNORMAL LOW (ref 35.0–45.0)
pH, Arterial: 7.284 — ABNORMAL LOW (ref 7.350–7.450)

## 2014-10-30 LAB — INFLUENZA PANEL BY PCR (TYPE A & B)
H1N1FLUPCR: DETECTED — AB
Influenza A By PCR: POSITIVE — AB
Influenza B By PCR: NEGATIVE

## 2014-10-30 LAB — URINE MICROSCOPIC-ADD ON: RBC / HPF: NONE SEEN RBC/hpf (ref <3)

## 2014-10-30 LAB — MRSA PCR SCREENING: MRSA by PCR: NEGATIVE

## 2014-10-30 LAB — URINALYSIS, ROUTINE W REFLEX MICROSCOPIC
BILIRUBIN URINE: NEGATIVE
Glucose, UA: NEGATIVE mg/dL
KETONES UR: NEGATIVE mg/dL
Leukocytes, UA: NEGATIVE
NITRITE: NEGATIVE
PH: 5 (ref 5.0–8.0)
Protein, ur: 100 mg/dL — AB
SPECIFIC GRAVITY, URINE: 1.015 (ref 1.005–1.030)
UROBILINOGEN UA: 0.2 mg/dL (ref 0.0–1.0)

## 2014-10-30 LAB — CREATININE, SERUM
CREATININE: 5.98 mg/dL — AB (ref 0.50–1.35)
GFR calc Af Amer: 10 mL/min — ABNORMAL LOW (ref >90)
GFR, EST NON AFRICAN AMERICAN: 9 mL/min — AB (ref >90)

## 2014-10-30 LAB — CG4 I-STAT (LACTIC ACID): LACTIC ACID, VENOUS: 0.51 mmol/L (ref 0.5–2.0)

## 2014-10-30 LAB — PROTIME-INR
INR: 1.13 (ref 0.00–1.49)
Prothrombin Time: 14.7 seconds (ref 11.6–15.2)

## 2014-10-30 LAB — APTT: aPTT: 47 seconds — ABNORMAL HIGH (ref 24–37)

## 2014-10-30 LAB — ABO/RH: ABO/RH(D): O POS

## 2014-10-30 LAB — PROCALCITONIN: PROCALCITONIN: 6.24 ng/mL

## 2014-10-30 LAB — I-STAT CG4 LACTIC ACID, ED: Lactic Acid, Venous: 1.22 mmol/L (ref 0.5–2.0)

## 2014-10-30 MED ORDER — DEXTROSE 5 % IV SOLN
1.0000 g | Freq: Once | INTRAVENOUS | Status: AC
  Administered 2014-10-30: 1 g via INTRAVENOUS
  Filled 2014-10-30: qty 10

## 2014-10-30 MED ORDER — DEXTROSE 5 % IV SOLN
500.0000 mg | Freq: Once | INTRAVENOUS | Status: AC
  Administered 2014-10-30: 500 mg via INTRAVENOUS
  Filled 2014-10-30: qty 500

## 2014-10-30 MED ORDER — ONDANSETRON HCL 4 MG/2ML IJ SOLN: 4.0000 mg | Freq: Four times a day (QID) | INTRAMUSCULAR | Status: DC | PRN

## 2014-10-30 MED ORDER — DOCUSATE SODIUM 100 MG PO CAPS
100.0000 mg | ORAL_CAPSULE | Freq: Two times a day (BID) | ORAL | Status: DC
  Administered 2014-10-30 – 2014-11-06 (×14): 100 mg via ORAL
  Filled 2014-10-30 (×17): qty 1

## 2014-10-30 MED ORDER — HEPARIN SODIUM (PORCINE) 5000 UNIT/ML IJ SOLN
5000.0000 [IU] | Freq: Three times a day (TID) | INTRAMUSCULAR | Status: DC
  Administered 2014-10-30 – 2014-11-10 (×25): 5000 [IU] via SUBCUTANEOUS
  Filled 2014-10-30 (×36): qty 1

## 2014-10-30 MED ORDER — DEXTROSE 5 % IV SOLN: 500.0000 mg | INTRAVENOUS | Status: DC

## 2014-10-30 MED ORDER — SODIUM CHLORIDE 0.9 % IJ SOLN
3.0000 mL | Freq: Two times a day (BID) | INTRAMUSCULAR | Status: DC
  Administered 2014-10-30 – 2014-11-10 (×22): 3 mL via INTRAVENOUS

## 2014-10-30 MED ORDER — ACETAMINOPHEN 650 MG RE SUPP
650.0000 mg | Freq: Four times a day (QID) | RECTAL | Status: DC | PRN
Start: 2014-10-30 — End: 2014-11-10

## 2014-10-30 MED ORDER — DEXTROSE 5 % IV SOLN: 1.0000 g | INTRAVENOUS | Status: DC

## 2014-10-30 MED ORDER — PIPERACILLIN-TAZOBACTAM 3.375 G IVPB
3.3750 g | Freq: Once | INTRAVENOUS | Status: DC
Start: 2014-10-30 — End: 2014-10-30
  Filled 2014-10-30: qty 50

## 2014-10-30 MED ORDER — CARVEDILOL 6.25 MG PO TABS
6.2500 mg | ORAL_TABLET | Freq: Two times a day (BID) | ORAL | Status: DC
  Administered 2014-10-30 – 2014-11-04 (×9): 6.25 mg via ORAL
  Filled 2014-10-30 (×12): qty 1

## 2014-10-30 MED ORDER — ACETAMINOPHEN 325 MG PO TABS
650.0000 mg | ORAL_TABLET | Freq: Four times a day (QID) | ORAL | Status: DC | PRN
  Administered 2014-10-31 – 2014-11-01 (×2): 650 mg via ORAL
  Filled 2014-10-30 (×3): qty 2

## 2014-10-30 MED ORDER — INSULIN ASPART 100 UNIT/ML ~~LOC~~ SOLN
0.0000 [IU] | SUBCUTANEOUS | Status: DC
  Administered 2014-10-30: 1 [IU] via SUBCUTANEOUS
  Administered 2014-10-30: 2 [IU] via SUBCUTANEOUS

## 2014-10-30 MED ORDER — FUROSEMIDE 10 MG/ML IJ SOLN
120.0000 mg | Freq: Three times a day (TID) | INTRAVENOUS | Status: DC
  Administered 2014-10-30: 120 mg via INTRAVENOUS
  Filled 2014-10-30 (×2): qty 12

## 2014-10-30 MED ORDER — PIPERACILLIN-TAZOBACTAM IN DEX 2-0.25 GM/50ML IV SOLN
2.2500 g | Freq: Three times a day (TID) | INTRAVENOUS | Status: DC
  Administered 2014-10-31 – 2014-11-02 (×8): 2.25 g via INTRAVENOUS
  Filled 2014-10-30 (×13): qty 50

## 2014-10-30 MED ORDER — MORPHINE SULFATE 2 MG/ML IJ SOLN: 2.0000 mg | INTRAMUSCULAR | Status: DC | PRN

## 2014-10-30 MED ORDER — ONDANSETRON HCL 4 MG PO TABS: 4.0000 mg | ORAL_TABLET | Freq: Four times a day (QID) | ORAL | Status: DC | PRN

## 2014-10-30 MED ORDER — ACETAMINOPHEN 500 MG PO TABS
1000.0000 mg | ORAL_TABLET | Freq: Once | ORAL | Status: AC
  Administered 2014-10-30: 1000 mg via ORAL
  Filled 2014-10-30: qty 2

## 2014-10-30 MED ORDER — PIPERACILLIN-TAZOBACTAM 3.375 G IVPB 30 MIN
3.3750 g | Freq: Once | INTRAVENOUS | Status: AC
  Administered 2014-10-30: 3.375 g via INTRAVENOUS
  Filled 2014-10-30: qty 50

## 2014-10-30 MED ORDER — VANCOMYCIN HCL IN DEXTROSE 1-5 GM/200ML-% IV SOLN
1000.0000 mg | INTRAVENOUS | Status: DC
  Administered 2014-10-30 – 2014-11-01 (×2): 1000 mg via INTRAVENOUS
  Filled 2014-10-30 (×2): qty 200

## 2014-10-30 MED ORDER — PIPERACILLIN-TAZOBACTAM 3.375 G IVPB
3.3750 g | Freq: Three times a day (TID) | INTRAVENOUS | Status: DC
  Filled 2014-10-30 (×3): qty 50

## 2014-10-30 MED ORDER — FUROSEMIDE 10 MG/ML IJ SOLN
80.0000 mg | Freq: Three times a day (TID) | INTRAMUSCULAR | Status: DC
  Administered 2014-10-30: 80 mg via INTRAVENOUS
  Filled 2014-10-30: qty 8

## 2014-10-30 MED ORDER — ALBUTEROL SULFATE (2.5 MG/3ML) 0.083% IN NEBU
5.0000 mg | INHALATION_SOLUTION | Freq: Once | RESPIRATORY_TRACT | Status: AC
  Administered 2014-10-30: 5 mg via RESPIRATORY_TRACT
  Filled 2014-10-30: qty 6

## 2014-10-30 MED ORDER — ALBUTEROL SULFATE (2.5 MG/3ML) 0.083% IN NEBU: 2.5000 mg | INHALATION_SOLUTION | Freq: Four times a day (QID) | RESPIRATORY_TRACT | Status: AC | PRN

## 2014-10-30 MED ORDER — FUROSEMIDE 10 MG/ML IJ SOLN: 80.0000 mg | Freq: Once | INTRAMUSCULAR | Status: DC

## 2014-10-30 NOTE — H&P (Signed)
[facility] History and Physical                                                                                    Brian Fuller, is a 64 y.o. male  MRN: [id number]   DOB - 13-Aug-1951  Admit Date - 10/30/2014  Outpatient Primary MD for the patient is PROVIDER NOT [facility]. He is an unassigned patient.  With History of -  Past Medical History  Diagnosis Date  . Diabetes mellitus without complication   . Glaucoma   . Anemia   . Chronic kidney disease       Past Surgical History  Procedure Laterality Date  . Eye surgery      in for   Chief Complaint  Patient presents with  . Shortness of Breath  . Fever     HPI  Brian Fuller  is a 64 y.o. male, with a pmh CKD, stage IV, diabetes mellitus, anemia, and glaucoma.  He presents to the emergency department today with sepsis secondary to multifocal bilateral pneumonia. The patient does not speak [place]. His friend, Karn Cassis is at bedside and translates for [place]. For at least the last 3 weeks the patient has been increasingly short of breath and weak. He has developed a severe cough which produces white sputum. He went to the doctor last Wednesday because he was feeling so poorly he received treatment (Phenergan?) and went home. Last night he was unable to sleep due to his inability to breathe. He cannot stop coughing. He has been urinating very little despite drinking increased amounts of water. His urine is very light in color. He vomited on Thursday 3/10 and has not been eating much at all since then. He has been suffering with sweats and chills, and has had subjective fevers.  Workup in the ER shows a temperature of 102. Respirations 33, O2 sat 87%, pulse rate 100. He was placed on BiPAP. ABG shows a pH of 7.284. He has metabolic acidosis. Creatinine is 5.91 (baseline 5.11 on 08/25/14). Lactic acid is 1.2.  Review of Systems    In addition to the HPI above, +for sick contacts -  his wife has a cough as well. Patient is on Bipap and does not speak [place].  ROS was limited, and as above.    Social History History  Substance Use Topics  . Smoking status: Never Smoker   . Smokeless tobacco: Not on file  . Alcohol Use: No    Family History Family History  Problem Relation Age of Onset  . Diabetes Mother   . Diabetes Brother   . Diabetes Father   . Hypertension Sister     Prior to Admission medications   Medication Sig Start Date End Date Taking? Authorizing Provider  amLODipine (NORVASC) 10 MG tablet Take 1 tablet (10 mg total) by mouth daily. 02/25/14  Yes Jerrye Noble, MD  calcitRIOL (ROCALTROL) 0.25 MCG capsule Take 1 capsule (0.25 mcg total) by mouth daily. 02/25/14  Yes Jerrye Noble, MD  carvedilol (COREG) 6.25 MG tablet Take 1 tablet (6.25 mg total) by mouth 2 (two) times daily with a meal. 02/25/14  Yes Jerrye Noble, MD  dextromethorphan 15 MG/5ML syrup Take 10 mLs by mouth 4 (four) times daily as needed for cough.   Yes Historical Provider, MD  furosemide (LASIX) 20 MG tablet Take 20 mg by mouth daily.   Yes Historical Provider, MD  traMADol (ULTRAM) 50 MG tablet Take 1 tablet (50 mg total) by mouth every 8 (eight) hours as needed for moderate pain. Patient not taking: Reported on 10/30/2014 04/08/14   Lorayne Marek, MD    No Known Allergies  Physical Exam  Vitals  Blood pressure 140/64, pulse 82, temperature 99.4 F (37.4 C), temperature source Oral, resp. rate 21, height 5\' 5"  (1.651 m), weight 65.5 kg (144 lb 6.4 oz), SpO2 100 %.   General: Wd, hispanic male,  lying in bed on Bipap with increased work of breathing.  Friends at bedside.  Psych:  patient is calm and appears to answer questions appropriately.  ENT:  Ears and Eyes appear Normal, Conjunctivae clear, PER. Moist oral mucosa without erythema or exudates.  Neck:  Supple, No lymphadenopathy appreciated  Respiratory:  Symmetrical chest wall movement, Coarse crackles thru out.   Moderately increased work of breathing.  Cardiac:  RRR, difficult to hear due to breathing, but I did not detect and frank m/r/g.  ++JVD, ++3+ pitting edema in LE.  Abdomen:  Positive bowel sounds, Soft, Non tender, Non distended,  No masses appreciated  Skin:  No Cyanosis, Normal Skin Turgor, No Skin Rash or Bruise.  Extremities:  Able to move all 4. 5/5 strength in each,  no effusions.  Data Review  CBC  Recent Labs Lab 10/30/14 0930  WBC 9.7  HGB 7.5*  HCT 23.0*  PLT 171  MCV 94.3  MCH 30.7  MCHC 32.6  RDW 14.0  LYMPHSABS 0.2*  MONOABS 0.4  EOSABS 0.0  BASOSABS 0.0    Chemistries   Recent Labs Lab 10/30/14 0930  NA 136  K 4.9  CL 110  CO2 15*  GLUCOSE 150*  BUN 71*  CREATININE 5.93*  CALCIUM 7.9*  AST 44*  ALT 36  ALKPHOS 69  BILITOT 0.5     Urinalysis    Component Value Date/Time   COLORURINE YELLOW 02/21/2014 [place] 02/21/2014 1851   LABSPEC 1.011 02/21/2014 1851   PHURINE 6.0 02/21/2014 1851   GLUCOSEU NEGATIVE 02/21/2014 1851   HGBUR TRACE* 02/21/2014 1851   BILIRUBINUR NEGATIVE 02/21/2014 1851   KETONESUR NEGATIVE 02/21/2014 1851   PROTEINUR 100* 02/21/2014 1851   UROBILINOGEN 0.2 02/21/2014 1851   NITRITE NEGATIVE 02/21/2014 1851   LEUKOCYTESUR NEGATIVE 02/21/2014 1851    Imaging results:   Dg Chest Port 1 View  10/30/2014   CLINICAL DATA:  Post sepsis. Shortness of breath and cough. White sputum and fever.  EXAM: PORTABLE CHEST - 1 VIEW  COMPARISON:  03/30/2012.  FINDINGS: Mild cardiac enlargement is noted. Bilateral, upper and lower lobe airspace opacities are identified left greater than right. No pleural effusion or edema noted. The visualized osseous structures are unremarkable.  IMPRESSION: 1. Bilateral, multi focal airspace opacities compatible with pneumonia.   Electronically Signed   By: Kerby Moors M.D.   On: 10/30/2014 10:07    My personal review of EKG: NSR   Assessment & Plan  Principal  Problem:   Sepsis Active Problems:   Bilateral Multifocal Pneumonia.   Anemia   Diabetes   Acute on chronic renal failure   Essential hypertension, benign   Sepsis  Secondary to bilateral pneumonia.  Admitted to [facility] under sepsis protocol. Vanc & Zosyn per pharmacy. Blood cultures are pending.  Urine is pending collection.  Acute Hypoxic respiratory Failure Requiring Bipap. Secondary to PNA and Volume overload. Patient is a full code.  Bilateral Multifocal PNA Antibiotics as above. Blood cultures, HIV, and Urine for legionella and strep pneumoniae antigens are pending. Influenza PCR Pending.  Acute on Chronic Renal Failure with frank volume overload and metabolic acidosis. Patient has been considering HD for some time.  He saw Vascular in 11/15 and has been considering having access placed. He sees Dr. Posey Pronto.  Nephrology has been consulted. Will give 80 mg IV lasix now and 80 mg IV TID.  Nephrology to adjust.    Diabetes Mellitus SSI - S q 4 hours while NPO on bipap.  HTN Continue Coreg.  Hold Amlodipine.  Receiving high dose lasix.  Anemia Baseline Hgb 9.3 now 7.5. Likely secondary to acute sepsis and CKD. Patient denies bloody emesis or bowel movements.   Type and screen collected.   DVT Prophylaxis: Heparin.  AM Labs Ordered, also please review Full Orders  Family Communication:   Friends at bedside.  Code Status:  full  Condition:  Guarded.  Time spent in minutes : [place],  PA-C on 10/30/2014 at 12:54 PM  Between 7am to 7pm - Pager - [phone]  After 7pm go to [email or web address] - password [email or web address]  And look for the night coverage person covering me after hours  [facility]

## 2014-10-30 NOTE — Progress Notes (Signed)
Utilization Review Completed.   Tamar Miano, RN, BSN Nurse Case Manager  

## 2014-10-30 NOTE — Consult Note (Signed)
Renal Service Consult Note [facility]  Brian Fuller 10/30/2014 [place] Requesting Physician:  Dr Clementeen Graham  Reason for Consult:  CKD patient w fevers, cough , PNA HPI: The patient is a 64 y.o. year-old with hx of DM and CKD followed by DR Posey Pronto.  His creat was 4.5 last summer.  He presented with 24-48 hr hx of fevers, chills, coughing.  No abd pain or prod cough.  He came to ED where CXR showed prominent bilat infiltrates L > R.    He sees Dr Posey Pronto at [facility].  Family says they have talked to him about dialysis but that he hasn't needed it yet but suspects he will need it in the future.    Home meds > norvasc, Rocaltrol, carvedilol, lasix, Ultram   ROS  no rash  no HA  no confusion  no abd pain  no diarrhea  no jt pain  no back pain    Past Medical History  Past Medical History  Diagnosis Date  . Diabetes mellitus without complication   . Glaucoma   . Anemia   . Chronic kidney disease    Past Surgical History  Past Surgical History  Procedure Laterality Date  . Eye surgery     Family History  Family History  Problem Relation Age of Onset  . Diabetes Mother   . Diabetes Brother   . Diabetes Father   . Hypertension Sister    Social History  reports that he has never smoked. He does not have any smokeless tobacco history on file. He reports that he does not drink alcohol. His drug history is not on file. Allergies No Known Allergies Home medications Prior to Admission medications   Medication Sig Start Date End Date Taking? Authorizing Provider  amLODipine (NORVASC) 10 MG tablet Take 1 tablet (10 mg total) by mouth daily. 02/25/14  Yes Jerrye Noble, MD  calcitRIOL (ROCALTROL) 0.25 MCG capsule Take 1 capsule (0.25 mcg total) by mouth daily. 02/25/14  Yes Jerrye Noble, MD  carvedilol (COREG) 6.25 MG tablet Take 1 tablet (6.25 mg total) by mouth 2 (two) times daily with a meal. 02/25/14  Yes Jerrye Noble, MD  dextromethorphan 15 MG/5ML syrup  Take 10 mLs by mouth 4 (four) times daily as needed for cough.   Yes Historical Provider, MD  furosemide (LASIX) 20 MG tablet Take 20 mg by mouth daily.   Yes Historical Provider, MD  traMADol (ULTRAM) 50 MG tablet Take 1 tablet (50 mg total) by mouth every 8 (eight) hours as needed for moderate pain. Patient not taking: Reported on 10/30/2014 04/08/14   Lorayne Marek, MD   Liver Function Tests  Recent Labs Lab 10/30/14 0930  AST 44*  ALT 36  ALKPHOS 69  BILITOT 0.5  PROT 6.0  ALBUMIN 2.5*   No results for input(s): LIPASE, AMYLASE in the last 168 hours. CBC  Recent Labs Lab 10/30/14 0930  WBC 9.7  NEUTROABS 9.1*  HGB 7.5*  HCT 23.0*  MCV 94.3  PLT [id number]   Basic Metabolic Panel  Recent Labs Lab 10/30/14 0930 10/30/14 1430  NA 136  --   K 4.9  --   CL 110  --   CO2 15*  --   GLUCOSE 150*  --   BUN 71*  --   CREATININE 5.93* 5.98*  CALCIUM 7.9*  --     Filed Vitals:   10/30/14 1200 10/30/14 1215 10/30/14 1219 10/30/14 1230  BP: 132/52 141/58  140/64  Pulse:  85  82  Temp:   99.4 F (37.4 C)   TempSrc:      Resp: 26 21  21   Height:      Weight:      SpO2:  100%  100%   Exam Alert, not in distress No rash, cyanosis or gangrene Sclera anicteric, throat clear +JVD Coarse insp/exp rales bilat lung fields RRR no RMG Abd soft, no ascites, scaphoid, nontende GU normal LE's w 2+ pitting edema pretib bilat Neuro is nf, Ox 3   Assessment: 1. CKD stage 4/5 - not grossly uremic and is slightly vol overloaded w LE edema.   2. Fever / bilat pulm infiltrates - c/w CAP, on IV vanc/zosyn.  Not in distress 3. Volume - some vol excess w LE edema. Have increased lasix to 120 mg every 8 hours.  4. DM 5. HTN on norvasc, coreg at home 6. Anemia Hb 7.5   Plan- agree with lasix 80 every 8 hours for a day or two.  Does not require dialysis, no signs of sig uremia.    Kelly Splinter MD (pgr) [phone]    [phone] 10/30/2014, 4:44 PM

## 2014-10-30 NOTE — ED Notes (Signed)
Pt here for sob, cough with white sputum and fever. Pt family reports he is in ESRD and pt does not want dialysis, pt is full in all lobes. Swelling 3 + to bilateral legs and febrile, initial sats 75 on RA and placed on 4 liters and came to 88 % dr Sabra Heck called to bedside, and pt placed on bipap.

## 2014-10-30 NOTE — Progress Notes (Addendum)
ANTIBIOTIC CONSULT NOTE - INITIAL  Pharmacy Consult for Vancomycin + Zosyn Indication: rule out sepsis  No Known Allergies  Patient Measurements: Height: 5\' 5"  (165.1 cm) Weight: 144 lb 6.4 oz (65.5 kg) IBW/kg (Calculated) : 61.5  Vital Signs: Temp: 99.4 F (37.4 C) (03/13 1219) Temp Source: Oral (03/13 0941) BP: 140/64 mmHg (03/13 1230) Pulse Rate: 82 (03/13 1230) Intake/Output from previous day:   Intake/Output from this shift:    Labs:  Recent Labs  10/30/14 0930  WBC 9.7  HGB 7.5*  PLT 171  CREATININE 5.93*   Estimated Creatinine Clearance: 11.2 mL/min (by C-G formula based on Cr of 5.93). No results for input(s): VANCOTROUGH, VANCOPEAK, VANCORANDOM, GENTTROUGH, GENTPEAK, GENTRANDOM, TOBRATROUGH, TOBRAPEAK, TOBRARND, AMIKACINPEAK, AMIKACINTROU, AMIKACIN in the last 72 hours.   Microbiology: No results found for this or any previous visit (from the past 720 hour(s)).  Medical History: Past Medical History  Diagnosis Date  . Diabetes mellitus without complication   . Glaucoma   . Anemia   . Chronic kidney disease     Assessment: 64yo male w/ CKD stage IV refusing/non-compliant w/ HD.  Presented to ED 10/30/2014 w/ sepsis secondary to multifocal PNA w/ acute hypoxic respiratory failure and volume overload.  CrCl currently 11 mL/min, renal consulted.  Pharmacy consulted to dose Vanc and Zosyn.  3/13 UCx >>  3/13 BCx2 >>  Azithro x1 dose 3/13 CTX x1 dose 3/13 Vanc 3/13 >>  Zosyn 3/13 >>   Goal of Therapy:  Vancomycin trough level 15-20 mcg/ml  Plan:  -Vancomycin 1000mg  IV Q48h -Zosyn 2.25g Q8h -F/U C&S, renal function, clinical improvement, vanc trough at steady state  Drucie Opitz, PharmD Clinical Pharmacy Resident Pager: [phone] 10/30/2014 2:06 PM

## 2014-10-30 NOTE — ED Notes (Signed)
Pt. Is aware of needing an urine specimen 

## 2014-10-30 NOTE — ED Provider Notes (Signed)
CSN: [id number]     Arrival date & time 10/30/14  0901 History   First MD Initiated Contact with Patient 10/30/14 0920     Chief Complaint  Patient presents with  . Shortness of Breath  . Fever     (Consider location/radiation/quality/duration/timing/severity/associated sxs/prior Treatment) HPI Comments: The patient is a 64 year old male, he has a known history of hypertension, diabetes and renal failure stage IV. He has recently in November 2015 met with the vascular surgeons who recommended placing an arteriovenous fistula has preparation for the inevitable progress of his kidney failure and need for dialysis, at that time he declined. He states that he saw his doctor on Wednesday because he was already having a cough, they recommended that he take his blood pressure medication and prescribed him promethazine syrup to help with the cough, the cough is gradually worsened and is now become severe with associated severe shortness of breath, fever which was treated with Tylenol last night and swelling of the lower extremities. He has associated wheezing but no history of lung disease. He does have a history of smoking but has quit.  The history is provided by the patient, the spouse and medical records. The history is limited by a language barrier. A language interpreter was used.    Past Medical History  Diagnosis Date  . Diabetes mellitus without complication   . Glaucoma   . Anemia   . Chronic kidney disease    Past Surgical History  Procedure Laterality Date  . Eye surgery     Family History  Problem Relation Age of Onset  . Diabetes Mother   . Diabetes Brother   . Diabetes Father   . Hypertension Sister    History  Substance Use Topics  . Smoking status: Never Smoker   . Smokeless tobacco: Not on file  . Alcohol Use: No    Review of Systems  All other systems reviewed and are negative.     Allergies  Review of patient's allergies indicates no known  allergies.  Home Medications   Prior to Admission medications   Medication Sig Start Date End Date Taking? Authorizing Provider  amLODipine (NORVASC) 10 MG tablet Take 1 tablet (10 mg total) by mouth daily. 02/25/14  Yes Jerrye Noble, MD  calcitRIOL (ROCALTROL) 0.25 MCG capsule Take 1 capsule (0.25 mcg total) by mouth daily. 02/25/14  Yes Jerrye Noble, MD  carvedilol (COREG) 6.25 MG tablet Take 1 tablet (6.25 mg total) by mouth 2 (two) times daily with a meal. 02/25/14  Yes Jerrye Noble, MD  dextromethorphan 15 MG/5ML syrup Take 10 mLs by mouth 4 (four) times daily as needed for cough.   Yes Historical Provider, MD  furosemide (LASIX) 20 MG tablet Take 20 mg by mouth daily.   Yes Historical Provider, MD  traMADol (ULTRAM) 50 MG tablet Take 1 tablet (50 mg total) by mouth every 8 (eight) hours as needed for moderate pain. Patient not taking: Reported on 10/30/2014 04/08/14   Lorayne Marek, MD   BP 117/62 mmHg  Pulse 80  Temp(Src) 102 F (38.9 C) (Oral)  Resp 26  Ht $R'5\' 5"'uM$  (1.651 m)  Wt 144 lb 6.4 oz (65.5 kg)  BMI 24.03 kg/m2  SpO2 98% Physical Exam  Constitutional:  Respiratory distress  HENT:  Oropharynx clear and moist  Eyes:  Pupils are equal round and reactive to light, conjunctiva are clear, no icterus, no discharge  Neck:  Supple neck with no lymphadenopathy, mild JVD  Pulmonary/Chest:  Diffuse expiratory wheezing, inspiratory and expiratory rhonchi and rales, accessory muscle use, increased work of breathing, visibly dyspneic  Abdominal:  Soft nontender abdomen, no guarding or masses  Musculoskeletal: Normal range of motion. He exhibits no tenderness.  1+ bilateral symmetrical peripheral edema below the knees  Neurological:  Follows commands, normal strength in all 4 extremities, cranial nerves III through XII grossly intact  Skin:  No rashes to the skin, warm and dry    ED Course  Procedures (including critical care time) Labs Review Labs Reviewed  CBC WITH  DIFFERENTIAL/PLATELET - Abnormal; Notable for the following:    RBC 2.44 (*)    Hemoglobin 7.5 (*)    HCT 23.0 (*)    Neutrophils Relative % 94 (*)    Neutro Abs 9.1 (*)    Lymphocytes Relative 2 (*)    Lymphs Abs 0.2 (*)    All other components within normal limits  COMPREHENSIVE METABOLIC PANEL - Abnormal; Notable for the following:    CO2 15 (*)    Glucose, Bld 150 (*)    BUN 71 (*)    Creatinine, Ser 5.93 (*)    Calcium 7.9 (*)    Albumin 2.5 (*)    AST 44 (*)    GFR calc non Af Amer 9 (*)    GFR calc Af Amer 11 (*)    All other components within normal limits  I-STAT ARTERIAL BLOOD GAS, ED - Abnormal; Notable for the following:    pH, Arterial 7.284 (*)    pCO2 arterial 27.3 (*)    pO2, Arterial 102.0 (*)    Bicarbonate 12.7 (*)    Acid-base deficit 12.0 (*)    All other components within normal limits  CULTURE, BLOOD (ROUTINE X 2)  CULTURE, BLOOD (ROUTINE X 2)  URINE CULTURE  URINALYSIS, ROUTINE W REFLEX MICROSCOPIC  I-STAT CG4 LACTIC ACID, ED  TYPE AND SCREEN    Imaging Review Dg Chest Port 1 View  10/30/2014   CLINICAL DATA:  Post sepsis. Shortness of breath and cough. White sputum and fever.  EXAM: PORTABLE CHEST - 1 VIEW  COMPARISON:  03/30/2012.  FINDINGS: Mild cardiac enlargement is noted. Bilateral, upper and lower lobe airspace opacities are identified left greater than right. No pleural effusion or edema noted. The visualized osseous structures are unremarkable.  IMPRESSION: 1. Bilateral, multi focal airspace opacities compatible with pneumonia.   Electronically Signed   By: Kerby Moors M.D.   On: 10/30/2014 10:07     EKG Interpretation   Date/Time:  Sunday October 30 2014 10:17:24 EDT Ventricular Rate:  90 PR Interval:  149 QRS Duration: 89 QT Interval:  349 QTC Calculation: 427 R Axis:   51 Text Interpretation:  Sinus rhythm Probable left atrial enlargement  Borderline low voltage, extremity leads No old tracing to compare  Confirmed by Evanell Redlich   MD, [place] ([id number]) on 10/30/2014 10:36:10 AM      MDM   Final diagnoses:  SOB (shortness of breath)  Sepsis, due to unspecified organism  Acute on chronic renal failure  Anemia, unspecified anemia type    The patient appears to have acute respiratory distress, we'll obtain an EKG, labs, chest x-ray, suspect pneumonia given the fever with a cough and rales however he also has signs of fluid overload and would consider chronic kidney disease and worsening renal failure with pulmonary edema.  Initiated sepsis management despite qSOFA score being low - he has SIRS criteria and a likely source with his lungs and abnormal  sounds - pna suspected.  Tachycardia, fever > 102 and tachypnea with hypoxia.   Labs show that the pt has no leukocytosis - he does have significant anemia - compared with prior labs he has both a new anemia and has worsening renal function - ABG shows acidosis.  He will need admission to high level of care - he has improved on Bipap but needing it for respiratory distress.  D/w hospitalist team - will admit to Step Down.  CRITICAL CARE Performed by: Johnna Acosta Total critical care time: 35 Critical care time was exclusive of separately billable procedures and treating other patients. Critical care was necessary to treat or prevent imminent or life-threatening deterioration. Critical care was time spent personally by me on the following activities: development of treatment plan with patient and/or surrogate as well as nursing, discussions with consultants, evaluation of patient's response to treatment, examination of patient, obtaining history from patient or surrogate, ordering and performing treatments and interventions, ordering and review of laboratory studies, ordering and review of radiographic studies, pulse oximetry and re-evaluation of patient's condition.  Meds given in ED:  Medications  cefTRIAXone (ROCEPHIN) 1 g in dextrose 5 % 50 mL IVPB (not administered)   azithromycin (ZITHROMAX) 500 mg in dextrose 5 % 250 mL IVPB (not administered)  albuterol (PROVENTIL) (2.5 MG/3ML) 0.083% nebulizer solution 5 mg (5 mg Nebulization Given 10/30/14 1105)  cefTRIAXone (ROCEPHIN) 1 g in dextrose 5 % 50 mL IVPB (0 g Intravenous Stopped 10/30/14 1048)  azithromycin (ZITHROMAX) 500 mg in dextrose 5 % 250 mL IVPB (0 mg Intravenous Stopped 10/30/14 1116)  acetaminophen (TYLENOL) tablet 1,000 mg (1,000 mg Oral Given 10/30/14 0953)     Noemi Chapel, MD 10/30/14 1128

## 2014-10-30 NOTE — ED Notes (Signed)
Pt. Is aware of needing a urine specimen

## 2014-10-31 DIAGNOSIS — J1100 Influenza due to unidentified influenza virus with unspecified type of pneumonia: Secondary | ICD-10-CM

## 2014-10-31 DIAGNOSIS — J189 Pneumonia, unspecified organism: Secondary | ICD-10-CM

## 2014-10-31 DIAGNOSIS — R0600 Dyspnea, unspecified: Secondary | ICD-10-CM

## 2014-10-31 DIAGNOSIS — D649 Anemia, unspecified: Secondary | ICD-10-CM

## 2014-10-31 LAB — URINE CULTURE: Colony Count: 8000

## 2014-10-31 LAB — COMPREHENSIVE METABOLIC PANEL
ALBUMIN: 2 g/dL — AB (ref 3.5–5.2)
ALT: 27 U/L (ref 0–53)
AST: 36 U/L (ref 0–37)
Alkaline Phosphatase: 52 U/L (ref 39–117)
Anion gap: 11 (ref 5–15)
BUN: 74 mg/dL — AB (ref 6–23)
CALCIUM: 7.8 mg/dL — AB (ref 8.4–10.5)
CO2: 15 mmol/L — ABNORMAL LOW (ref 19–32)
CREATININE: 6.08 mg/dL — AB (ref 0.50–1.35)
Chloride: 111 mmol/L (ref 96–112)
GFR calc Af Amer: 10 mL/min — ABNORMAL LOW (ref >90)
GFR, EST NON AFRICAN AMERICAN: 9 mL/min — AB (ref >90)
GLUCOSE: 75 mg/dL (ref 70–99)
POTASSIUM: 4.3 mmol/L (ref 3.5–5.1)
SODIUM: 137 mmol/L (ref 135–145)
TOTAL PROTEIN: 5 g/dL — AB (ref 6.0–8.3)
Total Bilirubin: 0.6 mg/dL (ref 0.3–1.2)

## 2014-10-31 LAB — IRON AND TIBC
Iron: 10 ug/dL — ABNORMAL LOW (ref 42–165)
Saturation Ratios: 6 % — ABNORMAL LOW (ref 20–55)
TIBC: 160 ug/dL — ABNORMAL LOW (ref 215–435)
UIBC: 150 ug/dL (ref 125–400)

## 2014-10-31 LAB — CBC
HCT: 20.6 % — ABNORMAL LOW (ref 39.0–52.0)
Hemoglobin: 7 g/dL — ABNORMAL LOW (ref 13.0–17.0)
MCH: 30.6 pg (ref 26.0–34.0)
MCHC: 34 g/dL (ref 30.0–36.0)
MCV: 90 fL (ref 78.0–100.0)
PLATELETS: 153 10*3/uL (ref 150–400)
RBC: 2.29 MIL/uL — ABNORMAL LOW (ref 4.22–5.81)
RDW: 13.9 % (ref 11.5–15.5)
WBC: 5.4 10*3/uL (ref 4.0–10.5)

## 2014-10-31 LAB — HIV ANTIBODY (ROUTINE TESTING W REFLEX): HIV Screen 4th Generation wRfx: NONREACTIVE

## 2014-10-31 LAB — GLUCOSE, CAPILLARY
GLUCOSE-CAPILLARY: 167 mg/dL — AB (ref 70–99)
GLUCOSE-CAPILLARY: 89 mg/dL (ref 70–99)
GLUCOSE-CAPILLARY: 138 mg/dL — AB (ref 70–99)
GLUCOSE-CAPILLARY: 104 mg/dL — AB (ref 70–99)
Glucose-Capillary: 150 mg/dL — ABNORMAL HIGH (ref 70–99)
Glucose-Capillary: 78 mg/dL (ref 70–99)
Glucose-Capillary: 81 mg/dL (ref 70–99)
Glucose-Capillary: 88 mg/dL (ref 70–99)
Glucose-Capillary: 125 mg/dL — ABNORMAL HIGH (ref 70–99)
Glucose-Capillary: 133 mg/dL — ABNORMAL HIGH (ref 70–99)

## 2014-10-31 LAB — HEMOGLOBIN A1C
HEMOGLOBIN A1C: 6.2 % — AB (ref 4.8–5.6)
MEAN PLASMA GLUCOSE: 131 mg/dL

## 2014-10-31 MED ORDER — INSULIN ASPART 100 UNIT/ML ~~LOC~~ SOLN
0.0000 [IU] | Freq: Three times a day (TID) | SUBCUTANEOUS | Status: DC
  Administered 2014-10-31 (×2): 1 [IU] via SUBCUTANEOUS
  Administered 2014-11-01: 2 [IU] via SUBCUTANEOUS
  Administered 2014-11-02: 3 [IU] via SUBCUTANEOUS
  Administered 2014-11-03: 2 [IU] via SUBCUTANEOUS
  Administered 2014-11-03: 1 [IU] via SUBCUTANEOUS
  Administered 2014-11-03: 2 [IU] via SUBCUTANEOUS
  Administered 2014-11-04 – 2014-11-05 (×2): 1 [IU] via SUBCUTANEOUS
  Administered 2014-11-05 – 2014-11-06 (×3): 2 [IU] via SUBCUTANEOUS
  Administered 2014-11-07: 1 [IU] via SUBCUTANEOUS
  Administered 2014-11-07: 2 [IU] via SUBCUTANEOUS
  Administered 2014-11-08: 1 [IU] via SUBCUTANEOUS
  Administered 2014-11-08: 3 [IU] via SUBCUTANEOUS
  Administered 2014-11-09: 5 [IU] via SUBCUTANEOUS

## 2014-10-31 MED ORDER — SODIUM BICARBONATE 650 MG PO TABS
1300.0000 mg | ORAL_TABLET | Freq: Two times a day (BID) | ORAL | Status: DC
  Administered 2014-10-31 – 2014-11-03 (×7): 1300 mg via ORAL
  Filled 2014-10-31 (×10): qty 2

## 2014-10-31 MED ORDER — ALBUTEROL SULFATE (2.5 MG/3ML) 0.083% IN NEBU: 2.5000 mg | INHALATION_SOLUTION | RESPIRATORY_TRACT | Status: DC | PRN

## 2014-10-31 MED ORDER — FUROSEMIDE 80 MG PO TABS
160.0000 mg | ORAL_TABLET | Freq: Two times a day (BID) | ORAL | Status: DC
  Administered 2014-10-31 – 2014-11-01 (×3): 160 mg via ORAL
  Filled 2014-10-31 (×6): qty 2

## 2014-10-31 MED ORDER — OSELTAMIVIR PHOSPHATE 30 MG PO CAPS
30.0000 mg | ORAL_CAPSULE | Freq: Every day | ORAL | Status: DC
  Administered 2014-10-31 – 2014-11-01 (×2): 30 mg via ORAL
  Filled 2014-10-31 (×4): qty 1

## 2014-10-31 MED ORDER — DARBEPOETIN ALFA 150 MCG/0.3ML IJ SOSY
150.0000 ug | PREFILLED_SYRINGE | INTRAMUSCULAR | Status: DC
Start: 2014-10-31 — End: 2014-11-10
  Administered 2014-10-31 – 2014-11-07 (×2): 150 ug via SUBCUTANEOUS
  Filled 2014-10-31 (×2): qty 0.3

## 2014-10-31 MED ORDER — ALBUTEROL SULFATE (2.5 MG/3ML) 0.083% IN NEBU
5.0000 mg | INHALATION_SOLUTION | Freq: Four times a day (QID) | RESPIRATORY_TRACT | Status: DC
  Administered 2014-10-31 (×3): 5 mg via RESPIRATORY_TRACT
  Filled 2014-10-31 (×4): qty 6

## 2014-10-31 NOTE — Progress Notes (Addendum)
[facility] PROGRESS NOTE  Brian Fuller [id number] DOB: 30-Jan-1951 DOA: 10/30/2014  PCP: Unknown  Brief HPI: 64 year old male of Hispanic origin with a past medical history of chronic kidney disease, stage V, who has been reluctant to initiate hemodialysis, diabetes, glaucoma, presented with shortness of breath and fever. He was found to have multifocal pneumonia. Thought to be bacterial. He was admitted to the step down unit. He required BiPAP initially.  Past medical history:  Past Medical History  Diagnosis Date  . Diabetes mellitus without complication   . Glaucoma   . Anemia   . Chronic kidney disease     Consultants: Nephrology  Procedures:  2-D echocardiogram is pending  Antibiotics: Vancomycin and Zosyn 3/13  Subjective: Patient is feeling better. Wife is at the bedside who was able to interpret.  Objective: Vital Signs  Filed Vitals:   10/30/14 2200 10/31/14 0026 10/31/14 0200 10/31/14 0450  BP: 149/70 154/69 144/69 141/64  Pulse: 69 69 69 73  Temp:  98.9 F (37.2 C)  98.5 F (36.9 C)  TempSrc:  Oral  Oral  Resp: 18 15 15 16   Height:      Weight:      SpO2: 100% 98% 100% 99%    Intake/Output Summary (Last 24 hours) at 10/31/14 0755 Last data filed at 10/31/14 0459  Gross per 24 hour  Intake    312 ml  Output   4150 ml  Net  -3838 ml   Filed Weights   10/30/14 0944  Weight: 65.5 kg (144 lb 6.4 oz)    General appearance: alert, cooperative, appears stated age and no distress Resp: Decreased air entry bilateral bases with a few crackles. No wheezing. Cardio: regular rate and rhythm, S1, S2 normal, no murmur, click, rub or gallop GI: soft, non-tender; bowel sounds normal; no masses,  no organomegaly Extremities: extremities normal, atraumatic, no cyanosis or edema Pulses: 2+ and symmetric Neurologic: Awake and alert. No focal deficits.  Lab Results:  Basic Metabolic Panel:  Recent Labs Lab 10/30/14 0930 10/30/14 1430  10/31/14 0446  NA 136  --  137  K 4.9  --  4.3  CL 110  --  111  CO2 15*  --  15*  GLUCOSE 150*  --  75  BUN 71*  --  74*  CREATININE 5.93* 5.98* 6.08*  CALCIUM 7.9*  --  7.8*   Liver Function Tests:  Recent Labs Lab 10/30/14 0930 10/31/14 0446  AST 44* 36  ALT 36 27  ALKPHOS 69 52  BILITOT 0.5 0.6  PROT 6.0 5.0*  ALBUMIN 2.5* 2.0*   CBC:  Recent Labs Lab 10/30/14 0930 10/31/14 0446  WBC 9.7 5.4  NEUTROABS 9.1*  --   HGB 7.5* 7.0*  HCT 23.0* 20.6*  MCV 94.3 90.0  PLT 171 153   CBG: No results for input(s): GLUCAP in the last 168 hours.  Recent Results (from the past 240 hour(s))  MRSA PCR Screening     Status: None   Collection Time: 10/30/14  1:05 PM  Result Value Ref Range Status   MRSA by PCR NEGATIVE NEGATIVE Final    Comment:        The GeneXpert MRSA Assay (FDA approved for NASAL specimens only), is one component of a comprehensive MRSA colonization surveillance program. It is not intended to diagnose MRSA infection nor to guide or monitor treatment for MRSA infections.       Studies/Results: Dg Chest Port 1 View  10/30/2014   CLINICAL  DATA:  Post sepsis. Shortness of breath and cough. White sputum and fever.  EXAM: PORTABLE CHEST - 1 VIEW  COMPARISON:  03/30/2012.  FINDINGS: Mild cardiac enlargement is noted. Bilateral, upper and lower lobe airspace opacities are identified left greater than right. No pleural effusion or edema noted. The visualized osseous structures are unremarkable.  IMPRESSION: 1. Bilateral, multi focal airspace opacities compatible with pneumonia.   Electronically Signed   By: Kerby Moors M.D.   On: 10/30/2014 10:07    Medications:  Scheduled: . albuterol  5 mg Nebulization Q6H  . carvedilol  6.25 mg Oral BID WC  . darbepoetin (ARANESP) injection - NON-DIALYSIS  150 mcg Subcutaneous Q Mon-1800  . docusate sodium  100 mg Oral BID  . furosemide  80 mg Intravenous Once  . furosemide  160 mg Oral BID  . heparin  5,000  Units Subcutaneous 3 times per day  . insulin aspart  0-9 Units Subcutaneous TID WC  . oseltamivir  30 mg Oral Daily  . piperacillin-tazobactam (ZOSYN)  IV  2.25 g Intravenous 3 times per day  . sodium bicarbonate  1,300 mg Oral BID  . sodium chloride  3 mL Intravenous Q12H  . 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 He has remained stable overnight. Did not require BiPAP through the night. Blood pressure heart rate are stable. Continue broad-spectrum antibiotics. Cultures are all pending.  Acute Hypoxic respiratory Failure Initially required BiPAP. This is most likely due to infectious process. No stable. Hasn't required BiPAP since last night. Okay for transfer to floor. There was also concern about some fluid overload for which he is receiving Lasix. We will repeat chest x-ray tomorrow.  Bilateral Multifocal PNA Antibiotics as above. Blood cultures, HIV, and Urine for legionella and strep pneumoniae antigens are pending. Influenza PCR was positive. Tamiflu has been initiated.  Influenza A Tamiflu has been initiated being dosed for renal function.  Acute on Chronic Renal Failure with volume overload and metabolic acidosis. Patient has been considering HD for some time. He saw Vascular in 11/15 and has been considering having access placed. He sees Dr. Posey Pronto.  Nephrology has been consulted. Currently on high-dose Lasix. Monitor urine output.  Questionable Type 2 Diabetes Mellitus No diabetic medications on his home medication list. Blood sugar is somewhat low this morning. We will cut back on his insulin coverage. Initiate diet. HbA1c 6.2.  Essential HTN Monitor blood pressures closely. Continue Coreg. Amlodipine is being held. Receiving high dose lasix.  Normocytic Anemia likely due to chronic disease Baseline Hgb 9.3. No overt bleeding has been noted. Hemoglobin has trended down. Likely secondary to acute sepsis and CKD. Transfuse if it drops any further.  DVT Prophylaxis: Heparin    Code Status: Full code  Family Communication: Wife at bedside Disposition Plan: Okay for transfer to floor.    LOS: 1 day   [place] [facility] Pager [phone] 10/31/2014, 7:55  AM  If 7PM-7AM, please contact night-coverage at [email or web address], password [facility]

## 2014-10-31 NOTE — Progress Notes (Signed)
  Echocardiogram 2D Echocardiogram has been performed.  Brian Fuller 10/31/2014, 12:33 PM

## 2014-10-31 NOTE — Progress Notes (Signed)
Subjective: Interval History: has no complaint, friend will be here this am who speaks [place].  Objective: Vital signs in last 24 hours: Temp:  [97.9 F (36.6 C)-102 F (38.9 C)] 98.5 F (36.9 C) (03/14 0450) Pulse Rate:  [67-100] 73 (03/14 0450) Resp:  [15-33] 16 (03/14 0450) BP: (117-173)/(52-76) 141/64 mmHg (03/14 0450) SpO2:  [87 %-100 %] 99 % (03/14 0450) FiO2 (%):  [40 %] 40 % (03/13 1105) Weight:  [65.5 kg (144 lb 6.4 oz)] 65.5 kg (144 lb 6.4 oz) (03/13 0944) Weight change:   Intake/Output from previous day: 03/13 0701 - 03/14 0700 In: 200 [IV Piggyback:200] Out: 4150 [Urine:4150] Intake/Output this shift:    General appearance: alert, cooperative and no distress Resp: rales bibasilar, rhonchi bibasilar and wheezes bilaterally Cardio: S1, S2 normal and systolic murmur: holosystolic 2/6, blowing at apex GI: pos bs, liver down 4 cm, soft Extremities: edema 1-2+  Lab Results:  Recent Labs  10/30/14 0930 10/31/14 0446  WBC 9.7 5.4  HGB 7.5* 7.0*  HCT 23.0* 20.6*  PLT 171 153   BMET:  Recent Labs  10/30/14 0930 10/30/14 1430 10/31/14 0446  NA 136  --  137  K 4.9  --  4.3  CL 110  --  111  CO2 15*  --  15*  GLUCOSE 150*  --  75  BUN 71*  --  74*  CREATININE 5.93* 5.98* 6.08*  CALCIUM 7.9*  --  7.8*   No results for input(s): PTH in the last 72 hours. Iron Studies: No results for input(s): IRON, TIBC, TRANSFERRIN, FERRITIN in the last 72 hours.  Studies/Results: Dg Chest Port 1 View  10/30/2014   CLINICAL DATA:  Post sepsis. Shortness of breath and cough. White sputum and fever.  EXAM: PORTABLE CHEST - 1 VIEW  COMPARISON:  03/30/2012.  FINDINGS: Mild cardiac enlargement is noted. Bilateral, upper and lower lobe airspace opacities are identified left greater than right. No pleural effusion or edema noted. The visualized osseous structures are unremarkable.  IMPRESSION: 1. Bilateral, multi focal airspace opacities compatible with pneumonia.   Electronically  Signed   By: Kerby Moors M.D.   On: 10/30/2014 10:07    I have reviewed the patient's current medications.  Assessment/Plan: 1 CKD 5 vol xs, acidemic , GFR is about 10. Not uremic,will discuss access,.need diuresis, bicarb 2 Anemia will check Fe, Give epo 3 HPTH get PTH 4 DM controlled 5 Pneu 6 HTN P save arm, speak through interpret, control DM, epo    LOS: 1 day   Marshell Dilauro L 10/31/2014,7:23 AM

## 2014-11-01 ENCOUNTER — Inpatient Hospital Stay (HOSPITAL_COMMUNITY): Payer: Medicaid Other

## 2014-11-01 DIAGNOSIS — N185 Chronic kidney disease, stage 5: Secondary | ICD-10-CM

## 2014-11-01 LAB — COMPREHENSIVE METABOLIC PANEL
ALBUMIN: 2 g/dL — AB (ref 3.5–5.2)
ALT: 25 U/L (ref 0–53)
AST: 35 U/L (ref 0–37)
Alkaline Phosphatase: 52 U/L (ref 39–117)
Anion gap: 10 (ref 5–15)
BUN: 82 mg/dL — ABNORMAL HIGH (ref 6–23)
CHLORIDE: 104 mmol/L (ref 96–112)
CO2: 21 mmol/L (ref 19–32)
Calcium: 7.1 mg/dL — ABNORMAL LOW (ref 8.4–10.5)
Creatinine, Ser: 6.72 mg/dL — ABNORMAL HIGH (ref 0.50–1.35)
GFR calc Af Amer: 9 mL/min — ABNORMAL LOW (ref >90)
GFR, EST NON AFRICAN AMERICAN: 8 mL/min — AB (ref >90)
Glucose, Bld: 103 mg/dL — ABNORMAL HIGH (ref 70–99)
Potassium: 3.5 mmol/L (ref 3.5–5.1)
SODIUM: 135 mmol/L (ref 135–145)
Total Bilirubin: 0.5 mg/dL (ref 0.3–1.2)
Total Protein: 4.9 g/dL — ABNORMAL LOW (ref 6.0–8.3)

## 2014-11-01 LAB — GLUCOSE, CAPILLARY
GLUCOSE-CAPILLARY: 183 mg/dL — AB (ref 70–99)
GLUCOSE-CAPILLARY: 154 mg/dL — AB (ref 70–99)
Glucose-Capillary: 122 mg/dL — ABNORMAL HIGH (ref 70–99)
Glucose-Capillary: 118 mg/dL — ABNORMAL HIGH (ref 70–99)

## 2014-11-01 LAB — CBC
HCT: 19.6 % — ABNORMAL LOW (ref 39.0–52.0)
HEMOGLOBIN: 6.6 g/dL — AB (ref 13.0–17.0)
MCH: 29.9 pg (ref 26.0–34.0)
MCHC: 33.7 g/dL (ref 30.0–36.0)
MCV: 88.7 fL (ref 78.0–100.0)
PLATELETS: 147 10*3/uL — AB (ref 150–400)
RBC: 2.21 MIL/uL — AB (ref 4.22–5.81)
RDW: 13.8 % (ref 11.5–15.5)
WBC: 3.2 10*3/uL — ABNORMAL LOW (ref 4.0–10.5)

## 2014-11-01 LAB — LACTIC ACID, PLASMA: LACTIC ACID, VENOUS: 0.6 mmol/L (ref 0.5–2.0)

## 2014-11-01 LAB — STREP PNEUMONIAE URINARY ANTIGEN: STREP PNEUMO URINARY ANTIGEN: NEGATIVE

## 2014-11-01 LAB — PREPARE RBC (CROSSMATCH)

## 2014-11-01 LAB — PARATHYROID HORMONE, INTACT (NO CA): PTH: 66 pg/mL — ABNORMAL HIGH (ref 15–65)

## 2014-11-01 LAB — PHOSPHORUS: Phosphorus: 6.8 mg/dL — ABNORMAL HIGH (ref 2.3–4.6)

## 2014-11-01 MED ORDER — CALCIUM ACETATE (PHOS BINDER) 667 MG PO CAPS
667.0000 mg | ORAL_CAPSULE | Freq: Three times a day (TID) | ORAL | Status: DC
  Administered 2014-11-01 – 2014-11-10 (×22): 667 mg via ORAL
  Filled 2014-11-01 (×28): qty 1

## 2014-11-01 MED ORDER — SODIUM CHLORIDE 0.9 % IV SOLN
125.0000 mg | Freq: Every day | INTRAVENOUS | Status: DC
  Administered 2014-11-01: 125 mg via INTRAVENOUS
  Filled 2014-11-01 (×3): qty 10

## 2014-11-01 MED ORDER — ALBUTEROL SULFATE (2.5 MG/3ML) 0.083% IN NEBU
5.0000 mg | INHALATION_SOLUTION | Freq: Three times a day (TID) | RESPIRATORY_TRACT | Status: DC
  Administered 2014-11-01 – 2014-11-04 (×6): 5 mg via RESPIRATORY_TRACT
  Administered 2014-11-04: 2.5 mg via RESPIRATORY_TRACT
  Filled 2014-11-01 (×10): qty 6

## 2014-11-01 MED ORDER — SODIUM CHLORIDE 0.9 % IV SOLN
Freq: Once | INTRAVENOUS | Status: AC
  Administered 2014-11-01: 12:00:00 via INTRAVENOUS

## 2014-11-01 NOTE — Progress Notes (Signed)
CRITICAL VALUE ALERT  Critical value received:  Hemoglobin 6.6  Date of notification:  11/01/14  Time of notification:  0642  Critical value read back:Yes.    Nurse who received alert:  Dorothea Glassman, RN  MD notified (1st page):  S. Alene Mires- PA  Time of first page:  [phone]  Responding MD:  Jyl Heinz  Time MD responded:  [phone]

## 2014-11-01 NOTE — Progress Notes (Signed)
Shift Event Pt with Hgb of 6.6. Pt with hx of anemia. No overt/hx of bleed, other VSS, asymptomatic. Transfuse 1 U of PRBC. Will check Lactic acid  Lacy Duverney [facility]

## 2014-11-01 NOTE — Consult Note (Signed)
Consult Note  Patient name: Brian Fuller MRN: [id number] DOB: 03-13-1951 Sex: male  Consulting Physician:  Renal  Reason for Consult:  Chief Complaint  Patient presents with  . Shortness of Breath  . Fever    HISTORY OF PRESENT ILLNESS: This is a 64 year old gentleman who was admitted to the hospital with sepsis and multifocal pneumonia and respiratory failure.  He was volume overloaded.  He has a history of renal insufficiency.  He was seen by Dr. early several months ago and scheduled for left-sided fistula but this never occurred.  We are asked to provide permanent access.  He is right-handed.  The patient's renal failure secondary to diabetes.  He is a nonsmoker.  He is Spanish only speaking  Past Medical History  Diagnosis Date  . Diabetes mellitus without complication   . Glaucoma   . Anemia   . Chronic kidney disease     Past Surgical History  Procedure Laterality Date  . Eye surgery      History   Social History  . Marital Status: Married    Spouse Name: N/A  . Number of Children: N/A  . Years of Education: N/A   Occupational History  . Not on file.   Social History Main Topics  . Smoking status: Never Smoker   . Smokeless tobacco: Not on file  . Alcohol Use: No  . Drug Use: Not on file  . Sexual Activity: Not on file   Other Topics Concern  . Not on file   Social History Narrative    Family History  Problem Relation Age of Onset  . Diabetes Mother   . Diabetes Brother   . Diabetes Father   . Hypertension Sister     Allergies as of 10/30/2014  . (No Known Allergies)    No current facility-administered medications on file prior to encounter.   Current Outpatient Prescriptions on File Prior to Encounter  Medication Sig Dispense Refill  . amLODipine (NORVASC) 10 MG tablet Take 1 tablet (10 mg total) by mouth daily. 30 tablet 3  . calcitRIOL (ROCALTROL) 0.25 MCG capsule Take 1 capsule (0.25 mcg total) by mouth daily. 30  capsule 3  . carvedilol (COREG) 6.25 MG tablet Take 1 tablet (6.25 mg total) by mouth 2 (two) times daily with a meal. 60 tablet 3  . furosemide (LASIX) 20 MG tablet Take 20 mg by mouth daily.    . traMADol (ULTRAM) 50 MG tablet Take 1 tablet (50 mg total) by mouth every 8 (eight) hours as needed for moderate pain. (Patient not taking: Reported on 10/30/2014) 30 tablet 0     REVIEW OF SYSTEMS: Cardiovascular: No chest pain, chest pressure, palpitations Pulmonary: Breathing improved Neurologic: No weakness, paresthesias, aphasia, or amaurosis. No dizziness. Hematologic: No bleeding problems or clotting disorders. Musculoskeletal: No joint pain or joint swelling. Gastrointestinal: No blood in stool or hematemesis Genitourinary: No dysuria or hematuria. Psychiatric:: No history of major depression. Integumentary: No rashes or ulcers. Constitutional: No fever or chills.  PHYSICAL EXAMINATION: General: The patient appears their stated age.  Vital signs are BP 171/64 mmHg  Pulse 71  Temp(Src) 99.1 F (37.3 C) (Oral)  Resp 19  Ht 5\' 5"  (1.651 m)  Wt 135 lb 3.2 oz (61.326 kg)  BMI 22.50 kg/m2  SpO2 94% Pulmonary: Respirations are non-labored HEENT:  No gross abnormalities Musculoskeletal: There are no major deformities.   Neurologic: No focal weakness or paresthesias are detected, Skin: There are  no ulcer or rashes noted. Psychiatric: The patient has normal affect. Cardiovascular: There is a regular rate and rhythm without significant murmur appreciated. palpable left radial pulse.  Prominent left cephalic vein visualized throughout the upper arm  Diagnostic Studies:  the patient has vein mapping from our office in November which shows an adequate left cephalic vein    Assessment:  ESRD Plan: I discussed with the patient via a spanish speaking friend.I discussed the risks and benefits of the operation including the need for future surgeries.  He understands the details and the risks.   They're familiar with dialysis as they have had a friend require this in the past.  QUESTIONS were answered.  He will be scheduled for a left radiocephalic, possible brachiocephalic fistula tomorrow morning.  There is an IV in his antecubital crease which needs to be removed.  He will be nothing by mouth midnight      V. Leia Alf, M.D. [facility] Office: [phone] Pager:  [phone]

## 2014-11-01 NOTE — Progress Notes (Addendum)
[facility] PROGRESS NOTE  Brian Fuller [id number] DOB: 1951/07/11 DOA: 10/30/2014  PCP: Unknown  Brief HPI: 64 year old male of Hispanic origin with a past medical history of chronic kidney disease, stage V, who has been reluctant to initiate hemodialysis, diabetes, glaucoma, presented with shortness of breath and fever. He was found to have multifocal pneumonia. Thought to be bacterial. He was admitted to the step down unit. He required BiPAP initially.  Past medical history:  Past Medical History  Diagnosis Date  . Diabetes mellitus without complication   . Glaucoma   . Anemia   . Chronic kidney disease     Consultants: Nephrology  Procedures:  2-D echocardiogram  Study Conclusions - Left ventricle: The cavity size was normal. Wall thickness wasincreased in a pattern of moderate LVH. Systolic function wasnormal. The estimated ejection fraction was in the range of 50%to 55%. Wall motion was normal; there were no regional wallmotion abnormalities. - Mitral valve: There was mild regurgitation. - Left atrium: The atrium was mildly dilated. - Pericardium, extracardiac: A small pericardial effusion wasidentified. There was no evidence of hemodynamic compromise.  Antibiotics: Vancomycin and Zosyn 3/13  Subjective: Patient feels better. Wife is at the bedside who was able to interpret to a limited extent.  Objective: Vital Signs  Filed Vitals:   10/31/14 1952 10/31/14 2238 10/31/14 2315 11/01/14 0420  BP:  152/63  128/73  Pulse: 78 79  70  Temp:  101 F (38.3 C) 98 F (36.7 C) 98 F (36.7 C)  TempSrc:  Oral  Oral  Resp: 20 20  18   Height:      Weight:  61.326 kg (135 lb 3.2 oz)    SpO2: 98% 95%  98%    Intake/Output Summary (Last 24 hours) at 11/01/14 0802 Last data filed at 11/01/14 0615  Gross per 24 hour  Intake    760 ml  Output   2780 ml  Net  -2020 ml   Filed Weights   10/30/14 0944 10/31/14 2238  Weight: 65.5 kg (144 lb 6.4 oz)  61.326 kg (135 lb 3.2 oz)    General appearance: alert, cooperative, appears stated age and no distress Resp: Decreased air entry bilateral bases with a few crackles. No wheezing. Cardio: regular rate and rhythm, S1, S2 normal, no murmur, click, rub or gallop GI: soft, non-tender; bowel sounds normal; no masses,  no organomegaly Extremities: extremities normal, atraumatic, no cyanosis or edema Neurologic: Awake and alert. No focal deficits.  Lab Results:  Basic Metabolic Panel:  Recent Labs Lab 10/30/14 0930 10/30/14 1430 10/31/14 0446 11/01/14 0535  NA 136  --  137 135  K 4.9  --  4.3 3.5  CL 110  --  111 104  CO2 15*  --  15* 21  GLUCOSE 150*  --  75 103*  BUN 71*  --  74* 82*  CREATININE 5.93* 5.98* 6.08* 6.72*  CALCIUM 7.9*  --  7.8* 7.1*  PHOS  --   --   --  6.8*   Liver Function Tests:  Recent Labs Lab 10/30/14 0930 10/31/14 0446 11/01/14 0535  AST 44* 36 35  ALT 36 27 25  ALKPHOS 69 52 52  BILITOT 0.5 0.6 0.5  PROT 6.0 5.0* 4.9*  ALBUMIN 2.5* 2.0* 2.0*   CBC:  Recent Labs Lab 10/30/14 0930 10/31/14 0446 11/01/14 0535  WBC 9.7 5.4 3.2*  NEUTROABS 9.1*  --   --   HGB 7.5* 7.0* 6.6*  HCT 23.0* 20.6* 19.6*  MCV 94.3 90.0 88.7  PLT 171 153 147*   CBG:  Recent Labs Lab 10/31/14 0803 10/31/14 1140 10/31/14 1545 10/31/14 1724 10/31/14 2127  GLUCAP 81 138* 133* 125* 104*    Recent Results (from the past 240 hour(s))  Blood Culture (routine x 2)     Status: None (Preliminary result)   Collection Time: 10/30/14  9:30 AM  Result Value Ref Range Status   Specimen Description BLOOD RIGHT FOREARM  Final   Special Requests BOTTLES DRAWN AEROBIC AND ANAEROBIC 5CC  Final   Culture   Final           BLOOD CULTURE RECEIVED NO GROWTH TO DATE CULTURE WILL BE HELD FOR 5 DAYS BEFORE ISSUING A FINAL NEGATIVE REPORT Performed at [facility]    Report Status PENDING  Incomplete  Blood Culture (routine x 2)     Status: None (Preliminary result)    Collection Time: 10/30/14  9:36 AM  Result Value Ref Range Status   Specimen Description BLOOD LEFT ARM  Final   Special Requests BOTTLES DRAWN AEROBIC AND ANAEROBIC 5CC  Final   Culture   Final           BLOOD CULTURE RECEIVED NO GROWTH TO DATE CULTURE WILL BE HELD FOR 5 DAYS BEFORE ISSUING A FINAL NEGATIVE REPORT Performed at [facility]    Report Status PENDING  Incomplete  MRSA PCR Screening     Status: None   Collection Time: 10/30/14  1:05 PM  Result Value Ref Range Status   MRSA by PCR NEGATIVE NEGATIVE Final    Comment:        The GeneXpert MRSA Assay (FDA approved for NASAL specimens only), is one component of a comprehensive MRSA colonization surveillance program. It is not intended to diagnose MRSA infection nor to guide or monitor treatment for MRSA infections.   Urine culture     Status: None   Collection Time: 10/30/14  1:40 PM  Result Value Ref Range Status   Specimen Description URINE, CLEAN CATCH  Final   Special Requests NONE  Final   Colony Count   Final    8,000 COLONIES/ML Performed at [facility]    Culture   Final    INSIGNIFICANT GROWTH Performed at [facility]    Report Status 10/31/2014 FINAL  Final      Studies/Results: Dg Chest Port 1 View  10/30/2014   CLINICAL DATA:  Post sepsis. Shortness of breath and cough. White sputum and fever.  EXAM: PORTABLE CHEST - 1 VIEW  COMPARISON:  03/30/2012.  FINDINGS: Mild cardiac enlargement is noted. Bilateral, upper and lower lobe airspace opacities are identified left greater than right. No pleural effusion or edema noted. The visualized osseous structures are unremarkable.  IMPRESSION: 1. Bilateral, multi focal airspace opacities compatible with pneumonia.   Electronically Signed   By: Kerby Moors M.D.   On: 10/30/2014 10:07    Medications:  Scheduled: . sodium chloride   Intravenous Once  . albuterol  5 mg Nebulization TID  . carvedilol  6.25 mg Oral BID WC  .  darbepoetin (ARANESP) injection - NON-DIALYSIS  150 mcg Subcutaneous Q Mon-1800  . docusate sodium  100 mg Oral BID  . furosemide  80 mg Intravenous Once  . furosemide  160 mg Oral BID  . heparin  5,000 Units Subcutaneous 3 times per day  . insulin aspart  0-9 Units Subcutaneous TID WC  . oseltamivir  30 mg Oral Daily  .  piperacillin-tazobactam (ZOSYN)  IV  2.25 g Intravenous 3 times per day  . sodium bicarbonate  1,300 mg Oral BID  . sodium chloride  3 mL Intravenous Q12H  . 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 He should remain stable. Required BiPAP initially but not in the last 36 hours. Continue broad-spectrum antibiotics. Cultures are all pending. De-escalate antibiotics based on culture data.  Acute Hypoxic respiratory Failure Initially required BiPAP. Now stable. This is most likely due to infectious process. There was also concern about some fluid overload for which he is receiving Lasix. Repeat chest x-ray is pending.  Bilateral Multifocal PNA Antibiotics as above. Blood cultures are pending. HIV is nonreactive. Urine for legionella and strep pneumoniae antigens are pending. Influenza PCR was positive. Tamiflu has been initiated.  Influenza A Tamiflu has been initiated being dosed for renal function.  Acute on  Chronic Renal Failure with volume overload and metabolic acidosis. Patient has been considering HD for some time. He saw Vascular in 11/15 and has been considering having access placed. He sees Dr. Posey Pronto. Nephrology has been consulted. Currently on high-dose Lasix. Monitor urine output. On oral bicarbonate as well.  Questionable Type 2 Diabetes Mellitus No diabetic medications on his home medication list. HbA1c 6.2.  Essential HTN Monitor blood pressures closely. Continue Coreg. Amlodipine is being held. Receiving high dose lasix.  Normocytic Anemia likely due to chronic disease Baseline Hgb 9.3. IMA Global 6.6 today. One unit of blood transfusion has been ordered. Anemia is most likely due to chronic disease. No overt blood loss has been noted. Check stool for occult blood.  DVT Prophylaxis: Heparin    Code Status: Full code  Family Communication: Wife at bedside Disposition Plan: Not ready for discharge. Mobilize.    LOS: 2 days   [facility] Pager [phone] 11/01/2014, 8:02 AM  If 7PM-7AM, please contact night-coverage at [email or web address], password [facility]

## 2014-11-01 NOTE — Progress Notes (Signed)
Subjective:  Diuresed 2 liters overnight- creatinine now up to high 6's- hgb is the same- getting one unit of blood- denies uremic symptoms Objective Vital signs in last 24 hours: Filed Vitals:   11/01/14 0936 11/01/14 1010 11/01/14 1100 11/01/14 1137  BP:  136/63 129/59 154/61  Pulse: 78 74 75   Temp:  98.6 F (37 C) 98.3 F (36.8 C) 98.4 F (36.9 C)  TempSrc:  Oral Oral Oral  Resp: 20 18 17 18   Height:      Weight:      SpO2: 98% 95% 93% 95%   Weight change: -4.174 kg (-9 lb 3.2 oz)  Intake/Output Summary (Last 24 hours) at 11/01/14 1139 Last data filed at 11/01/14 1122  Gross per 24 hour  Intake    720 ml  Output   2100 ml  Net  -1380 ml    Assessment/ Plan: Pt is a 64 y.o. yo male who was admitted on 10/30/2014 with  SOB- known to have stage 4/5 CKD  Assessment/Plan: 1. Renal- stage 4/5 CKD as OP approaching need for dialysis.  Patient has been seen by [facility] in the past and has had vein mapping but refused to have surgery in the past.  I have recommended that we at least plan to do that while he is an inpatient - he consents through an interpreter at this time.  He is not currently uremic so there is chance that he could make it the 2-3 months before dialysis would need to be inititiated as an OP- however, I cannot guarantee that he would not need a PC at some point with initiation of HD.  2. HTN/volume- volume status seems fine.  I am going to stop the high dose lasix and remove his foley catheter 3. Anemia- has dropped while inpatient- getting one unit of blood today- will also give iron - ESA has been started  4. Secondary hyperparathyroidism- PTH is 66- phos is high, will start phoslo  5. Metabolic acidosis- better on bicarb  Pailynn Vahey A    Labs: Basic Metabolic Panel:  Recent Labs Lab 10/30/14 0930 10/30/14 1430 10/31/14 0446 11/01/14 0535  NA 136  --  137 135  K 4.9  --  4.3 3.5  CL 110  --  111 104  CO2 15*  --  15* 21  GLUCOSE 150*  --  75 103*   BUN 71*  --  74* 82*  CREATININE 5.93* 5.98* 6.08* 6.72*  CALCIUM 7.9*  --  7.8* 7.1*  PHOS  --   --   --  6.8*   Liver Function Tests:  Recent Labs Lab 10/30/14 0930 10/31/14 0446 11/01/14 0535  AST 44* 36 35  ALT 36 27 25  ALKPHOS 69 52 52  BILITOT 0.5 0.6 0.5  PROT 6.0 5.0* 4.9*  ALBUMIN 2.5* 2.0* 2.0*   No results for input(s): LIPASE, AMYLASE in the last 168 hours. No results for input(s): AMMONIA in the last 168 hours. CBC:  Recent Labs Lab 10/30/14 0930 10/31/14 0446 11/01/14 0535  WBC 9.7 5.4 3.2*  NEUTROABS 9.1*  --   --   HGB 7.5* 7.0* 6.6*  HCT 23.0* 20.6* 19.6*  MCV 94.3 90.0 88.7  PLT 171 153 147*   Cardiac Enzymes: No results for input(s): CKTOTAL, CKMB, CKMBINDEX, TROPONINI in the last 168 hours. CBG:  Recent Labs Lab 10/31/14 1140 10/31/14 1545 10/31/14 1724 10/31/14 2127 11/01/14 1008  GLUCAP 138* 133* 125* 104* 122*    Iron Studies:  Recent Labs  10/31/14 0808  IRON 10*  TIBC 160*   Studies/Results: No results found. Medications: Infusions:    Scheduled Medications: . albuterol  5 mg Nebulization TID  . carvedilol  6.25 mg Oral BID WC  . darbepoetin (ARANESP) injection - NON-DIALYSIS  150 mcg Subcutaneous Q Mon-1800  . docusate sodium  100 mg Oral BID  . furosemide  80 mg Intravenous Once  . furosemide  160 mg Oral BID  . heparin  5,000 Units Subcutaneous 3 times per day  . insulin aspart  0-9 Units Subcutaneous TID WC  . oseltamivir  30 mg Oral Daily  . piperacillin-tazobactam (ZOSYN)  IV  2.25 g Intravenous 3 times per day  . sodium bicarbonate  1,300 mg Oral BID  . sodium chloride  3 mL Intravenous Q12H  . vancomycin  1,000 mg Intravenous Q48H    have reviewed scheduled and prn medications.  Physical Exam: General: NAD Heart: RRR Lungs: clear Abdomen: soft, non tender Extremities: really minimal edema    11/01/2014,11:39 AM  LOS: 2 days

## 2014-11-02 ENCOUNTER — Inpatient Hospital Stay (HOSPITAL_COMMUNITY): Payer: Medicaid Other | Admitting: Anesthesiology

## 2014-11-02 ENCOUNTER — Other Ambulatory Visit: Payer: Self-pay | Admitting: *Deleted

## 2014-11-02 ENCOUNTER — Encounter (HOSPITAL_COMMUNITY)
Admission: EM | Disposition: A | Discharge status: Home / Self Care | Payer: Self-pay | Source: Home / Self Care | Attending: Internal Medicine

## 2014-11-02 ENCOUNTER — Encounter (HOSPITAL_COMMUNITY): Payer: Self-pay | Admitting: Anesthesiology

## 2014-11-02 ENCOUNTER — Telehealth: Payer: Self-pay | Admitting: Vascular Surgery

## 2014-11-02 DIAGNOSIS — N186 End stage renal disease: Secondary | ICD-10-CM

## 2014-11-02 DIAGNOSIS — Z4931 Encounter for adequacy testing for hemodialysis: Secondary | ICD-10-CM

## 2014-11-02 DIAGNOSIS — N189 Chronic kidney disease, unspecified: Secondary | ICD-10-CM

## 2014-11-02 DIAGNOSIS — N179 Acute kidney failure, unspecified: Secondary | ICD-10-CM

## 2014-11-02 HISTORY — PX: AV FISTULA PLACEMENT: SHX1204

## 2014-11-02 LAB — CBC
HEMATOCRIT: 23.7 % — AB (ref 39.0–52.0)
HEMOGLOBIN: 8.3 g/dL — AB (ref 13.0–17.0)
MCH: 30.6 pg (ref 26.0–34.0)
MCHC: 35 g/dL (ref 30.0–36.0)
MCV: 87.5 fL (ref 78.0–100.0)
Platelets: 155 10*3/uL (ref 150–400)
RBC: 2.71 MIL/uL — ABNORMAL LOW (ref 4.22–5.81)
RDW: 14.1 % (ref 11.5–15.5)
WBC: 3.6 10*3/uL — ABNORMAL LOW (ref 4.0–10.5)

## 2014-11-02 LAB — TYPE AND SCREEN
ABO/RH(D): O POS
ANTIBODY SCREEN: NEGATIVE
UNIT DIVISION: 0

## 2014-11-02 LAB — BASIC METABOLIC PANEL
ANION GAP: 11 (ref 5–15)
BUN: 85 mg/dL — ABNORMAL HIGH (ref 6–23)
CHLORIDE: 101 mmol/L (ref 96–112)
CO2: 22 mmol/L (ref 19–32)
Calcium: 7.2 mg/dL — ABNORMAL LOW (ref 8.4–10.5)
Creatinine, Ser: 6.83 mg/dL — ABNORMAL HIGH (ref 0.50–1.35)
GFR calc Af Amer: 9 mL/min — ABNORMAL LOW (ref >90)
GFR calc non Af Amer: 8 mL/min — ABNORMAL LOW (ref >90)
Glucose, Bld: 193 mg/dL — ABNORMAL HIGH (ref 70–99)
Potassium: 3.2 mmol/L — ABNORMAL LOW (ref 3.5–5.1)
SODIUM: 134 mmol/L — AB (ref 135–145)

## 2014-11-02 LAB — LEGIONELLA ANTIGEN, URINE

## 2014-11-02 LAB — PROTIME-INR
INR: 1.04 (ref 0.00–1.49)
Prothrombin Time: 13.7 seconds (ref 11.6–15.2)

## 2014-11-02 LAB — GLUCOSE, CAPILLARY
GLUCOSE-CAPILLARY: 124 mg/dL — AB (ref 70–99)
GLUCOSE-CAPILLARY: 214 mg/dL — AB (ref 70–99)
Glucose-Capillary: 102 mg/dL — ABNORMAL HIGH (ref 70–99)
Glucose-Capillary: 210 mg/dL — ABNORMAL HIGH (ref 70–99)

## 2014-11-02 SURGERY — ARTERIOVENOUS (AV) FISTULA CREATION
Anesthesia: Monitor Anesthesia Care | Site: Arm Lower | Laterality: Left

## 2014-11-02 MED ORDER — AMOXICILLIN-POT CLAVULANATE 875-125 MG PO TABS: 1.0000 | ORAL_TABLET | Freq: Two times a day (BID) | ORAL | Status: DC

## 2014-11-02 MED ORDER — LIDOCAINE-EPINEPHRINE (PF) 1 %-1:200000 IJ SOLN
INTRAMUSCULAR | Status: AC
  Filled 2014-11-02: qty 10

## 2014-11-02 MED ORDER — OXYCODONE HCL 5 MG/5ML PO SOLN: 5.0000 mg | Freq: Once | ORAL | Status: DC | PRN

## 2014-11-02 MED ORDER — PHENYLEPHRINE 40 MCG/ML (10ML) SYRINGE FOR IV PUSH (FOR BLOOD PRESSURE SUPPORT)
PREFILLED_SYRINGE | INTRAVENOUS | Status: AC
  Filled 2014-11-02: qty 10

## 2014-11-02 MED ORDER — SODIUM CHLORIDE 0.9 % IV SOLN
INTRAVENOUS | Status: DC | PRN
  Administered 2014-11-02: 09:00:00 via INTRAVENOUS

## 2014-11-02 MED ORDER — PROMETHAZINE HCL 25 MG/ML IJ SOLN: 6.2500 mg | INTRAMUSCULAR | Status: DC | PRN

## 2014-11-02 MED ORDER — SODIUM CHLORIDE 0.9 % IJ SOLN
INTRAMUSCULAR | Status: AC
  Filled 2014-11-02: qty 10

## 2014-11-02 MED ORDER — OXYCODONE HCL 5 MG PO TABS: 5.0000 mg | ORAL_TABLET | ORAL | Status: DC | PRN

## 2014-11-02 MED ORDER — 0.9 % SODIUM CHLORIDE (POUR BTL) OPTIME
TOPICAL | Status: DC | PRN
  Administered 2014-11-02: 1000 mL

## 2014-11-02 MED ORDER — PROPOFOL 10 MG/ML IV BOLUS
INTRAVENOUS | Status: AC
  Filled 2014-11-02: qty 20

## 2014-11-02 MED ORDER — POTASSIUM CHLORIDE CRYS ER 20 MEQ PO TBCR
40.0000 meq | EXTENDED_RELEASE_TABLET | Freq: Once | ORAL | Status: AC
  Administered 2014-11-02: 40 meq via ORAL
  Filled 2014-11-02: qty 2

## 2014-11-02 MED ORDER — LIDOCAINE-EPINEPHRINE (PF) 1 %-1:200000 IJ SOLN
INTRAMUSCULAR | Status: DC | PRN
  Administered 2014-11-02: 10 mL

## 2014-11-02 MED ORDER — SUCCINYLCHOLINE CHLORIDE 20 MG/ML IJ SOLN
INTRAMUSCULAR | Status: AC
  Filled 2014-11-02: qty 1

## 2014-11-02 MED ORDER — SODIUM CHLORIDE 0.9 % IR SOLN
Status: DC | PRN
  Administered 2014-11-02: 09:00:00

## 2014-11-02 MED ORDER — OXYCODONE HCL 5 MG PO TABS: 5.0000 mg | ORAL_TABLET | Freq: Once | ORAL | Status: DC | PRN

## 2014-11-02 MED ORDER — NA FERRIC GLUC CPLX IN SUCROSE 12.5 MG/ML IV SOLN
250.0000 mg | Freq: Every day | INTRAVENOUS | Status: AC
  Administered 2014-11-03: 250 mg via INTRAVENOUS
  Filled 2014-11-02 (×2): qty 20

## 2014-11-02 MED ORDER — ONDANSETRON HCL 4 MG/2ML IJ SOLN
INTRAMUSCULAR | Status: DC | PRN
  Administered 2014-11-02: 4 mg via INTRAVENOUS

## 2014-11-02 MED ORDER — LIDOCAINE HCL (CARDIAC) 20 MG/ML IV SOLN
INTRAVENOUS | Status: AC
  Filled 2014-11-02: qty 5

## 2014-11-02 MED ORDER — AMOXICILLIN-POT CLAVULANATE 500-125 MG PO TABS
1.0000 | ORAL_TABLET | ORAL | Status: DC
  Administered 2014-11-03 – 2014-11-05 (×3): 500 mg via ORAL
  Filled 2014-11-02 (×4): qty 1

## 2014-11-02 MED ORDER — MIDAZOLAM HCL 2 MG/2ML IJ SOLN
INTRAMUSCULAR | Status: AC
  Filled 2014-11-02: qty 2

## 2014-11-02 MED ORDER — FUROSEMIDE 80 MG PO TABS
80.0000 mg | ORAL_TABLET | Freq: Two times a day (BID) | ORAL | Status: DC
  Administered 2014-11-02 – 2014-11-03 (×3): 80 mg via ORAL
  Filled 2014-11-02 (×6): qty 1

## 2014-11-02 MED ORDER — HYDROMORPHONE HCL 1 MG/ML IJ SOLN: 0.2500 mg | INTRAMUSCULAR | Status: DC | PRN

## 2014-11-02 MED ORDER — OSELTAMIVIR PHOSPHATE 30 MG PO CAPS
30.0000 mg | ORAL_CAPSULE | Freq: Every day | ORAL | Status: DC
  Administered 2014-11-02 – 2014-11-03 (×2): 30 mg via ORAL
  Filled 2014-11-02 (×3): qty 1

## 2014-11-02 MED ORDER — MIDAZOLAM HCL 5 MG/5ML IJ SOLN
INTRAMUSCULAR | Status: DC | PRN
  Administered 2014-11-02 (×2): 1 mg via INTRAVENOUS

## 2014-11-02 MED ORDER — SODIUM CHLORIDE 0.9 % IV SOLN
250.0000 mg | Freq: Once | INTRAVENOUS | Status: AC
  Administered 2014-11-02: 250 mg via INTRAVENOUS
  Filled 2014-11-02: qty 20

## 2014-11-02 MED ORDER — PROPOFOL 10 MG/ML IV BOLUS
INTRAVENOUS | Status: DC | PRN
  Administered 2014-11-02: 20 mg via INTRAVENOUS

## 2014-11-02 MED ORDER — EPHEDRINE SULFATE 50 MG/ML IJ SOLN
INTRAMUSCULAR | Status: AC
  Filled 2014-11-02: qty 1

## 2014-11-02 MED ORDER — AMOXICILLIN-POT CLAVULANATE 500-125 MG PO TABS
1.0000 | ORAL_TABLET | Freq: Two times a day (BID) | ORAL | Status: DC
  Administered 2014-11-02: 500 mg via ORAL
  Filled 2014-11-02: qty 1

## 2014-11-02 MED ORDER — ROCURONIUM BROMIDE 50 MG/5ML IV SOLN
INTRAVENOUS | Status: AC
Start: 2014-11-02 — End: 2014-11-02
  Filled 2014-11-02: qty 1

## 2014-11-02 MED ORDER — LIDOCAINE HCL (CARDIAC) 20 MG/ML IV SOLN
INTRAVENOUS | Status: DC | PRN
  Administered 2014-11-02: 50 mg via INTRAVENOUS

## 2014-11-02 MED ORDER — FENTANYL CITRATE 0.05 MG/ML IJ SOLN
INTRAMUSCULAR | Status: DC | PRN
  Administered 2014-11-02 (×3): 50 ug via INTRAVENOUS

## 2014-11-02 MED ORDER — ONDANSETRON HCL 4 MG/2ML IJ SOLN
INTRAMUSCULAR | Status: AC
  Filled 2014-11-02: qty 2

## 2014-11-02 MED ORDER — FENTANYL CITRATE 0.05 MG/ML IJ SOLN
INTRAMUSCULAR | Status: AC
  Filled 2014-11-02: qty 5

## 2014-11-02 SURGICAL SUPPLY — 24 items
ARMBAND PINK RESTRICT EXTREMIT (MISCELLANEOUS) ×3 IMPLANT
CANISTER SUCTION 2500CC (MISCELLANEOUS) ×3 IMPLANT
CLIP TI MEDIUM 6 (CLIP) ×3 IMPLANT
CLIP TI WIDE RED SMALL 6 (CLIP) ×3 IMPLANT
COVER PROBE W GEL 5X96 (DRAPES) ×3 IMPLANT
DRAIN PENROSE 1/4X12 LTX STRL (WOUND CARE) ×3 IMPLANT
ELECT REM PT RETURN 9FT ADLT (ELECTROSURGICAL) ×3
ELECTRODE REM PT RTRN 9FT ADLT (ELECTROSURGICAL) ×1 IMPLANT
GEL ULTRASOUND 20GR AQUASONIC (MISCELLANEOUS) IMPLANT
GLOVE SS BIOGEL STRL SZ 7 (GLOVE) ×1 IMPLANT
GLOVE SUPERSENSE BIOGEL SZ 7 (GLOVE) ×2
GOWN STRL REUS W/ TWL LRG LVL3 (GOWN DISPOSABLE) ×3 IMPLANT
GOWN STRL REUS W/TWL LRG LVL3 (GOWN DISPOSABLE) ×6
KIT BASIN OR (CUSTOM PROCEDURE TRAY) ×3 IMPLANT
KIT ROOM TURNOVER OR (KITS) ×3 IMPLANT
LIQUID BAND (GAUZE/BANDAGES/DRESSINGS) ×3 IMPLANT
NS IRRIG 1000ML POUR BTL (IV SOLUTION) ×3 IMPLANT
PACK CV ACCESS (CUSTOM PROCEDURE TRAY) ×3 IMPLANT
PAD ARMBOARD 7.5X6 YLW CONV (MISCELLANEOUS) ×6 IMPLANT
SUT PROLENE 6 0 BV (SUTURE) ×3 IMPLANT
SUT VIC AB 3-0 SH 27 (SUTURE) ×2
SUT VIC AB 3-0 SH 27X BRD (SUTURE) ×1 IMPLANT
UNDERPAD 30X30 INCONTINENT (UNDERPADS AND DIAPERS) ×3 IMPLANT
WATER STERILE IRR 1000ML POUR (IV SOLUTION) ×3 IMPLANT

## 2014-11-02 NOTE — Progress Notes (Signed)
PT Cancellation Note  Patient Details Name: Brian Fuller MRN: [id number] DOB: 1951-01-14   Cancelled Treatment:    Reason Eval/Treat Not Completed: Patient at procedure or test/unavailable. Pt in OR this morning for AV fistula placement. Will hold PT eval and check back as schedule allows to complete assessment.   Rolinda Roan 11/02/2014, 9:16 AM   Rolinda Roan, PT, DPT Acute Rehabilitation Services Pager: [phone]

## 2014-11-02 NOTE — Telephone Encounter (Signed)
LM for pt and mailed letter, dpm

## 2014-11-02 NOTE — Progress Notes (Signed)
Subjective: Interval History: has complaints still some cough.  Objective: Vital signs in last 24 hours: Temp:  [97.4 F (36.3 C)-99.1 F (37.3 C)] 97.4 F (36.3 C) (03/16 1110) Pulse Rate:  [66-71] 66 (03/16 1015) Resp:  [17-20] 20 (03/16 1015) BP: (135-171)/(61-72) 135/61 mmHg (03/16 1110) SpO2:  [94 %-99 %] 97 % (03/16 1110) Weight:  [61.7 kg (136 lb 0.4 oz)] 61.7 kg (136 lb 0.4 oz) (03/15 2104) Weight change: 0.374 kg (13.2 oz)  Intake/Output from previous day: 03/15 0701 - 03/16 0700 In: 945 [P.O.:360; I.V.:250; Blood:335] Out: 1900 [Urine:1900] Intake/Output this shift: Total I/O In: 100 [I.V.:100] Out: -   General appearance: alert, cooperative and no distress Resp: rales bibasilar Cardio: S1, S2 normal and systolic murmur: holosystolic 2/6, blowing at apex GI: pos bs, liver down 4 cm Extremities: new AVF LLA  Lab Results:  Recent Labs  11/01/14 0535 11/02/14 0525  WBC 3.2* 3.6*  HGB 6.6* 8.3*  HCT 19.6* 23.7*  PLT 147* 155   BMET:  Recent Labs  11/01/14 0535 11/02/14 0525  NA 135 134*  K 3.5 3.2*  CL 104 101  CO2 21 22  GLUCOSE 103* 193*  BUN 82* 85*  CREATININE 6.72* 6.83*  CALCIUM 7.1* 7.2*    Recent Labs  10/31/14 0808  PTH 66*   Iron Studies:  Recent Labs  10/31/14 0808  IRON 10*  TIBC 160*    Studies/Results: Dg Chest Port 1 View  11/01/2014   CLINICAL DATA:  Dyspnea since yesterday.  EXAM: PORTABLE CHEST - 1 VIEW  COMPARISON:  10/30/2014.  FINDINGS: Stable enlarged cardiac silhouette. The previously demonstrated bilateral airspace opacity is almost completely resolved. No pleural fluid seen. Lower thoracic spine degenerative changes.  IMPRESSION: 1. Significantly improved bilateral alveolar edema. Pneumonia is less likely given the rapid improvement. 2. Stable cardiomegaly.   Electronically Signed   By: Claudie Revering M.D.   On: 11/01/2014 13:08    I have reviewed the patient's current medications.  Assessment/Plan: 1 CKD 5  slowly worse. Vol xs, by exam and CXR, will resume Lasix.  Not sure will get by with out dialysis long. 2 Anemia getting ivFe. And epo 3 HPTH not serious 4 DM controlled 5 HTN  5 Influ on Tamiflu P educate, epo, lasix,     LOS: 3 days   Andrea Colglazier L 11/02/2014,12:14 PM

## 2014-11-02 NOTE — H&P (View-Only) (Signed)
Consult Note  Patient name: Brian Fuller MRN: [id number] DOB: 1951/08/17 Sex: male  Consulting Physician:  Renal  Reason for Consult:  Chief Complaint  Patient presents with  . Shortness of Breath  . Fever    HISTORY OF PRESENT ILLNESS: This is a 64 year old gentleman who was admitted to the hospital with sepsis and multifocal pneumonia and respiratory failure.  He was volume overloaded.  He has a history of renal insufficiency.  He was seen by Dr. early several months ago and scheduled for left-sided fistula but this never occurred.  We are asked to provide permanent access.  He is right-handed.  The patient's renal failure secondary to diabetes.  He is a nonsmoker.  He is Spanish only speaking  Past Medical History  Diagnosis Date  . Diabetes mellitus without complication   . Glaucoma   . Anemia   . Chronic kidney disease     Past Surgical History  Procedure Laterality Date  . Eye surgery      History   Social History  . Marital Status: Married    Spouse Name: N/A  . Number of Children: N/A  . Years of Education: N/A   Occupational History  . Not on file.   Social History Main Topics  . Smoking status: Never Smoker   . Smokeless tobacco: Not on file  . Alcohol Use: No  . Drug Use: Not on file  . Sexual Activity: Not on file   Other Topics Concern  . Not on file   Social History Narrative    Family History  Problem Relation Age of Onset  . Diabetes Mother   . Diabetes Brother   . Diabetes Father   . Hypertension Sister     Allergies as of 10/30/2014  . (No Known Allergies)    No current facility-administered medications on file prior to encounter.   Current Outpatient Prescriptions on File Prior to Encounter  Medication Sig Dispense Refill  . amLODipine (NORVASC) 10 MG tablet Take 1 tablet (10 mg total) by mouth daily. 30 tablet 3  . calcitRIOL (ROCALTROL) 0.25 MCG capsule Take 1 capsule (0.25 mcg total) by mouth daily. 30  capsule 3  . carvedilol (COREG) 6.25 MG tablet Take 1 tablet (6.25 mg total) by mouth 2 (two) times daily with a meal. 60 tablet 3  . furosemide (LASIX) 20 MG tablet Take 20 mg by mouth daily.    . traMADol (ULTRAM) 50 MG tablet Take 1 tablet (50 mg total) by mouth every 8 (eight) hours as needed for moderate pain. (Patient not taking: Reported on 10/30/2014) 30 tablet 0     REVIEW OF SYSTEMS: Cardiovascular: No chest pain, chest pressure, palpitations Pulmonary: Breathing improved Neurologic: No weakness, paresthesias, aphasia, or amaurosis. No dizziness. Hematologic: No bleeding problems or clotting disorders. Musculoskeletal: No joint pain or joint swelling. Gastrointestinal: No blood in stool or hematemesis Genitourinary: No dysuria or hematuria. Psychiatric:: No history of major depression. Integumentary: No rashes or ulcers. Constitutional: No fever or chills.  PHYSICAL EXAMINATION: General: The patient appears their stated age.  Vital signs are BP 171/64 mmHg  Pulse 71  Temp(Src) 99.1 F (37.3 C) (Oral)  Resp 19  Ht 5\' 5"  (1.651 m)  Wt 135 lb 3.2 oz (61.326 kg)  BMI 22.50 kg/m2  SpO2 94% Pulmonary: Respirations are non-labored HEENT:  No gross abnormalities Musculoskeletal: There are no major deformities.   Neurologic: No focal weakness or paresthesias are detected, Skin: There are  no ulcer or rashes noted. Psychiatric: The patient has normal affect. Cardiovascular: There is a regular rate and rhythm without significant murmur appreciated. palpable left radial pulse.  Prominent left cephalic vein visualized throughout the upper arm  Diagnostic Studies:  the patient has vein mapping from our office in November which shows an adequate left cephalic vein    Assessment:  ESRD Plan: I discussed with the patient via a spanish speaking friend.I discussed the risks and benefits of the operation including the need for future surgeries.  He understands the details and the risks.   They're familiar with dialysis as they have had a friend require this in the past.  QUESTIONS were answered.  He will be scheduled for a left radiocephalic, possible brachiocephalic fistula tomorrow morning.  There is an IV in his antecubital crease which needs to be removed.  He will be nothing by mouth midnight      V. Leia Alf, M.D. [facility] Office: [phone] Pager:  [phone]

## 2014-11-02 NOTE — Transfer of Care (Signed)
Immediate Anesthesia Transfer of Care Note  Patient: Brian Fuller  Procedure(s) Performed: Procedure(s): ARTERIOVENOUS (AV) FISTULA CREATION (Left)  Patient Location: PACU  Anesthesia Type:MAC  Level of Consciousness: awake, alert , oriented and patient cooperative  Airway & Oxygen Therapy: Patient Spontanous Breathing  Post-op Assessment: Report given to RN, Post -op Vital signs reviewed and stable and Patient moving all extremities  Post vital signs: Reviewed and stable  Last Vitals:  Filed Vitals:   11/02/14 0500  BP: 151/66  Pulse: 71  Temp: 37.2 C  Resp: 18    Complications: No apparent anesthesia complications

## 2014-11-02 NOTE — Op Note (Signed)
OPERATIVE REPORT  Date of Surgery: 10/30/2014 - 11/02/2014  Surgeon: [name], MD  Assistant: Gerri Lins PA  Pre-op Diagnosis: Stage V Chronic Kidney Disease N18.5  Post-op Diagnosis: Stage V Chronic Kidney Disease N18.5  Procedure: Procedure(s): ARTERIOVENOUS (AV) FISTULA CREATION-left radial-cephalic   Anesthesia: Mac  EBL: Minimal  Complications: None  Procedure Details: The patient was taken the operating room placed in supine position at which time left upper extremity was prepped with Betadine scrub and solution draped in routine sterile manner. The cephalic vein was then imaged using B-mode ultrasound-sono site. It was noted to be excellent quality down to the wrist level was decided to create a radial cephalic fistula. After infiltration with forms and Xylocaine with epinephrine short longitudinal incision was made just proximal to the wrist between the radial artery and cephalic vein. Cephalic vein dissected free. One large lateral branch was ligated with 3-0 silk ties and divided. Vein was ligated distally transected gently dilated with heparinized saline was about 3 mm in size throughout was very superficial in location. Radial artery was exposed and the fascia being careful not to injure the radial nerve. Artery was a 3 mm vessel with an excellent pulse. No heparin was given. Artery was occluded proximally and distally with Vesseloops open 15 blade extended with the Potts scissors and it would easily accept a 3 mm dilator with good inflow. Vein was carefully measured spatulated and anastomosed inside with 6-0 Prolene. Vessel loops were then released there was an excellent pulse and thrill in the fistula readily palpable up to the antecubital area. No heparin or protamine was given. Adequate hemostasis was achieved wound closed in layers with Vicryl subcuticular fashion with Dermabond patient taken to recovery in stable condition   [name], MD 11/02/2014 9:56  AM

## 2014-11-02 NOTE — Anesthesia Preprocedure Evaluation (Signed)
Anesthesia Evaluation  Patient identified by MRN, date of birth, ID band Patient awake    History of Anesthesia Complications Negative for: history of anesthetic complications  Airway Mallampati: II       Dental  (+) Teeth Intact   Pulmonary  Resolved pneumonic processes breath sounds clear to auscultation        Cardiovascular hypertension, Rhythm:Regular Rate:Normal     Neuro/Psych    GI/Hepatic   Endo/Other  diabetes, Poorly Controlled  Renal/GU ESRFRenal disease     Musculoskeletal   Abdominal   Peds  Hematology   Anesthesia Other Findings   Reproductive/Obstetrics                             Anesthesia Physical Anesthesia Plan  ASA: III  Anesthesia Plan: MAC   Post-op Pain Management:    Induction: Intravenous  Airway Management Planned: Natural Airway and Simple Face Mask  Additional Equipment:   Intra-op Plan:   Post-operative Plan:   Informed Consent: I have reviewed the patients History and Physical, chart, labs and discussed the procedure including the risks, benefits and alternatives for the proposed anesthesia with the patient or authorized representative who has indicated his/her understanding and acceptance.     Plan Discussed with:   Anesthesia Plan Comments:         Anesthesia Quick Evaluation

## 2014-11-02 NOTE — Anesthesia Postprocedure Evaluation (Signed)
  Anesthesia Post-op Note  Patient: Brian Fuller  Procedure(s) Performed: Procedure(s): ARTERIOVENOUS (AV) FISTULA CREATION (Left)  Patient Location: PACU  Anesthesia Type:MAC  Level of Consciousness: awake and alert   Airway and Oxygen Therapy: Patient Spontanous Breathing  Post-op Pain: none  Post-op Assessment: Post-op Vital signs reviewed  Post-op Vital Signs: stable  Last Vitals:  Filed Vitals:   11/02/14 1015  BP: 151/68  Pulse: 66  Temp: 37.1 C  Resp: 20    Complications: No apparent anesthesia complications

## 2014-11-02 NOTE — Interval H&P Note (Signed)
History and Physical Interval Note:  11/02/2014 8:34 AM  Brian Fuller  has presented today for surgery, with the diagnosis of Stage V Chronic Kidney Disease N18.5  The various methods of treatment have been discussed with the patient and family. After consideration of risks, benefits and other options for treatment, the patient has consented to  Procedure(s): ARTERIOVENOUS (AV) FISTULA CREATION (Left) as a surgical intervention .  The patient's history has been reviewed, patient examined, no change in status, stable for surgery.  I have reviewed the patient's chart and labs.  Questions were answered to the patient's satisfaction.     [name]

## 2014-11-02 NOTE — Progress Notes (Signed)
[facility] PROGRESS NOTE  Najib Locastro [id number] DOB: 1951-06-27 DOA: 10/30/2014 PCP: PROVIDER NOT IN SYSTEM Interim summary: 64 year old male with h/o CKD, DM, admitted for sob and fever. He was found to have multifocal pneumonia.  Assessment/Plan: 1. Sepsis from multilobar pneumonia: Admitted to stepdown initially requiring BIPAP.  CXR shows much improvement. And we have transitioned to oral antibiotics. Cultures are negative so far. Urine for legionella nd strep pneumoniae are negative.   Diabetes mellitus: hgba1c is 6.2. Well controlled.  CBG (last 3)   Recent Labs  11/02/14 0956 11/02/14 1207 11/02/14 1718  GLUCAP 124* 102* 214*    RESUME SSI.    HYPERTENSION; Better controlled. Resume home meds.    Anemia: Probably anemia of chronic disease from CKD.  S/p 1 unit of prbc transfusion. Repeat hemoglobin is 8.3. Stool for occult blood pending.    CKD:  renal consulted and he underwent AV Fistula placement today.  Plan for HD as per renal. On high dose of lasix. Monitor urine output.    Influenza positive: On tamiflu.     Code Status: fullc ode.  Family Communication: daughter at bedside Disposition Plan: pending.    Consultants:  Renal.   Procedures:  HD  Antibiotics: augmentin. HPI/Subjective: No new complaints.   Objective: Filed Vitals:   11/02/14 1719  BP: 154/61  Pulse: 71  Temp: 97.6 F (36.4 C)  Resp: 18    Intake/Output Summary (Last 24 hours) at 11/02/14 1818 Last data filed at 11/02/14 0940  Gross per 24 hour  Intake    340 ml  Output    700 ml  Net   -360 ml   Filed Weights   10/30/14 0944 10/31/14 2238 11/01/14 2104  Weight: 65.5 kg (144 lb 6.4 oz) 61.326 kg (135 lb 3.2 oz) 61.7 kg (136 lb 0.4 oz)    Exam:   General:  Alert afebrile comfortable  Cardiovascular: s1s2  Respiratory: ctab  Abdomen: soft non tender non distended bowel sounds heard.   Musculoskeletal: no pedal edema.   Data  Reviewed: Basic Metabolic Panel:  Recent Labs Lab 10/30/14 0930 10/30/14 1430 10/31/14 0446 11/01/14 0535 11/02/14 0525  NA 136  --  137 135 134*  K 4.9  --  4.3 3.5 3.2*  CL 110  --  111 104 101  CO2 15*  --  15* 21 22  GLUCOSE 150*  --  75 103* 193*  BUN 71*  --  74* 82* 85*  CREATININE 5.93* 5.98* 6.08* 6.72* 6.83*  CALCIUM 7.9*  --  7.8* 7.1* 7.2*  PHOS  --   --   --  6.8*  --    Liver Function Tests:  Recent Labs Lab 10/30/14 0930 10/31/14 0446 11/01/14 0535  AST 44* 36 35  ALT 36 27 25  ALKPHOS 69 52 52  BILITOT 0.5 0.6 0.5  PROT 6.0 5.0* 4.9*  ALBUMIN 2.5* 2.0* 2.0*   No results for input(s): LIPASE, AMYLASE in the last 168 hours. No results for input(s): AMMONIA in the last 168 hours. CBC:  Recent Labs Lab 10/30/14 0930 10/31/14 0446 11/01/14 0535 11/02/14 0525  WBC 9.7 5.4 3.2* 3.6*  NEUTROABS 9.1*  --   --   --   HGB 7.5* 7.0* 6.6* 8.3*  HCT 23.0* 20.6* 19.6* 23.7*  MCV 94.3 90.0 88.7 87.5  PLT 171 153 147* 155   Cardiac Enzymes: No results for input(s): CKTOTAL, CKMB, CKMBINDEX, TROPONINI in the last 168 hours. BNP (last 3 results) No  results for input(s): BNP in the last 8760 hours.  ProBNP (last 3 results) No results for input(s): PROBNP in the last 8760 hours.  CBG:  Recent Labs Lab 11/01/14 1632 11/01/14 2111 11/02/14 0956 11/02/14 1207 11/02/14 1718  GLUCAP 183* 154* 124* 102* 214*    Recent Results (from the past 240 hour(s))  Blood Culture (routine x 2)     Status: None (Preliminary result)   Collection Time: 10/30/14  9:30 AM  Result Value Ref Range Status   Specimen Description BLOOD RIGHT FOREARM  Final   Special Requests BOTTLES DRAWN AEROBIC AND ANAEROBIC 5CC  Final   Culture   Final           BLOOD CULTURE RECEIVED NO GROWTH TO DATE CULTURE WILL BE HELD FOR 5 DAYS BEFORE ISSUING A FINAL NEGATIVE REPORT Performed at [facility]    Report Status PENDING  Incomplete  Blood Culture (routine x 2)     Status:  None (Preliminary result)   Collection Time: 10/30/14  9:36 AM  Result Value Ref Range Status   Specimen Description BLOOD LEFT ARM  Final   Special Requests BOTTLES DRAWN AEROBIC AND ANAEROBIC 5CC  Final   Culture   Final           BLOOD CULTURE RECEIVED NO GROWTH TO DATE CULTURE WILL BE HELD FOR 5 DAYS BEFORE ISSUING A FINAL NEGATIVE REPORT Performed at [facility]    Report Status PENDING  Incomplete  MRSA PCR Screening     Status: None   Collection Time: 10/30/14  1:05 PM  Result Value Ref Range Status   MRSA by PCR NEGATIVE NEGATIVE Final    Comment:        The GeneXpert MRSA Assay (FDA approved for NASAL specimens only), is one component of a comprehensive MRSA colonization surveillance program. It is not intended to diagnose MRSA infection nor to guide or monitor treatment for MRSA infections.   Urine culture     Status: None   Collection Time: 10/30/14  1:40 PM  Result Value Ref Range Status   Specimen Description URINE, CLEAN CATCH  Final   Special Requests NONE  Final   Colony Count   Final    8,000 COLONIES/ML Performed at [facility]    Culture   Final    INSIGNIFICANT GROWTH Performed at [facility]    Report Status 10/31/2014 FINAL  Final     Studies: Dg Chest Port 1 View  11/01/2014   CLINICAL DATA:  Dyspnea since yesterday.  EXAM: PORTABLE CHEST - 1 VIEW  COMPARISON:  10/30/2014.  FINDINGS: Stable enlarged cardiac silhouette. The previously demonstrated bilateral airspace opacity is almost completely resolved. No pleural fluid seen. Lower thoracic spine degenerative changes.  IMPRESSION: 1. Significantly improved bilateral alveolar edema. Pneumonia is less likely given the rapid improvement. 2. Stable cardiomegaly.   Electronically Signed   By: Claudie Revering M.D.   On: 11/01/2014 13:08    Scheduled Meds: . albuterol  5 mg Nebulization TID  . amoxicillin-clavulanate  1 tablet Oral Q12H  . calcium acetate  667 mg Oral TID WC  .  carvedilol  6.25 mg Oral BID WC  . darbepoetin (ARANESP) injection - NON-DIALYSIS  150 mcg Subcutaneous Q Mon-1800  . docusate sodium  100 mg Oral BID  . [START ON 11/03/2014] ferric gluconate (FERRLECIT/NULECIT) IV  250 mg Intravenous Daily  . furosemide  80 mg Intravenous Once  . furosemide  80 mg Oral BID  .  heparin  5,000 Units Subcutaneous 3 times per day  . insulin aspart  0-9 Units Subcutaneous TID WC  . oseltamivir  30 mg Oral Daily  . sodium bicarbonate  1,300 mg Oral BID  . 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     [facility] Pager [phone]. If 7PM-7AM, please contact night-coverage at [email or web address], password [facility] 11/02/2014, 6:18 PM  LOS: 3 days

## 2014-11-02 NOTE — Progress Notes (Addendum)
ANTIBIOTIC CONSULT NOTE - FOLLOW UP  Pharmacy Consult for Vancomycin and Zosyn Indication: rule out sepsis  No Known Allergies  Patient Measurements: Height: 5\' 5"  (165.1 cm) Weight: 136 lb 0.4 oz (61.7 kg) IBW/kg (Calculated) : 61.5  Vital Signs: Temp: 97.4 F (36.3 C) (03/16 1110) Temp Source: Oral (03/16 0500) BP: 135/61 mmHg (03/16 1110) Pulse Rate: 66 (03/16 1015) Intake/Output from previous day: 03/15 0701 - 03/16 0700 In: 945 [P.O.:360; I.V.:250; Blood:335] Out: 1900 [Urine:1900] Intake/Output from this shift: Total I/O In: 100 [I.V.:100] Out: -   Labs:  Recent Labs  10/31/14 0446 11/01/14 0535 11/02/14 0525  WBC 5.4 3.2* 3.6*  HGB 7.0* 6.6* 8.3*  PLT 153 147* 155  CREATININE 6.08* 6.72* 6.83*   Estimated Creatinine Clearance: 9.8 mL/min (by C-G formula based on Cr of 6.83). No results for input(s): VANCOTROUGH, VANCOPEAK, VANCORANDOM, GENTTROUGH, GENTPEAK, GENTRANDOM, TOBRATROUGH, TOBRAPEAK, TOBRARND, AMIKACINPEAK, AMIKACINTROU, AMIKACIN in the last 72 hours.   Microbiology: Recent Results (from the past 720 hour(s))  Blood Culture (routine x 2)     Status: None (Preliminary result)   Collection Time: 10/30/14  9:30 AM  Result Value Ref Range Status   Specimen Description BLOOD RIGHT FOREARM  Final   Special Requests BOTTLES DRAWN AEROBIC AND ANAEROBIC 5CC  Final   Culture   Final           BLOOD CULTURE RECEIVED NO GROWTH TO DATE CULTURE WILL BE HELD FOR 5 DAYS BEFORE ISSUING A FINAL NEGATIVE REPORT Performed at [facility]    Report Status PENDING  Incomplete  Blood Culture (routine x 2)     Status: None (Preliminary result)   Collection Time: 10/30/14  9:36 AM  Result Value Ref Range Status   Specimen Description BLOOD LEFT ARM  Final   Special Requests BOTTLES DRAWN AEROBIC AND ANAEROBIC 5CC  Final   Culture   Final           BLOOD CULTURE RECEIVED NO GROWTH TO DATE CULTURE WILL BE HELD FOR 5 DAYS BEFORE ISSUING A FINAL NEGATIVE  REPORT Performed at [facility]    Report Status PENDING  Incomplete  MRSA PCR Screening     Status: None   Collection Time: 10/30/14  1:05 PM  Result Value Ref Range Status   MRSA by PCR NEGATIVE NEGATIVE Final    Comment:        The GeneXpert MRSA Assay (FDA approved for NASAL specimens only), is one component of a comprehensive MRSA colonization surveillance program. It is not intended to diagnose MRSA infection nor to guide or monitor treatment for MRSA infections.   Urine culture     Status: None   Collection Time: 10/30/14  1:40 PM  Result Value Ref Range Status   Specimen Description URINE, CLEAN CATCH  Final   Special Requests NONE  Final   Colony Count   Final    8,000 COLONIES/ML Performed at [facility]    Culture   Final    INSIGNIFICANT GROWTH Performed at [facility]    Report Status 10/31/2014 FINAL  Final    Anti-infectives    Start     Dose/Rate Route Frequency Ordered Stop   11/02/14 1212  oseltamivir (TAMIFLU) capsule 30 mg     30 mg Oral Daily 11/02/14 1213 11/04/14 1001   10/31/14 1000  cefTRIAXone (ROCEPHIN) 1 g in dextrose 5 % 50 mL IVPB  Status:  Discontinued     1 g 100 mL/hr over  30 Minutes Intravenous Every 24 hours 10/30/14 0941 10/30/14 1348   10/31/14 1000  azithromycin (ZITHROMAX) 500 mg in dextrose 5 % 250 mL IVPB  Status:  Discontinued     500 mg 250 mL/hr over 60 Minutes Intravenous Every 24 hours 10/30/14 0941 10/30/14 1348   10/31/14 1000  oseltamivir (TAMIFLU) capsule 30 mg  Status:  Discontinued     30 mg Oral Daily 10/31/14 0741 11/02/14 1213   10/30/14 2200  piperacillin-tazobactam (ZOSYN) IVPB 2.25 g     2.25 g 100 mL/hr over 30 Minutes Intravenous 3 times per day 10/30/14 1425     10/30/14 1430  piperacillin-tazobactam (ZOSYN) IVPB 3.375 g  Status:  Discontinued     3.375 g 12.5 mL/hr over 240 Minutes Intravenous  Once 10/30/14 1422 10/30/14 1425   10/30/14 1430  piperacillin-tazobactam (ZOSYN)  IVPB 3.375 g     3.375 g 100 mL/hr over 30 Minutes Intravenous  Once 10/30/14 1425 10/30/14 1604   10/30/14 1415  vancomycin (VANCOCIN) IVPB 1000 mg/200 mL premix     1,000 mg 200 mL/hr over 60 Minutes Intravenous Every 48 hours 10/30/14 1413     10/30/14 1415  piperacillin-tazobactam (ZOSYN) IVPB 3.375 g  Status:  Discontinued     3.375 g 12.5 mL/hr over 240 Minutes Intravenous 3 times per day 10/30/14 1413 10/30/14 1422   10/30/14 0945  cefTRIAXone (ROCEPHIN) 1 g in dextrose 5 % 50 mL IVPB     1 g 100 mL/hr over 30 Minutes Intravenous  Once 10/30/14 0933 10/30/14 1048   10/30/14 0945  azithromycin (ZITHROMAX) 500 mg in dextrose 5 % 250 mL IVPB     500 mg 250 mL/hr over 60 Minutes Intravenous  Once 10/30/14 0933 10/30/14 1116      Assessment: Brian Fuller with CKD stage 5 and recent AVF creation (11/02/14) on vancomycin and Zosyn D#4 for sepsis secondary to bilateral multifocal pneumonia. WBC 3.6, afebrile.  3/13 UCx >> insignificant growth 3/13 BCx2 >> NGTD  Azithro x1 dose 3/13 CTX x1 dose 3/13 Vanc 3/13 >>  Zosyn 3/13 >>   Goal of Therapy:  Vancomycin trough level 15-20 mcg/ml  Plan:  -Continue vancomycin 1 g IV q48h -Continue Zosyn 2.25 g IV q8h -Follow up need for HD -VT at steady state if clinically indicated -Monitor clinical progress and length of therapy  Whitney Muse, PharmD Candidate 11/02/2014,1:39 PM   ----------------------- Addendum: Deniece Ree and Zosyn changed to Augmentin by MD.  Whitney Muse, PharmD Candidate

## 2014-11-02 NOTE — Telephone Encounter (Signed)
-----   Message from Mena Goes, RN sent at 11/02/2014 10:20 AM EDT ----- Regarding: Schedule   ----- Message -----    From: Ulyses Amor, PA-C    Sent: 11/02/2014   9:44 AM      To: Vvs Charge Pool  F/U with Dr. Kellie Simmering in 6 weeks with duplex s/p Left RC fistula

## 2014-11-03 ENCOUNTER — Encounter (HOSPITAL_COMMUNITY): Payer: Self-pay | Admitting: Vascular Surgery

## 2014-11-03 LAB — RENAL FUNCTION PANEL
ANION GAP: 15 (ref 5–15)
Albumin: 2 g/dL — ABNORMAL LOW (ref 3.5–5.2)
BUN: 86 mg/dL — ABNORMAL HIGH (ref 6–23)
CO2: 18 mmol/L — AB (ref 19–32)
Calcium: 7.6 mg/dL — ABNORMAL LOW (ref 8.4–10.5)
Chloride: 105 mmol/L (ref 96–112)
Creatinine, Ser: 7.03 mg/dL — ABNORMAL HIGH (ref 0.50–1.35)
GFR calc non Af Amer: 7 mL/min — ABNORMAL LOW (ref >90)
GFR, EST AFRICAN AMERICAN: 9 mL/min — AB (ref >90)
GLUCOSE: 164 mg/dL — AB (ref 70–99)
Phosphorus: 5.5 mg/dL — ABNORMAL HIGH (ref 2.3–4.6)
Potassium: 3.6 mmol/L (ref 3.5–5.1)
Sodium: 138 mmol/L (ref 135–145)

## 2014-11-03 LAB — CBC
HEMATOCRIT: 25.3 % — AB (ref 39.0–52.0)
HEMOGLOBIN: 8.5 g/dL — AB (ref 13.0–17.0)
MCH: 29.7 pg (ref 26.0–34.0)
MCHC: 33.6 g/dL (ref 30.0–36.0)
MCV: 88.5 fL (ref 78.0–100.0)
Platelets: 178 10*3/uL (ref 150–400)
RBC: 2.86 MIL/uL — AB (ref 4.22–5.81)
RDW: 14.4 % (ref 11.5–15.5)
WBC: 3.8 10*3/uL — ABNORMAL LOW (ref 4.0–10.5)

## 2014-11-03 LAB — GLUCOSE, CAPILLARY
GLUCOSE-CAPILLARY: 164 mg/dL — AB (ref 70–99)
GLUCOSE-CAPILLARY: 161 mg/dL — AB (ref 70–99)
Glucose-Capillary: 131 mg/dL — ABNORMAL HIGH (ref 70–99)
Glucose-Capillary: 244 mg/dL — ABNORMAL HIGH (ref 70–99)

## 2014-11-03 LAB — HEPATITIS B CORE ANTIBODY, IGM: Hep B C IgM: NONREACTIVE

## 2014-11-03 LAB — HEPATITIS B SURFACE ANTIGEN: Hepatitis B Surface Ag: NEGATIVE

## 2014-11-03 MED ORDER — CEFAZOLIN SODIUM 1-5 GM-% IV SOLN: 1.0000 g | INTRAVENOUS | Status: DC

## 2014-11-03 NOTE — Progress Notes (Signed)
Interpreter used to obtain nformed consent for diatek catheter placement.

## 2014-11-03 NOTE — Progress Notes (Signed)
Diatek catheter procedure discussed with pt via interpreter.  Questions answered.  Pt agreeable to sign consent.  Ruta Hinds, MD [facility] Office: [phone] Pager: [phone]

## 2014-11-03 NOTE — Evaluation (Signed)
Physical Therapy Evaluation Patient Details Name: Brian Fuller MRN: [id number] DOB: [birth year] Today's Date: 11/03/2014   History of Present Illness  Pt is a Spanish speaking 64 year old gentleman who was admitted to the hospital with sepsis and multifocal pneumonia and respiratory failure. He was volume overloaded. He has a history of renal insufficiency. On 3/16 pt had a L radial-cephalic AV fistula created.   Clinical Impression  Pt admitted with above diagnosis. Pt currently with functional limitations due to the deficits listed below (see PT Problem List). At the time of PT eval pt was able to perform transfers and ambulation with min guard to min assist for balance. Pt was able to demonstrate dynamic balance activity without LOB, however occasionally required assist to regain balance during regular ambulation and turns. Pt will benefit from skilled PT to increase their independence and safety with mobility to allow discharge to the venue listed below. Anticipate that pt will return to baseline of function with increased OOB activity. Recommending cane at d/c for safety.     Follow Up Recommendations No PT follow up;Supervision - Intermittent    Equipment Recommendations  Cane    Recommendations for Other Services       Precautions / Restrictions Precautions Precautions: Fall Restrictions Weight Bearing Restrictions: No      Mobility  Bed Mobility               General bed mobility comments: Pt was sitting EOB when PT arrived.   Transfers Overall transfer level: Needs assistance Equipment used: None Transfers: Sit to/from Stand Sit to Stand: Min guard         General transfer comment: Pt was able to power-up to full standing with no physical assist. Close guard for safety as pt unsteady.   Ambulation/Gait Ambulation/Gait assistance: Min assist Ambulation Distance (Feet): 250 Feet Assistive device: None Gait Pattern/deviations: Step-through  pattern;Decreased stride length;Staggering left;Staggering right Gait velocity: Decreased Gait velocity interpretation: Below normal speed for age/gender General Gait Details: Pt requires occasional min assist for unsteadiness/LOB, especially with turns. Pt did not report fatigue at end of gait training.   Stairs            Wheelchair Mobility    Modified Rankin (Stroke Patients Only)       Balance Overall balance assessment: Needs assistance Sitting-balance support: Feet supported;No upper extremity supported Sitting balance-Leahy Scale: Good     Standing balance support: No upper extremity supported;During functional activity Standing balance-Leahy Scale: Poor Standing balance comment: Requires UE support or assist from therapist to maintain dynamic standing balance.              High level balance activites: Direction changes;Turns;Sudden stops High Level Balance Comments: Pt also able to perform exaggerated long step-lengths and stepping over objects in hall without LOB.              Pertinent Vitals/Pain Pain Assessment: No/denies pain    Home Living Family/patient expects to be discharged to:: Private residence Living Arrangements: Spouse/significant other Available Help at Discharge: Family;Available 24 hours/day;Friend(s) Type of Home: Mobile home Home Access: Stairs to enter Entrance Stairs-Rails: None Entrance Stairs-Number of Steps: 4 Home Layout: One level Home Equipment: None      Prior Function Level of Independence: Independent               Hand Dominance   Dominant Hand: Right    Extremity/Trunk Assessment   Upper Extremity Assessment: Defer to OT evaluation  Lower Extremity Assessment: Overall WFL for tasks assessed      Cervical / Trunk Assessment: Normal  Communication   Communication: Prefers language other than [place];Interpreter utilized  Cognition Arousal/Alertness: Awake/alert Behavior During  Therapy: WFL for tasks assessed/performed Overall Cognitive Status: Within Functional Limits for tasks assessed                      General Comments      Exercises        Assessment/Plan    PT Assessment Patient needs continued PT services  PT Diagnosis Difficulty walking   PT Problem List Decreased strength;Decreased range of motion;Decreased activity tolerance;Decreased balance;Decreased mobility;Decreased knowledge of use of DME;Decreased safety awareness;Decreased knowledge of precautions  PT Treatment Interventions DME instruction;Stair training;Gait training;Functional mobility training;Therapeutic activities;Therapeutic exercise;Neuromuscular re-education;Patient/family education   PT Goals (Current goals can be found in the Care Plan section) Acute Rehab PT Goals Patient Stated Goal: Return home PT Goal Formulation: With patient/family Time For Goal Achievement: 11/17/14 Potential to Achieve Goals: Good    Frequency Min 3X/week   Barriers to discharge        Co-evaluation               End of Session Equipment Utilized During Treatment: Gait belt Activity Tolerance: Patient tolerated treatment well Patient left: in chair;with call bell/phone within reach;with family/visitor present;with nursing/sitter in room Nurse Communication: Mobility status         Time: [id number] PT Time Calculation (min) (ACUTE ONLY): 23 min   Charges:   PT Evaluation $Initial PT Evaluation Tier I: 1 Procedure PT Treatments $Gait Training: 8-22 mins   PT G Codes:        Rolinda Roan November 21, 2014, 9:18 AM  Rolinda Roan, PT, DPT Acute Rehabilitation Services Pager: [phone]

## 2014-11-03 NOTE — Progress Notes (Signed)
Subjective: Interval History: has no complaint.  Objective: Vital signs in last 24 hours: Temp:  [97.6 F (36.4 C)-98.9 F (37.2 C)] 98.2 F (36.8 C) (03/17 1000) Pulse Rate:  [68-76] 76 (03/17 1000) Resp:  [16-20] 18 (03/17 1000) BP: (141-171)/(61-70) 145/67 mmHg (03/17 1000) SpO2:  [96 %-98 %] 96 % (03/17 1000) Weight:  [61.5 kg (135 lb 9.3 oz)-61.508 kg (135 lb 9.6 oz)] 61.5 kg (135 lb 9.3 oz) (03/17 0500) Weight change: -0.192 kg (-6.8 oz)  Intake/Output from previous day: 03/16 0701 - 03/17 0700 In: 580 [P.O.:480; I.V.:100] Out: 170 [Urine:170] Intake/Output this shift: Total I/O In: 120 [P.O.:120] Out: -   General appearance: alert, cooperative and no distress Resp: rales bibasilar Cardio: S1, S2 normal and systolic murmur: holosystolic 2/6, blowing at apex GI: pos bs, soft, liver down 5 cm Extremities: avf LFA  Lab Results:  Recent Labs  11/02/14 0525 11/03/14 0650  WBC 3.6* 3.8*  HGB 8.3* 8.5*  HCT 23.7* 25.3*  PLT 155 178   BMET:  Recent Labs  11/02/14 0525 11/03/14 0650  NA 134* 138  K 3.2* 3.6  CL 101 105  CO2 22 18*  GLUCOSE 193* 164*  BUN 85* 86*  CREATININE 6.83* 7.03*  CALCIUM 7.2* 7.6*   No results for input(s): PTH in the last 72 hours. Iron Studies: No results for input(s): IRON, TIBC, TRANSFERRIN, FERRITIN in the last 72 hours.  Studies/Results: No results found.  I have reviewed the patient's current medications.  Assessment/Plan: 1 CKD 5 worsening.  Needs to start HD. Will Get PC placed.  Still some vol xs, but not grossly uremic 2 Anemia on epo 3 DM per primary 4 Influ on med 5 HTN  P Get PC, cont epo, HD in am.  clip    LOS: 4 days   Lyna Laningham L 11/03/2014,11:53 AM

## 2014-11-03 NOTE — Progress Notes (Signed)
Per staff ed we do not have hemodialysis videos available in Spanish.  Coletta Memos with [facility] forwarded the link to the [employer]. Patient and family were not interested in viewing. Patient's daughter stated that they are not new to hemodialysis. They have relatives that are currently receiving HD. Dr. Jimmy Footman made aware.

## 2014-11-03 NOTE — Progress Notes (Addendum)
  [facility] Progress Note  11/03/2014 10:24 AM 1 Day Post-Op  Subjective:  Having very little pain in hand.    Filed Vitals:   11/03/14 1000  BP: 145/67  Pulse: 76  Temp: 98.2 F (36.8 C)  Resp: 18    Physical Exam: Incisions:  Left hand incision clean and intact Extremities:  Left hand warm and pink. Palpable thrill in left radial-cephalic AVF. Hand grip 5/5. Sensation in left digits intact.   CBC    Component Value Date/Time   WBC 3.8* 11/03/2014 0650   RBC 2.86* 11/03/2014 0650   RBC 3.03* 02/22/2014 0039   HGB 8.5* 11/03/2014 0650   HCT 25.3* 11/03/2014 0650   PLT 178 11/03/2014 0650   MCV 88.5 11/03/2014 0650   MCH 29.7 11/03/2014 0650   MCHC 33.6 11/03/2014 0650   RDW 14.4 11/03/2014 0650   LYMPHSABS 0.2* 10/30/2014 0930   MONOABS 0.4 10/30/2014 0930   EOSABS 0.0 10/30/2014 0930   BASOSABS 0.0 10/30/2014 0930    BMET    Component Value Date/Time   NA 138 11/03/2014 0650   K 3.6 11/03/2014 0650   CL 105 11/03/2014 0650   CO2 18* 11/03/2014 0650   GLUCOSE 164* 11/03/2014 0650   BUN 86* 11/03/2014 0650   CREATININE 7.03* 11/03/2014 0650   CREATININE 4.79* 02/24/2014 1144   CALCIUM 7.6* 11/03/2014 0650   GFRNONAA 7* 11/03/2014 0650   GFRAA 9* 11/03/2014 0650    INR    Component Value Date/Time   INR 1.04 11/02/2014 0525     Intake/Output Summary (Last 24 hours) at 11/03/14 1024 Last data filed at 11/03/14 0900  Gross per 24 hour  Intake    600 ml  Output    170 ml  Net    430 ml     Assessment:  64 y.o. male is s/p: left radial-cephalic AV fistula creation   1 Day Post-Op  Plan: -Fistula is patent. Patient exhibits no signs or symptoms of steal syndrome. -Follow up in office in 6 weeks to evaluate fistula maturation. -Will sign off for now. Please call with questions.   Virgina Jock, PA-C [facility] Office: [phone] Pager: [phone] 11/03/2014 10:24 AM   Agree with above Will  follow-up in 6 weeks in my office

## 2014-11-03 NOTE — Progress Notes (Addendum)
[facility] PROGRESS NOTE  Brian Fuller [id number] DOB: 14-Apr-1951 DOA: 10/30/2014 PCP: PROVIDER NOT IN SYSTEM Interim summary: 64 year old male with h/o CKD, DM, admitted for sob and fever. He was found to have multifocal pneumonia. He has h/o CKD nearing ESRD and requires HD. AV fistula was placed by vascular surgery on 3/16 and plan for HD with a temporary catheter in am.   Assessment/Plan: 1. Sepsis from multilobar pneumonia: Admitted to stepdown initially requiring BIPAP. Currently he is bbreathing improved and he is off [place] oxygen.  CXR shows much improvement. And we have transitioned to oral antibiotics. Cultures are negative so far. Urine for legionella nd strep pneumoniae are negative.  Plan to continue oral antibiotics to complete the course.   Diabetes mellitus: hgba1c is 6.2. Well controlled.  CBG (last 3)   Recent Labs  11/03/14 0836 11/03/14 1232 11/03/14 1651  GLUCAP 131* 164* 161*    RESUME SSI.    HYPERTENSION; Better controlled. Resume home meds.    Anemia: Probably anemia of chronic disease from CKD.  S/p 1 unit of prbc transfusion. Repeat hemoglobin is 8.5. Low iron levels, epo ordered by nephrology.   Stool for occult blood pending.    CKD:  renal consulted and he underwent AV Fistula placement by vascular surgery on 3/16. Outpatient follow up with vascular for revaluation of maturation. Plan for HD as per renal. On high dose of lasix. Monitor urine output.    Influenza positive: On tamiflu.   Hypokalemia: replete as needed. Repeat levels today show normal potassium.     Code Status: fullc ode.  Family Communication: daughter at bedside Disposition Plan: pendin, home when renal function improves.     Consultants:  Renal.   Procedures:  HD  Antibiotics: augmentin. HPI/Subjective: No new complaints. Denies any sob, chest pain, nausea, or vomiting.   Objective: Filed Vitals:   11/03/14 1000  BP: 145/67  Pulse:  76  Temp: 98.2 F (36.8 C)  Resp: 18    Intake/Output Summary (Last 24 hours) at 11/03/14 1721 Last data filed at 11/03/14 1300  Gross per 24 hour  Intake    720 ml  Output    170 ml  Net    550 ml   Filed Weights   11/01/14 2104 11/02/14 2134 11/03/14 0500  Weight: 61.7 kg (136 lb 0.4 oz) 61.508 kg (135 lb 9.6 oz) 61.5 kg (135 lb 9.3 oz)    Exam:   General:  Alert afebrile comfortable  Cardiovascular: s1s2, RRR  Respiratory: clear to auscultation, no wheezing or rhonchi  Abdomen: soft non tender non distended bowel sounds heard.   Musculoskeletal: no pedal edema.   Data Reviewed: Basic Metabolic Panel:  Recent Labs Lab 10/30/14 0930 10/30/14 1430 10/31/14 0446 11/01/14 0535 11/02/14 0525 11/03/14 0650  NA 136  --  137 135 134* 138  K 4.9  --  4.3 3.5 3.2* 3.6  CL 110  --  111 104 101 105  CO2 15*  --  15* 21 22 18*  GLUCOSE 150*  --  75 103* 193* 164*  BUN 71*  --  74* 82* 85* 86*  CREATININE 5.93* 5.98* 6.08* 6.72* 6.83* 7.03*  CALCIUM 7.9*  --  7.8* 7.1* 7.2* 7.6*  PHOS  --   --   --  6.8*  --  5.5*   Liver Function Tests:  Recent Labs Lab 10/30/14 0930 10/31/14 0446 11/01/14 0535 11/03/14 0650  AST 44* 36 35  --   ALT 36 27  25  --   ALKPHOS 69 52 52  --   BILITOT 0.5 0.6 0.5  --   PROT 6.0 5.0* 4.9*  --   ALBUMIN 2.5* 2.0* 2.0* 2.0*   No results for input(s): LIPASE, AMYLASE in the last 168 hours. No results for input(s): AMMONIA in the last 168 hours. CBC:  Recent Labs Lab 10/30/14 0930 10/31/14 0446 11/01/14 0535 11/02/14 0525 11/03/14 0650  WBC 9.7 5.4 3.2* 3.6* 3.8*  NEUTROABS 9.1*  --   --   --   --   HGB 7.5* 7.0* 6.6* 8.3* 8.5*  HCT 23.0* 20.6* 19.6* 23.7* 25.3*  MCV 94.3 90.0 88.7 87.5 88.5  PLT 171 153 147* 155 178   Cardiac Enzymes: No results for input(s): CKTOTAL, CKMB, CKMBINDEX, TROPONINI in the last 168 hours. BNP (last 3 results) No results for input(s): BNP in the last 8760 hours.  ProBNP (last 3  results) No results for input(s): PROBNP in the last 8760 hours.  CBG:  Recent Labs Lab 11/02/14 1718 11/02/14 2138 11/03/14 0836 11/03/14 1232 11/03/14 1651  GLUCAP 214* 210* 131* 164* 161*    Recent Results (from the past 240 hour(s))  Blood Culture (routine x 2)     Status: None (Preliminary result)   Collection Time: 10/30/14  9:30 AM  Result Value Ref Range Status   Specimen Description BLOOD RIGHT FOREARM  Final   Special Requests BOTTLES DRAWN AEROBIC AND ANAEROBIC 5CC  Final   Culture   Final           BLOOD CULTURE RECEIVED NO GROWTH TO DATE CULTURE WILL BE HELD FOR 5 DAYS BEFORE ISSUING A FINAL NEGATIVE REPORT Performed at [facility]    Report Status PENDING  Incomplete  Blood Culture (routine x 2)     Status: None (Preliminary result)   Collection Time: 10/30/14  9:36 AM  Result Value Ref Range Status   Specimen Description BLOOD LEFT ARM  Final   Special Requests BOTTLES DRAWN AEROBIC AND ANAEROBIC 5CC  Final   Culture   Final           BLOOD CULTURE RECEIVED NO GROWTH TO DATE CULTURE WILL BE HELD FOR 5 DAYS BEFORE ISSUING A FINAL NEGATIVE REPORT Performed at [facility]    Report Status PENDING  Incomplete  MRSA PCR Screening     Status: None   Collection Time: 10/30/14  1:05 PM  Result Value Ref Range Status   MRSA by PCR NEGATIVE NEGATIVE Final    Comment:        The GeneXpert MRSA Assay (FDA approved for NASAL specimens only), is one component of a comprehensive MRSA colonization surveillance program. It is not intended to diagnose MRSA infection nor to guide or monitor treatment for MRSA infections.   Urine culture     Status: None   Collection Time: 10/30/14  1:40 PM  Result Value Ref Range Status   Specimen Description URINE, CLEAN CATCH  Final   Special Requests NONE  Final   Colony Count   Final    8,000 COLONIES/ML Performed at [facility]    Culture   Final    INSIGNIFICANT GROWTH Performed at [facility]    Report Status 10/31/2014 FINAL  Final     Studies: No results found.  Scheduled Meds: . albuterol  5 mg Nebulization TID  . amoxicillin-clavulanate  1 tablet Oral Q24H  . calcium acetate  667 mg Oral TID WC  .  carvedilol  6.25 mg Oral BID WC  . darbepoetin (ARANESP) injection - NON-DIALYSIS  150 mcg Subcutaneous Q Mon-1800  . docusate sodium  100 mg Oral BID  . furosemide  80 mg Intravenous Once  . furosemide  80 mg Oral BID  . heparin  5,000 Units Subcutaneous 3 times per day  . insulin aspart  0-9 Units Subcutaneous TID WC  . oseltamivir  30 mg Oral Daily  . sodium bicarbonate  1,300 mg Oral BID  . 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     [facility] Pager [phone]. If 7PM-7AM, please contact night-coverage at [email or web address], password [facility] 11/03/2014, 5:21 PM  LOS: 4 days

## 2014-11-04 ENCOUNTER — Encounter (HOSPITAL_COMMUNITY)
Admission: EM | Disposition: A | Discharge status: Home / Self Care | Payer: Self-pay | Source: Home / Self Care | Attending: Internal Medicine

## 2014-11-04 ENCOUNTER — Inpatient Hospital Stay (HOSPITAL_COMMUNITY): Payer: Medicaid Other | Admitting: Certified Registered Nurse Anesthetist

## 2014-11-04 ENCOUNTER — Inpatient Hospital Stay (HOSPITAL_COMMUNITY): Payer: Medicaid Other

## 2014-11-04 ENCOUNTER — Encounter (HOSPITAL_COMMUNITY): Payer: Self-pay | Admitting: Certified Registered Nurse Anesthetist

## 2014-11-04 DIAGNOSIS — N186 End stage renal disease: Secondary | ICD-10-CM

## 2014-11-04 HISTORY — PX: INSERTION OF DIALYSIS CATHETER: SHX1324

## 2014-11-04 LAB — CBC
HCT: 24.9 % — ABNORMAL LOW (ref 39.0–52.0)
Hemoglobin: 8.3 g/dL — ABNORMAL LOW (ref 13.0–17.0)
MCH: 30.3 pg (ref 26.0–34.0)
MCHC: 33.3 g/dL (ref 30.0–36.0)
MCV: 90.9 fL (ref 78.0–100.0)
PLATELETS: 209 10*3/uL (ref 150–400)
RBC: 2.74 MIL/uL — ABNORMAL LOW (ref 4.22–5.81)
RDW: 14.3 % (ref 11.5–15.5)
WBC: 4.8 10*3/uL (ref 4.0–10.5)

## 2014-11-04 LAB — GLUCOSE, CAPILLARY
GLUCOSE-CAPILLARY: 152 mg/dL — AB (ref 70–99)
Glucose-Capillary: 113 mg/dL — ABNORMAL HIGH (ref 70–99)
Glucose-Capillary: 106 mg/dL — ABNORMAL HIGH (ref 70–99)
Glucose-Capillary: 131 mg/dL — ABNORMAL HIGH (ref 70–99)

## 2014-11-04 LAB — HEPATITIS B SURFACE ANTIBODY,QUALITATIVE: Hep B S Ab: NONREACTIVE

## 2014-11-04 LAB — RENAL FUNCTION PANEL
ALBUMIN: 2.1 g/dL — AB (ref 3.5–5.2)
ANION GAP: 13 (ref 5–15)
BUN: 87 mg/dL — AB (ref 6–23)
CHLORIDE: 106 mmol/L (ref 96–112)
CO2: 22 mmol/L (ref 19–32)
Calcium: 8.2 mg/dL — ABNORMAL LOW (ref 8.4–10.5)
Creatinine, Ser: 7.29 mg/dL — ABNORMAL HIGH (ref 0.50–1.35)
GFR calc Af Amer: 8 mL/min — ABNORMAL LOW (ref >90)
GFR calc non Af Amer: 7 mL/min — ABNORMAL LOW (ref >90)
Glucose, Bld: 120 mg/dL — ABNORMAL HIGH (ref 70–99)
Phosphorus: 4.9 mg/dL — ABNORMAL HIGH (ref 2.3–4.6)
Potassium: 3.7 mmol/L (ref 3.5–5.1)
Sodium: 141 mmol/L (ref 135–145)

## 2014-11-04 SURGERY — INSERTION OF DIALYSIS CATHETER
Anesthesia: Monitor Anesthesia Care | Site: Neck | Laterality: Right

## 2014-11-04 MED ORDER — IOHEXOL 300 MG/ML  SOLN: INTRAMUSCULAR | Status: DC | PRN

## 2014-11-04 MED ORDER — HEPARIN SODIUM (PORCINE) 1000 UNIT/ML DIALYSIS
1000.0000 [IU] | INTRAMUSCULAR | Status: DC | PRN
  Filled 2014-11-04: qty 1

## 2014-11-04 MED ORDER — CEFAZOLIN SODIUM-DEXTROSE 2-3 GM-% IV SOLR
INTRAVENOUS | Status: AC
  Filled 2014-11-04: qty 50

## 2014-11-04 MED ORDER — FENTANYL CITRATE 0.05 MG/ML IJ SOLN
INTRAMUSCULAR | Status: AC
  Filled 2014-11-04: qty 5

## 2014-11-04 MED ORDER — HEPARIN SODIUM (PORCINE) 1000 UNIT/ML IJ SOLN
INTRAMUSCULAR | Status: AC
Start: 2014-11-04 — End: 2014-11-04
  Filled 2014-11-04: qty 1

## 2014-11-04 MED ORDER — NEPRO/CARBSTEADY PO LIQD: 237.0000 mL | ORAL | Status: DC | PRN

## 2014-11-04 MED ORDER — MIDAZOLAM HCL 2 MG/2ML IJ SOLN
INTRAMUSCULAR | Status: AC
  Filled 2014-11-04: qty 2

## 2014-11-04 MED ORDER — MIDAZOLAM HCL 5 MG/5ML IJ SOLN
INTRAMUSCULAR | Status: DC | PRN
  Administered 2014-11-04: 2 mg via INTRAVENOUS

## 2014-11-04 MED ORDER — LIDOCAINE-PRILOCAINE 2.5-2.5 % EX CREA: 1.0000 "application " | TOPICAL_CREAM | CUTANEOUS | Status: DC | PRN

## 2014-11-04 MED ORDER — LIDOCAINE HCL (PF) 1 % IJ SOLN: 5.0000 mL | INTRAMUSCULAR | Status: DC | PRN

## 2014-11-04 MED ORDER — CEFAZOLIN SODIUM-DEXTROSE 2-3 GM-% IV SOLR
INTRAVENOUS | Status: DC | PRN
  Administered 2014-11-04: 2 g via INTRAVENOUS

## 2014-11-04 MED ORDER — HEPARIN SODIUM (PORCINE) 1000 UNIT/ML IJ SOLN
INTRAMUSCULAR | Status: DC | PRN
  Administered 2014-11-04: 4.2 [IU]

## 2014-11-04 MED ORDER — ALTEPLASE 2 MG IJ SOLR
2.0000 mg | Freq: Once | INTRAMUSCULAR | Status: AC | PRN
  Filled 2014-11-04: qty 2

## 2014-11-04 MED ORDER — 0.9 % SODIUM CHLORIDE (POUR BTL) OPTIME
TOPICAL | Status: DC | PRN
  Administered 2014-11-04: 1000 mL

## 2014-11-04 MED ORDER — LIDOCAINE HCL (PF) 1 % IJ SOLN
INTRAMUSCULAR | Status: AC
  Filled 2014-11-04: qty 30

## 2014-11-04 MED ORDER — LIDOCAINE HCL (PF) 1 % IJ SOLN
INTRAMUSCULAR | Status: DC | PRN
  Administered 2014-11-04: 13 mL

## 2014-11-04 MED ORDER — FENTANYL CITRATE 0.05 MG/ML IJ SOLN
INTRAMUSCULAR | Status: DC | PRN
  Administered 2014-11-04: 50 ug via INTRAVENOUS

## 2014-11-04 MED ORDER — ALBUTEROL SULFATE (2.5 MG/3ML) 0.083% IN NEBU: 2.5000 mg | INHALATION_SOLUTION | Freq: Four times a day (QID) | RESPIRATORY_TRACT | Status: DC | PRN

## 2014-11-04 MED ORDER — RENA-VITE PO TABS
1.0000 | ORAL_TABLET | Freq: Every day | ORAL | Status: DC
Start: 2014-11-04 — End: 2014-11-10
  Administered 2014-11-04 – 2014-11-09 (×6): 1 via ORAL
  Filled 2014-11-04 (×8): qty 1

## 2014-11-04 MED ORDER — PENTAFLUOROPROP-TETRAFLUOROETH EX AERO: 1.0000 "application " | INHALATION_SPRAY | CUTANEOUS | Status: DC | PRN

## 2014-11-04 MED ORDER — SODIUM CHLORIDE 0.9 % IR SOLN
Status: DC | PRN
  Administered 2014-11-04: 500 mL

## 2014-11-04 MED ORDER — SODIUM CHLORIDE 0.9 % IV SOLN: 100.0000 mL | INTRAVENOUS | Status: DC | PRN

## 2014-11-04 MED ORDER — HEPARIN SODIUM (PORCINE) 1000 UNIT/ML DIALYSIS
40.0000 [IU]/kg | Freq: Once | INTRAMUSCULAR | Status: AC
  Administered 2014-11-04: 2300 [IU] via INTRAVENOUS_CENTRAL
  Filled 2014-11-04: qty 3

## 2014-11-04 MED ORDER — SODIUM CHLORIDE 0.9 % IV SOLN
INTRAVENOUS | Status: DC | PRN
  Administered 2014-11-04: 09:00:00 via INTRAVENOUS

## 2014-11-04 MED ORDER — PROPOFOL INFUSION 10 MG/ML OPTIME
INTRAVENOUS | Status: DC | PRN
  Administered 2014-11-04: 75 ug/kg/min via INTRAVENOUS

## 2014-11-04 MED ORDER — ONDANSETRON HCL 4 MG/2ML IJ SOLN
INTRAMUSCULAR | Status: AC
  Filled 2014-11-04: qty 6

## 2014-11-04 MED ORDER — ALBUTEROL SULFATE (2.5 MG/3ML) 0.083% IN NEBU: 5.0000 mg | INHALATION_SOLUTION | Freq: Four times a day (QID) | RESPIRATORY_TRACT | Status: DC | PRN

## 2014-11-04 MED ORDER — ONDANSETRON HCL 4 MG/2ML IJ SOLN
INTRAMUSCULAR | Status: DC | PRN
  Administered 2014-11-04: 4 mg via INTRAVENOUS

## 2014-11-04 SURGICAL SUPPLY — 37 items
BAG DECANTER FOR FLEXI CONT (MISCELLANEOUS) ×3 IMPLANT
BIOPATCH RED 1 DISK 7.0 (GAUZE/BANDAGES/DRESSINGS) ×2 IMPLANT
BIOPATCH RED 1IN DISK 7.0MM (GAUZE/BANDAGES/DRESSINGS) ×1
CATH CANNON HEMO 15F 50CM (CATHETERS) IMPLANT
CATH CANNON HEMO 15FR 19 (HEMODIALYSIS SUPPLIES) ×3 IMPLANT
CATH CANNON HEMO 15FR 23CM (HEMODIALYSIS SUPPLIES) IMPLANT
CATH CANNON HEMO 15FR 31CM (HEMODIALYSIS SUPPLIES) IMPLANT
CATH CANNON HEMO 15FR 32CM (HEMODIALYSIS SUPPLIES) IMPLANT
COVER PROBE W GEL 5X96 (DRAPES) IMPLANT
DECANTER SPIKE VIAL GLASS SM (MISCELLANEOUS) ×3 IMPLANT
DRAPE C-ARM 42X72 X-RAY (DRAPES) ×3 IMPLANT
DRAPE CHEST BREAST 15X10 FENES (DRAPES) ×3 IMPLANT
GAUZE SPONGE 2X2 8PLY STRL LF (GAUZE/BANDAGES/DRESSINGS) ×1 IMPLANT
GAUZE SPONGE 4X4 16PLY XRAY LF (GAUZE/BANDAGES/DRESSINGS) ×3 IMPLANT
GLOVE SS BIOGEL STRL SZ 7 (GLOVE) ×1 IMPLANT
GLOVE SUPERSENSE BIOGEL SZ 7 (GLOVE) ×2
GLOVE SURG SS PI 6.0 STRL IVOR (GLOVE) ×3 IMPLANT
GOWN STRL REUS W/ TWL LRG LVL3 (GOWN DISPOSABLE) ×2 IMPLANT
GOWN STRL REUS W/TWL LRG LVL3 (GOWN DISPOSABLE) ×4
KIT BASIN OR (CUSTOM PROCEDURE TRAY) ×3 IMPLANT
KIT ROOM TURNOVER OR (KITS) ×3 IMPLANT
LIQUID BAND (GAUZE/BANDAGES/DRESSINGS) ×3 IMPLANT
NEEDLE 18GX1X1/2 (RX/OR ONLY) (NEEDLE) ×3 IMPLANT
NEEDLE 22X1 1/2 (OR ONLY) (NEEDLE) IMPLANT
NEEDLE HYPO 25GX1X1/2 BEV (NEEDLE) ×3 IMPLANT
NS IRRIG 1000ML POUR BTL (IV SOLUTION) ×3 IMPLANT
PACK SURGICAL SETUP 50X90 (CUSTOM PROCEDURE TRAY) ×3 IMPLANT
PAD ARMBOARD 7.5X6 YLW CONV (MISCELLANEOUS) ×6 IMPLANT
SOAP 2 % CHG 4 OZ (WOUND CARE) ×3 IMPLANT
SPONGE GAUZE 2X2 STER 10/PKG (GAUZE/BANDAGES/DRESSINGS) ×2
SUT ETHILON 3 0 PS 1 (SUTURE) ×3 IMPLANT
SUT VICRYL 4-0 PS2 18IN ABS (SUTURE) ×3 IMPLANT
SYR 20CC LL (SYRINGE) ×3 IMPLANT
SYR 5ML LL (SYRINGE) ×6 IMPLANT
SYR CONTROL 10ML LL (SYRINGE) ×3 IMPLANT
SYRINGE 10CC LL (SYRINGE) ×3 IMPLANT
WATER STERILE IRR 1000ML POUR (IV SOLUTION) ×3 IMPLANT

## 2014-11-04 NOTE — Progress Notes (Signed)
Subjective: Interval History: has no complaint .  Objective: Vital signs in last 24 hours: Temp:  [97.7 F (36.5 C)-98.3 F (36.8 C)] 98.2 F (36.8 C) (03/18 1214) Pulse Rate:  [68-76] 70 (03/18 1214) Resp:  [13-21] 18 (03/18 1214) BP: (133-172)/(57-70) 146/70 mmHg (03/18 1214) SpO2:  [96 %-98 %] 96 % (03/18 1214) Weight:  [58.151 kg (128 lb 3.2 oz)] 58.151 kg (128 lb 3.2 oz) (03/17 2138) Weight change: -3.357 kg (-7 lb 6.4 oz)  Intake/Output from previous day: 03/17 0701 - 03/18 0700 In: 360 [P.O.:360] Out: 200 [Urine:200] Intake/Output this shift: Total I/O In: 50 [I.V.:50] Out: 20 [Blood:20]  General appearance: alert, cooperative and no distress Resp: rales bibasilar Chest wall: RIJ cath Cardio: S1, S2 normal and systolic murmur: holosystolic 2/6, blowing at apex GI: pos bs,liver down 4 cm Extremities: edema Tr and AVF LFA  Lab Results:  Recent Labs  11/03/14 0650 11/04/14 0536  WBC 3.8* 4.8  HGB 8.5* 8.3*  HCT 25.3* 24.9*  PLT 178 209   BMET:  Recent Labs  11/03/14 0650 11/04/14 0536  NA 138 141  K 3.6 3.7  CL 105 106  CO2 18* 22  GLUCOSE 164* 120*  BUN 86* 87*  CREATININE 7.03* 7.29*  CALCIUM 7.6* 8.2*   No results for input(s): PTH in the last 72 hours. Iron Studies: No results for input(s): IRON, TIBC, TRANSFERRIN, FERRITIN in the last 72 hours.  Studies/Results: Dg Chest Port 1 View  11/04/2014   CLINICAL DATA:  Post dialysis catheter placement  EXAM: PORTABLE CHEST - 1 VIEW  COMPARISON:  11/01/2014  FINDINGS: Right dialysis catheter is in place with the tip in the upper right atrium. No pneumothorax. There are low lung volumes with cardiomegaly and vascular congestion. No overt edema. No effusions. No acute bony abnormality.  IMPRESSION: Right dialysis catheter tip in the upper right atrium without pneumothorax. Low lung volumes with cardiomegaly and vascular congestion.   Electronically Signed   By: Rolm Baptise M.D.   On: 11/04/2014 11:17    Dg Fluoro Guide Cv Line-no Report  11/04/2014   CLINICAL DATA:    FLOURO GUIDE CV LINE  Fluoroscopy was utilized by the requesting physician.  No radiographic  interpretation.     I have reviewed the patient's current medications.  Assessment/Plan: 1 CKD 5 steadily worsening. Will start HD today. Stop diuretics and bicarb 2 HTN lower vol slowly 3 Influ  4 DM controlled 5 Anemia stable P HD, CLIP,  epo    LOS: 5 days   Jaymian Bogart L 11/04/2014,12:15 PM

## 2014-11-04 NOTE — Progress Notes (Signed)
Physical Therapy Treatment Patient Details Name: Brian Fuller MRN: [id number] DOB: 02/29/1951 Today's Date: 11/04/2014    History of Present Illness Pt is a Spanish speaking 64 year old gentleman who was admitted to the hospital with sepsis and multifocal pneumonia and respiratory failure. He was volume overloaded. He has a history of renal insufficiency. On 3/16 pt had a L radial-cephalic AV fistula created.     PT Comments    Pt ambulating with MIN/guard in hallway and S in room.  Issued illustrated handout on standing balance/strengthening HEP in Spanish.  Pt reports feeling a little unsteady, but stronger.  Follow Up Recommendations  No PT follow up;Supervision - Intermittent     Equipment Recommendations  Cane    Recommendations for Other Services       Precautions / Restrictions Precautions Precautions: Fall Restrictions Weight Bearing Restrictions: No    Mobility  Bed Mobility               General bed mobility comments: Pt up upon arrival.  Transfers     Transfers: Sit to/from Stand Sit to Stand: Supervision            Ambulation/Gait Ambulation/Gait assistance: Min guard Ambulation Distance (Feet): 350 Feet Assistive device: None Gait Pattern/deviations: Step-through pattern;Staggering left;Staggering right     General Gait Details: Min/guard with gait but some general unsteadiness noted with occasional stagger.   Stairs Stairs: Yes Stairs assistance: Min guard Stair Management: One rail Left;Alternating pattern;Step to pattern Number of Stairs: 6 General stair comments: Ascending step through pattern and descending step to pattern  Wheelchair Mobility    Modified Rankin (Stroke Patients Only)       Balance Overall balance assessment: Needs assistance   Sitting balance-Leahy Scale: Good       Standing balance-Leahy Scale: Fair                      Cognition Arousal/Alertness: Awake/alert Behavior  During Therapy: WFL for tasks assessed/performed Overall Cognitive Status: Within Functional Limits for tasks assessed                      Exercises General Exercises - Lower Extremity Hip ABduction/ADduction: Both;10 reps;Standing Hip Flexion/Marching: Both;Standing;10 reps Heel Raises: Strengthening;Both;Standing;10 reps Mini-Sqauts: Strengthening;Both;10 reps;Standing Other Exercises Other Exercises: ham curls and hip ext x 10 B in standing    General Comments        Pertinent Vitals/Pain Pain Assessment: No/denies pain    Home Living                      Prior Function            PT Goals (current goals can now be found in the care plan section) Acute Rehab PT Goals Patient Stated Goal: Return home PT Goal Formulation: With patient/family Time For Goal Achievement: 11/17/14 Potential to Achieve Goals: Good Progress towards PT goals: Progressing toward goals    Frequency  Min 3X/week    PT Plan Current plan remains appropriate    Co-evaluation             End of Session Equipment Utilized During Treatment: Gait belt Activity Tolerance: Patient tolerated treatment well Patient left: in bed;with call bell/phone within reach;with family/visitor present;with nursing/sitter in room     Time: 1349-1414 PT Time Calculation (min) (ACUTE ONLY): 25 min  Charges:  $Gait Training: 8-22 mins $Therapeutic Exercise: 8-22 mins  G Codes:      Orvel Cutsforth LUBECK 11/04/2014, 2:45 PM

## 2014-11-04 NOTE — Anesthesia Preprocedure Evaluation (Addendum)
Anesthesia Evaluation  Patient identified by MRN, date of birth, ID band Patient awake    Reviewed: Allergy & Precautions, NPO status , Patient's Chart, lab work & pertinent test results  Airway Mallampati: II   Neck ROM: full    Dental   Pulmonary          Cardiovascular hypertension,     Neuro/Psych    GI/Hepatic   Endo/Other  diabetes, Type 2  Renal/GU ESRF and DialysisRenal disease     Musculoskeletal   Abdominal   Peds  Hematology   Anesthesia Other Findings   Reproductive/Obstetrics                            Anesthesia Physical Anesthesia Plan  ASA: III  Anesthesia Plan: MAC   Post-op Pain Management:    Induction: Intravenous  Airway Management Planned: Simple Face Mask  Additional Equipment:   Intra-op Plan:   Post-operative Plan:   Informed Consent: I have reviewed the patients History and Physical, chart, labs and discussed the procedure including the risks, benefits and alternatives for the proposed anesthesia with the patient or authorized representative who has indicated his/her understanding and acceptance.     Plan Discussed with: CRNA, Anesthesiologist and Surgeon  Anesthesia Plan Comments:        Anesthesia Quick Evaluation

## 2014-11-04 NOTE — Procedures (Signed)
I was present at this session.  I have reviewed the session itself and made appropriate changes.  HD via PC.  1st HD  Brian Fuller 3/18/20162:42 PM

## 2014-11-04 NOTE — Interval H&P Note (Signed)
History and Physical Interval Note:  11/04/2014 9:02 AM  Brian Fuller  has presented today for surgery, with the diagnosis of End Stage Renal Disease N18.6  The various methods of treatment have been discussed with the patient and family. After consideration of risks, benefits and other options for treatment, the patient has consented to  Procedure(s): INSERTION OF DIALYSIS CATHETER (N/A) as a surgical intervention .  The patient's history has been reviewed, patient examined, no change in status, stable for surgery.  I have reviewed the patient's chart and labs.  Questions were answered to the patient's satisfaction.     [name]

## 2014-11-04 NOTE — Transfer of Care (Signed)
Immediate Anesthesia Transfer of Care Note  Patient: Brian Fuller  Procedure(s) Performed: Procedure(s): INSERTION OF DIALYSIS CATHETER RIGHT INTERNAL JUGULAR VEIN (Right)  Patient Location: PACU  Anesthesia Type:MAC  Level of Consciousness: awake, alert , oriented and patient cooperative  Airway & Oxygen Therapy: Patient Spontanous Breathing  Post-op Assessment: Report given to RN, Post -op Vital signs reviewed and stable and Patient moving all extremities  Post vital signs: Reviewed and stable  Last Vitals:  Filed Vitals:   11/04/14 0428  BP: 155/69  Pulse: 76  Temp: 36.8 C  Resp: 20    Complications: No apparent anesthesia complications

## 2014-11-04 NOTE — H&P (View-Only) (Signed)
Diatek catheter procedure discussed with pt via interpreter.  Questions answered.  Pt agreeable to sign consent.  Ruta Hinds, MD [facility] Office: [phone] Pager: [phone]

## 2014-11-04 NOTE — Progress Notes (Signed)
[facility] PROGRESS NOTE  Terrio Turin [id number] DOB: 05/26/1951 DOA: 10/30/2014 PCP: PROVIDER NOT IN SYSTEM Interim summary: 64 year old male with h/o CKD, DM, admitted for sob and fever. He was found to have multifocal pneumonia. He has h/o CKD nearing ESRD and requires HD. AV fistula was placed by vascular surgery on 3/16  And a dialysis catheter was put in on 3/18 and he underwent his first HD today.   Assessment/Plan: 1. Sepsis from multilobar pneumonia: Admitted to stepdown initially requiring BIPAP. Currently he is breathing improved and he is off [place] oxygen.  CXR on 3/18  shows  Resolution of the pneumonia and he is on augmentin to complete the course. Cultures are negative so far. Urine for legionella nd strep pneumoniae are negative.    Diabetes mellitus: hgba1c is 6.2. Well controlled.  CBG (last 3)   Recent Labs  11/03/14 2133 11/04/14 0804 11/04/14 1010  GLUCAP 244* 113* 106*    RESUME SSI. No changes in SSI.    HYPERTENSION; Better controlled. Resume home meds.    Anemia: Probably anemia of chronic disease from CKD.  S/p 1 unit of prbc transfusion. Repeat hemoglobin is 8.3. Low iron levels, epo ordered by nephrology.   Stool for occult blood pending.    CKD:  renal consulted and he underwent AV Fistula placement by vascular surgery on 3/16. Outpatient follow up with vascular for revaluation of maturation. He underwent dialysis catheter placement on 3/18 and first HD today. Lasix and bicarbonate were stopped by renal. Plan to check renal parameters in am.    Influenza positive: Completed the course of tamiflu.   Hypokalemia: replaced.   PT/OT EVAL ORDERED and recommended no further PT needed at this time.   Hypoalbuminemia: Nutrition consulted.     Code Status: full code.  Family Communication: none at bedside Disposition Plan: pending, home when renal function improves.     Consultants:  Renal.   Vascular surgery    Procedures:  HD  Antibiotics: augmentin. HPI/Subjective: Denies any sob, chest pain, nausea, or vomiting.  Reports feeling tired today  Objective: Filed Vitals:   11/04/14 1700  BP: 106/60  Pulse: 68  Temp:   Resp:     Intake/Output Summary (Last 24 hours) at 11/04/14 1731 Last data filed at 11/04/14 1006  Gross per 24 hour  Intake     50 ml  Output    220 ml  Net   -170 ml   Filed Weights   11/03/14 0500 11/03/14 2138 11/04/14 1425  Weight: 61.5 kg (135 lb 9.3 oz) 58.151 kg (128 lb 3.2 oz) 55.2 kg (121 lb 11.1 oz)    Exam:   General:  Alert afebrile comfortable  Cardiovascular: s1s2, RRR  Respiratory: clear to auscultation, no wheezing or rhonchi  Abdomen: soft non tender non distended bowel sounds heard.   Musculoskeletal: no pedal edema.   Data Reviewed: Basic Metabolic Panel:  Recent Labs Lab 10/31/14 0446 11/01/14 0535 11/02/14 0525 11/03/14 0650 11/04/14 0536  NA 137 135 134* 138 141  K 4.3 3.5 3.2* 3.6 3.7  CL 111 104 101 105 106  CO2 15* 21 22 18* 22  GLUCOSE 75 103* 193* 164* 120*  BUN 74* 82* 85* 86* 87*  CREATININE 6.08* 6.72* 6.83* 7.03* 7.29*  CALCIUM 7.8* 7.1* 7.2* 7.6* 8.2*  PHOS  --  6.8*  --  5.5* 4.9*   Liver Function Tests:  Recent Labs Lab 10/30/14 0930 10/31/14 0446 11/01/14 0535 11/03/14 0650 11/04/14 0536  AST  44* 36 35  --   --   ALT 36 27 25  --   --   ALKPHOS 69 52 52  --   --   BILITOT 0.5 0.6 0.5  --   --   PROT 6.0 5.0* 4.9*  --   --   ALBUMIN 2.5* 2.0* 2.0* 2.0* 2.1*   No results for input(s): LIPASE, AMYLASE in the last 168 hours. No results for input(s): AMMONIA in the last 168 hours. CBC:  Recent Labs Lab 10/30/14 0930 10/31/14 0446 11/01/14 0535 11/02/14 0525 11/03/14 0650 11/04/14 0536  WBC 9.7 5.4 3.2* 3.6* 3.8* 4.8  NEUTROABS 9.1*  --   --   --   --   --   HGB 7.5* 7.0* 6.6* 8.3* 8.5* 8.3*  HCT 23.0* 20.6* 19.6* 23.7* 25.3* 24.9*  MCV 94.3 90.0 88.7 87.5 88.5 90.9  PLT 171 153 147*  155 178 209   Cardiac Enzymes: No results for input(s): CKTOTAL, CKMB, CKMBINDEX, TROPONINI in the last 168 hours. BNP (last 3 results) No results for input(s): BNP in the last 8760 hours.  ProBNP (last 3 results) No results for input(s): PROBNP in the last 8760 hours.  CBG:  Recent Labs Lab 11/03/14 1232 11/03/14 1651 11/03/14 2133 11/04/14 0804 11/04/14 1010  GLUCAP 164* 161* 244* 113* 106*    Recent Results (from the past 240 hour(s))  Blood Culture (routine x 2)     Status: None (Preliminary result)   Collection Time: 10/30/14  9:30 AM  Result Value Ref Range Status   Specimen Description BLOOD RIGHT FOREARM  Final   Special Requests BOTTLES DRAWN AEROBIC AND ANAEROBIC 5CC  Final   Culture   Final           BLOOD CULTURE RECEIVED NO GROWTH TO DATE CULTURE WILL BE HELD FOR 5 DAYS BEFORE ISSUING A FINAL NEGATIVE REPORT Performed at [facility]    Report Status PENDING  Incomplete  Blood Culture (routine x 2)     Status: None (Preliminary result)   Collection Time: 10/30/14  9:36 AM  Result Value Ref Range Status   Specimen Description BLOOD LEFT ARM  Final   Special Requests BOTTLES DRAWN AEROBIC AND ANAEROBIC 5CC  Final   Culture   Final           BLOOD CULTURE RECEIVED NO GROWTH TO DATE CULTURE WILL BE HELD FOR 5 DAYS BEFORE ISSUING A FINAL NEGATIVE REPORT Performed at [facility]    Report Status PENDING  Incomplete  MRSA PCR Screening     Status: None   Collection Time: 10/30/14  1:05 PM  Result Value Ref Range Status   MRSA by PCR NEGATIVE NEGATIVE Final    Comment:        The GeneXpert MRSA Assay (FDA approved for NASAL specimens only), is one component of a comprehensive MRSA colonization surveillance program. It is not intended to diagnose MRSA infection nor to guide or monitor treatment for MRSA infections.   Urine culture     Status: None   Collection Time: 10/30/14  1:40 PM  Result Value Ref Range Status   Specimen  Description URINE, CLEAN CATCH  Final   Special Requests NONE  Final   Colony Count   Final    8,000 COLONIES/ML Performed at [facility]    Culture   Final    INSIGNIFICANT GROWTH Performed at [facility]    Report Status 10/31/2014 FINAL  Final  Studies: Dg Chest Port 1 View  11/04/2014   CLINICAL DATA:  Post dialysis catheter placement  EXAM: PORTABLE CHEST - 1 VIEW  COMPARISON:  11/01/2014  FINDINGS: Right dialysis catheter is in place with the tip in the upper right atrium. No pneumothorax. There are low lung volumes with cardiomegaly and vascular congestion. No overt edema. No effusions. No acute bony abnormality.  IMPRESSION: Right dialysis catheter tip in the upper right atrium without pneumothorax. Low lung volumes with cardiomegaly and vascular congestion.   Electronically Signed   By: Rolm Baptise M.D.   On: 11/04/2014 11:17   Dg Fluoro Guide Cv Line-no Report  11/04/2014   CLINICAL DATA:    FLOURO GUIDE CV LINE  Fluoroscopy was utilized by the requesting physician.  No radiographic  interpretation.     Scheduled Meds: . albuterol  5 mg Nebulization TID  . amoxicillin-clavulanate  1 tablet Oral Q24H  . calcium acetate  667 mg Oral TID WC  . darbepoetin (ARANESP) injection - NON-DIALYSIS  150 mcg Subcutaneous Q Mon-1800  . docusate sodium  100 mg Oral BID  . furosemide  80 mg Intravenous Once  . heparin  40 Units/kg Dialysis Once in dialysis  . heparin  5,000 Units Subcutaneous 3 times per day  . insulin aspart  0-9 Units Subcutaneous TID WC  . multivitamin  1 tablet Oral QHS  . 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     [facility] Pager [phone]. If 7PM-7AM, please contact night-coverage at [email or web address], password [facility] 11/04/2014, 5:31 PM  LOS: 5 days

## 2014-11-04 NOTE — Op Note (Signed)
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 Both internal jugular veins were imaged using the mode ultrasound-sono site. Both were noted to be widely patent. After prepping and draping in routine sterile manner with the patient in the Trendelenburg position the right IJ was entered using a supraclavicular approach and were passed into the right atrium under fluoroscopic guidance. After dilating the track appropriately a 19 cm tunneled hemodialysis catheter was passed repeatedly sheath positioned in the right atrium tunneled peripherally and secured with nylon sutures. Wound was closed with Vicryl in a subcuticular fashion sterile dressing applied patient taken to the recovery room for a chest x-ray   [name], MD 11/04/2014 9:47 AM

## 2014-11-04 NOTE — Progress Notes (Signed)
PT NOTE:  Pt off of the floor for insertion of dialysis catheter.  Will check back as schedule permits. Santiago Glad L. Tamala Julian, [place] Pager [phone] 11/04/2014

## 2014-11-04 NOTE — Patient Instructions (Signed)
Mini-Squats (Standing)   De pie con apoyo. Flexione las rodillas levemente. Tensione el piso plvico. Sostenga durante ___ segundos. Regrese a la posicin de pie. Relaje durante ___ segundos. Repita ___ veces. Realice ___ veces al da.  Copyright  VHI. All rights reserved.  Strengthening: Knee Flexion (Standing)   De pie con apoyo, doble la rodilla derecha lo ms posible. Repita ____ veces por rutina. Realice ____ rutinas por sesin. Realice ____ sesiones por da.  http://orth.exer.us/628   Copyright  VHI. All rights reserved.  Hip Extension (Standing)   De pie con apoyo. Contraiga el piso plvico y Pension scheme manager. Eleve la pierna derecha hacia atrs con la rodilla extendida. Sostenga durante ___ segundos. Relaje durante ___ segundos. Repita ___ veces. Realice ___ veces al da. Repita con la otra pierna. Utilice una pesa de ___ libras.   Copyright  VHI. All rights reserved.  Hip Abduction (Standing)   De pie con apoyo. Contraiga el piso plvico y Pension scheme manager. Lleve la pierna derecha hacia el lado, manteniendo la punta del pie hacia adelante. Sostenga durante ___ segundos. Relaje durante ___ segundos. Repita ___ veces. Realice ___ veces al da. Repita con la otra pierna. Utilice una pesa de ___ libras.    Copyright  VHI. All rights reserved.  Ankle Plantar Flexion / Dorsiflexion, Standing      [place] puntas de los pies. Repita ____ veces por rutina. Realice ____ rutinas por sesin. Realice ____ sesiones por da.  http://orth.exer.us/38   Copyright  VHI. All rights reserved.   Copyright  VHI. All rights reserved.  Knee High   Sostngase de un objeto firme, levante una rodilla a la altura de la cadera, luego bjela. Repita con la otra rodilla. Repita la serie ____ veces. Haga ____ sesiones por da.  http://gt2.exer.us/767   Copyright  VHI. All rights reserved.

## 2014-11-05 LAB — BASIC METABOLIC PANEL
Anion gap: 9 (ref 5–15)
BUN: 47 mg/dL — ABNORMAL HIGH (ref 6–23)
CALCIUM: 8 mg/dL — AB (ref 8.4–10.5)
CHLORIDE: 101 mmol/L (ref 96–112)
CO2: 26 mmol/L (ref 19–32)
Creatinine, Ser: 5.61 mg/dL — ABNORMAL HIGH (ref 0.50–1.35)
GFR calc Af Amer: 11 mL/min — ABNORMAL LOW (ref >90)
GFR, EST NON AFRICAN AMERICAN: 10 mL/min — AB (ref >90)
GLUCOSE: 90 mg/dL (ref 70–99)
Potassium: 4 mmol/L (ref 3.5–5.1)
Sodium: 136 mmol/L (ref 135–145)

## 2014-11-05 LAB — GLUCOSE, CAPILLARY
Glucose-Capillary: 82 mg/dL (ref 70–99)
Glucose-Capillary: 121 mg/dL — ABNORMAL HIGH (ref 70–99)
Glucose-Capillary: 156 mg/dL — ABNORMAL HIGH (ref 70–99)
Glucose-Capillary: 121 mg/dL — ABNORMAL HIGH (ref 70–99)

## 2014-11-05 LAB — CULTURE, BLOOD (ROUTINE X 2)
CULTURE: NO GROWTH
Culture: NO GROWTH

## 2014-11-05 LAB — CBC
HCT: 26.1 % — ABNORMAL LOW (ref 39.0–52.0)
HEMOGLOBIN: 8.5 g/dL — AB (ref 13.0–17.0)
MCH: 29.5 pg (ref 26.0–34.0)
MCHC: 32.6 g/dL (ref 30.0–36.0)
MCV: 90.6 fL (ref 78.0–100.0)
PLATELETS: 254 10*3/uL (ref 150–400)
RBC: 2.88 MIL/uL — ABNORMAL LOW (ref 4.22–5.81)
RDW: 14.3 % (ref 11.5–15.5)
WBC: 4.6 10*3/uL (ref 4.0–10.5)

## 2014-11-05 MED ORDER — HYDRALAZINE HCL 20 MG/ML IJ SOLN: 5.0000 mg | Freq: Four times a day (QID) | INTRAMUSCULAR | Status: DC | PRN

## 2014-11-05 MED ORDER — HYDRALAZINE HCL 25 MG PO TABS
25.0000 mg | ORAL_TABLET | Freq: Three times a day (TID) | ORAL | Status: DC
  Administered 2014-11-05 – 2014-11-06 (×2): 25 mg via ORAL
  Filled 2014-11-05 (×5): qty 1

## 2014-11-05 NOTE — Progress Notes (Signed)
Subjective: Interval History: has no complaint .  Objective: Vital signs in last 24 hours: Temp:  [97.8 F (36.6 C)-98.7 F (37.1 C)] 98.4 F (36.9 C) (03/19 0521) Pulse Rate:  [68-82] 71 (03/19 0521) Resp:  [13-21] 18 (03/19 0521) BP: (84-165)/(50-76) 165/67 mmHg (03/19 0521) SpO2:  [96 %-100 %] 100 % (03/19 0521) Weight:  [53.9 kg (118 lb 13.3 oz)-55.2 kg (121 lb 11.1 oz)] 53.9 kg (118 lb 13.3 oz) (03/18 2111) Weight change: -2.951 kg (-6 lb 8.1 oz)  Intake/Output from previous day: 03/18 0701 - 03/19 0700 In: 410 [P.O.:360; I.V.:50] Out: 553 [Blood:20] Intake/Output this shift:    General appearance: alert, cooperative and no distress Resp: rales bibasilar Chest wall: RIJ PC Cardio: S1, S2 normal and systolic murmur: holosystolic 2/6, blowing at apex GI: soft, non-tender; bowel sounds normal; no masses,  no organomegaly Extremities: New AVF LLA B&T  Lab Results:  Recent Labs  11/04/14 0536 11/05/14 0430  WBC 4.8 4.6  HGB 8.3* 8.5*  HCT 24.9* 26.1*  PLT 209 254   BMET:  Recent Labs  11/04/14 0536 11/05/14 0430  NA 141 136  K 3.7 4.0  CL 106 101  CO2 22 26  GLUCOSE 120* 90  BUN 87* 47*  CREATININE 7.29* 5.61*  CALCIUM 8.2* 8.0*   No results for input(s): PTH in the last 72 hours. Iron Studies: No results for input(s): IRON, TIBC, TRANSFERRIN, FERRITIN in the last 72 hours.  Studies/Results: Dg Chest Port 1 View  11/04/2014   CLINICAL DATA:  Post dialysis catheter placement  EXAM: PORTABLE CHEST - 1 VIEW  COMPARISON:  11/01/2014  FINDINGS: Right dialysis catheter is in place with the tip in the upper right atrium. No pneumothorax. There are low lung volumes with cardiomegaly and vascular congestion. No overt edema. No effusions. No acute bony abnormality.  IMPRESSION: Right dialysis catheter tip in the upper right atrium without pneumothorax. Low lung volumes with cardiomegaly and vascular congestion.   Electronically Signed   By: Rolm Baptise M.D.   On:  11/04/2014 11:17   Dg Fluoro Guide Cv Line-no Report  11/04/2014   CLINICAL DATA:    FLOURO GUIDE CV LINE  Fluoroscopy was utilized by the requesting physician.  No radiographic  interpretation.     I have reviewed the patient's current medications.  Assessment/Plan: 1 ESRD will do 2nd HD on Mon.  Vol and solute better . Will work on outpatient slot 2 anemia on epo 3 DM controlled 4 Influ resolved P HD on Mon, CLIP, DM control    LOS: 6 days   Areej Tayler L 11/05/2014,10:02 AM

## 2014-11-05 NOTE — Anesthesia Postprocedure Evaluation (Signed)
Anesthesia Post Note  Patient: Brian Fuller  Procedure(s) Performed: Procedure(s) (LRB): INSERTION OF DIALYSIS CATHETER RIGHT INTERNAL JUGULAR VEIN (Right)  Anesthesia type: MAC  Patient location: PACU  Post pain: Pain level controlled and Adequate analgesia  Post assessment: Post-op Vital signs reviewed, Patient's Cardiovascular Status Stable and Respiratory Function Stable  Last Vitals:  Filed Vitals:   11/05/14 0521  BP: 165/67  Pulse: 71  Temp: 36.9 C  Resp: 18    Post vital signs: Reviewed and stable  Level of consciousness: awake, alert  and oriented  Complications: No apparent anesthesia complications

## 2014-11-05 NOTE — Progress Notes (Signed)
[facility] PROGRESS NOTE  Brian Fuller [id number] DOB: 1951-06-29 DOA: 10/30/2014 PCP: PROVIDER NOT IN SYSTEM Interim summary: 64 year old male with h/o CKD, DM, admitted for sob and fever. He was found to have multifocal pneumonia. He has h/o CKD nearing ESRD and requires HD. AV fistula was placed by vascular surgery on 3/16  And a dialysis catheter was put in on 3/18 and he is getting HD the last two days. .   Assessment/Plan: 1. Sepsis from multilobar pneumonia: Admitted to stepdown initially requiring BIPAP. Currently he is breathing improved and he is off [place] oxygen.  CXR on 3/18  shows  Resolution of the pneumonia and he is on augmentin to complete the course. Cultures are negative so far. Urine for legionella nd strep pneumoniae are negative.  D/c antibiotics after todays dose.    Diabetes mellitus: hgba1c is 6.2. Well controlled.  CBG (last 3)   Recent Labs  11/04/14 2109 11/05/14 0920 11/05/14 1222  GLUCAP 152* 82 121*    RESUME SSI. No changes in SSI.    HYPERTENSION; Better controlled. Resume home meds.    Anemia: Probably anemia of chronic disease from CKD.  S/p 1 unit of prbc transfusion. Repeat hemoglobin is 8.3. Low iron levels, epo ordered by nephrology.   Stool for occult blood pending.    CKD:  renal consulted and he underwent AV Fistula placement by vascular surgery on 3/16. Outpatient follow up with vascular for revaluation of maturation. He underwent dialysis catheter placement on 3/18 an getting HD everyday.. Lasix and bicarbonate were stopped by renal.  Creatinine improved to 5.   Influenza positive: Completed the course of tamiflu.   Hypokalemia: replaced.   PT/OT EVAL ORDERED and recommended no further PT needed at this time.   Hypoalbuminemia: Nutrition consulted.     Code Status: full code.  Family Communication: none at bedside Disposition Plan: pending, home when renal function improves.      Consultants:  Renal.   Vascular surgery   Procedures:  HD  Antibiotics: augmentin. HPI/Subjective: Denies any sob, chest pain, nausea, or vomiting.  Reports feeling tired today  Objective: Filed Vitals:   11/05/14 1516  BP: 177/74  Pulse: 78  Temp: 98.4 F (36.9 C)  Resp: 18    Intake/Output Summary (Last 24 hours) at 11/05/14 1833 Last data filed at 11/05/14 1516  Gross per 24 hour  Intake    840 ml  Output      0 ml  Net    840 ml   Filed Weights   11/04/14 1425 11/04/14 1738 11/04/14 2111  Weight: 55.2 kg (121 lb 11.1 oz) 54.3 kg (119 lb 11.4 oz) 53.9 kg (118 lb 13.3 oz)    Exam:   General:  Alert afebrile comfortable  Cardiovascular: s1s2, RRR  Respiratory: clear to auscultation, no wheezing or rhonchi  Abdomen: soft non tender non distended bowel sounds heard.   Musculoskeletal: no pedal edema.   Data Reviewed: Basic Metabolic Panel:  Recent Labs Lab 11/01/14 0535 11/02/14 0525 11/03/14 0650 11/04/14 0536 11/05/14 0430  NA 135 134* 138 141 136  K 3.5 3.2* 3.6 3.7 4.0  CL 104 101 105 106 101  CO2 21 22 18* 22 26  GLUCOSE 103* 193* 164* 120* 90  BUN 82* 85* 86* 87* 47*  CREATININE 6.72* 6.83* 7.03* 7.29* 5.61*  CALCIUM 7.1* 7.2* 7.6* 8.2* 8.0*  PHOS 6.8*  --  5.5* 4.9*  --    Liver Function Tests:  Recent Labs Lab 10/30/14  0930 10/31/14 0446 11/01/14 0535 11/03/14 0650 11/04/14 0536  AST 44* 36 35  --   --   ALT 36 27 25  --   --   ALKPHOS 69 52 52  --   --   BILITOT 0.5 0.6 0.5  --   --   PROT 6.0 5.0* 4.9*  --   --   ALBUMIN 2.5* 2.0* 2.0* 2.0* 2.1*   No results for input(s): LIPASE, AMYLASE in the last 168 hours. No results for input(s): AMMONIA in the last 168 hours. CBC:  Recent Labs Lab 10/30/14 0930  11/01/14 0535 11/02/14 0525 11/03/14 0650 11/04/14 0536 11/05/14 0430  WBC 9.7  < > 3.2* 3.6* 3.8* 4.8 4.6  NEUTROABS 9.1*  --   --   --   --   --   --   HGB 7.5*  < > 6.6* 8.3* 8.5* 8.3* 8.5*  HCT 23.0*   < > 19.6* 23.7* 25.3* 24.9* 26.1*  MCV 94.3  < > 88.7 87.5 88.5 90.9 90.6  PLT 171  < > 147* 155 178 209 254  < > = values in this interval not displayed. Cardiac Enzymes: No results for input(s): CKTOTAL, CKMB, CKMBINDEX, TROPONINI in the last 168 hours. BNP (last 3 results) No results for input(s): BNP in the last 8760 hours.  ProBNP (last 3 results) No results for input(s): PROBNP in the last 8760 hours.  CBG:  Recent Labs Lab 11/04/14 1010 11/04/14 1848 11/04/14 2109 11/05/14 0920 11/05/14 1222  GLUCAP 106* 131* 152* 82 121*    Recent Results (from the past 240 hour(s))  Blood Culture (routine x 2)     Status: None   Collection Time: 10/30/14  9:30 AM  Result Value Ref Range Status   Specimen Description BLOOD RIGHT FOREARM  Final   Special Requests BOTTLES DRAWN AEROBIC AND ANAEROBIC 5CC  Final   Culture   Final    NO GROWTH 5 DAYS Performed at [facility]    Report Status 11/05/2014 FINAL  Final  Blood Culture (routine x 2)     Status: None   Collection Time: 10/30/14  9:36 AM  Result Value Ref Range Status   Specimen Description BLOOD LEFT ARM  Final   Special Requests BOTTLES DRAWN AEROBIC AND ANAEROBIC 5CC  Final   Culture   Final    NO GROWTH 5 DAYS Performed at [facility]    Report Status 11/05/2014 FINAL  Final  MRSA PCR Screening     Status: None   Collection Time: 10/30/14  1:05 PM  Result Value Ref Range Status   MRSA by PCR NEGATIVE NEGATIVE Final    Comment:        The GeneXpert MRSA Assay (FDA approved for NASAL specimens only), is one component of a comprehensive MRSA colonization surveillance program. It is not intended to diagnose MRSA infection nor to guide or monitor treatment for MRSA infections.   Urine culture     Status: None   Collection Time: 10/30/14  1:40 PM  Result Value Ref Range Status   Specimen Description URINE, CLEAN CATCH  Final   Special Requests NONE  Final   Colony Count   Final    8,000  COLONIES/ML Performed at [facility]    Culture   Final    INSIGNIFICANT GROWTH Performed at [facility]    Report Status 10/31/2014 FINAL  Final     Studies: Dg Chest Port 1 View  11/04/2014  CLINICAL DATA:  Post dialysis catheter placement  EXAM: PORTABLE CHEST - 1 VIEW  COMPARISON:  11/01/2014  FINDINGS: Right dialysis catheter is in place with the tip in the upper right atrium. No pneumothorax. There are low lung volumes with cardiomegaly and vascular congestion. No overt edema. No effusions. No acute bony abnormality.  IMPRESSION: Right dialysis catheter tip in the upper right atrium without pneumothorax. Low lung volumes with cardiomegaly and vascular congestion.   Electronically Signed   By: Rolm Baptise M.D.   On: 11/04/2014 11:17   Dg Fluoro Guide Cv Line-no Report  11/04/2014   CLINICAL DATA:    FLOURO GUIDE CV LINE  Fluoroscopy was utilized by the requesting physician.  No radiographic  interpretation.     Scheduled Meds: . amoxicillin-clavulanate  1 tablet Oral Q24H  . calcium acetate  667 mg Oral TID WC  . darbepoetin (ARANESP) injection - NON-DIALYSIS  150 mcg Subcutaneous Q Mon-1800  . docusate sodium  100 mg Oral BID  . heparin  5,000 Units Subcutaneous 3 times per day  . insulin aspart  0-9 Units Subcutaneous TID WC  . multivitamin  1 tablet Oral QHS  . 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     [place] [facility] Pager [phone]. If 7PM-7AM,  please contact night-coverage at [email or web address], password [facility] 11/05/2014, 6:33 PM  LOS: 6 days

## 2014-11-06 LAB — BASIC METABOLIC PANEL
Anion gap: 11 (ref 5–15)
BUN: 55 mg/dL — ABNORMAL HIGH (ref 6–23)
CHLORIDE: 103 mmol/L (ref 96–112)
CO2: 25 mmol/L (ref 19–32)
CREATININE: 5.86 mg/dL — AB (ref 0.50–1.35)
Calcium: 8.5 mg/dL (ref 8.4–10.5)
GFR calc Af Amer: 11 mL/min — ABNORMAL LOW (ref >90)
GFR, EST NON AFRICAN AMERICAN: 9 mL/min — AB (ref >90)
GLUCOSE: 189 mg/dL — AB (ref 70–99)
POTASSIUM: 3.6 mmol/L (ref 3.5–5.1)
SODIUM: 139 mmol/L (ref 135–145)

## 2014-11-06 LAB — GLUCOSE, CAPILLARY
GLUCOSE-CAPILLARY: 81 mg/dL (ref 70–99)
Glucose-Capillary: 160 mg/dL — ABNORMAL HIGH (ref 70–99)
Glucose-Capillary: 129 mg/dL — ABNORMAL HIGH (ref 70–99)

## 2014-11-06 MED ORDER — SENNOSIDES-DOCUSATE SODIUM 8.6-50 MG PO TABS
2.0000 | ORAL_TABLET | Freq: Two times a day (BID) | ORAL | Status: DC
  Administered 2014-11-06 – 2014-11-10 (×8): 2 via ORAL
  Filled 2014-11-06 (×10): qty 2

## 2014-11-06 MED ORDER — POLYETHYLENE GLYCOL 3350 17 G PO PACK
17.0000 g | PACK | Freq: Every day | ORAL | Status: DC
  Administered 2014-11-07 – 2014-11-10 (×4): 17 g via ORAL
  Filled 2014-11-06 (×5): qty 1

## 2014-11-06 NOTE — Progress Notes (Signed)
Use  of telephonic interpreter, Elita Quick, for medication administration. Pt educated to remember to check his left arm fistula daily for the pulse and manually shown how to. Pt also expresses concern that he has not been able to speak to Dr. Posey Pronto.  Educated patient that all of the Renal doctors work together. Pt would feel much better and reassured if he could confirm his question with Dr. Posey Pronto, even by telephone. Will report off to morning nurse. Dorthey Sawyer, RN

## 2014-11-06 NOTE — Progress Notes (Signed)
[facility] PROGRESS NOTE  Brian Fuller [id number] DOB: 23-Dec-1950 DOA: 10/30/2014 PCP: PROVIDER NOT IN SYSTEM Interim summary: 64 year old male with h/o CKD, DM, admitted for sob and fever. He was found to have multifocal pneumonia. He has h/o CKD nearing ESRD and requires HD. AV fistula was placed by vascular surgery on 3/16  And a dialysis catheter was put in on 3/18 and he is getting HD the last two days. .   Assessment/Plan: 1. Sepsis from multilobar pneumonia: Admitted to stepdown initially requiring BIPAP. Currently he is breathing improved and he is off [place] oxygen.  CXR on 3/18  shows  Resolution of the pneumonia  He also has completed the course of augmentin for the pneumonia. Cultures are negative so far. Urine for legionella nd strep pneumoniae are negative.    Diabetes mellitus: hgba1c is 6.2. Well controlled.  CBG (last 3)   Recent Labs  11/05/14 2234 11/06/14 0736 11/06/14 1206  GLUCAP 121* 81 160*    RESUME SSI. No changes in SSI.    HYPERTENSION; Sub optimal yesterday and we started the patient on hydralazine. His bp is much better today.   Anemia: Probably anemia of chronic disease from CKD.  S/p 1 unit of prbc transfusion. Repeat hemoglobin is around 8.  Low iron levels, epo ordered by nephrology.   Stool for occult blood pending. He did not have a BM since 3 days.    CKD:  renal consulted and he underwent AV Fistula placement by vascular surgery on 3/16. Outpatient follow up with vascular for revaluation of maturation. He underwent dialysis catheter placement on 3/18 an getting HD everyday.. Lasix and bicarbonate were stopped by renal.  Creatinine is still hovering around 5. Repeat renal parameters in am and plan for another HD tomorrow.  Outpatient HD to be set up .    Influenza positive: Completed the course of tamiflu.   Hypokalemia: replaced.   PT/OT EVAL ORDERED and recommended no further PT needed at this time.    Hypoalbuminemia: Nutrition consulted.   Mild abdominal discomfort: Probably from constipation. Senna colace, and miralax ordered. abd x ray ordered to rule out obstruction.   Code Status: full code.  Family Communication: none at bedside Disposition Plan: pending, home when renal function improves.     Consultants:  Renal.   Vascular surgery   Procedures:  HD  Antibiotics: augmentin. HPI/Subjective: Denies any sob, chest pain, nausea, or vomiting.  Reports abdominal discomfort. Ordered stool softners and laxatives.   Objective: Filed Vitals:   11/06/14 1008  BP: 138/58  Pulse: 76  Temp: 98 F (36.7 C)  Resp: 16   No intake or output data in the 24 hours ending 11/06/14 1528 Filed Weights   11/04/14 1738 11/04/14 2111 11/05/14 2100  Weight: 54.3 kg (119 lb 11.4 oz) 53.9 kg (118 lb 13.3 oz) 53.2 kg (117 lb 4.6 oz)    Exam:   General:  Alert afebrile comfortable  Cardiovascular: s1s2, RRR  Respiratory: clear to auscultation, no wheezing or rhonchi  Abdomen: soft non tender non distended bowel sounds heard.   Musculoskeletal: no pedal edema.   Data Reviewed: Basic Metabolic Panel:  Recent Labs Lab 11/01/14 0535 11/02/14 0525 11/03/14 0650 11/04/14 0536 11/05/14 0430 11/06/14 0948  NA 135 134* 138 141 136 139  K 3.5 3.2* 3.6 3.7 4.0 3.6  CL 104 101 105 106 101 103  CO2 21 22 18* 22 26 25   GLUCOSE 103* 193* 164* 120* 90 189*  BUN 82* 85*  86* 87* 47* 55*  CREATININE 6.72* 6.83* 7.03* 7.29* 5.61* 5.86*  CALCIUM 7.1* 7.2* 7.6* 8.2* 8.0* 8.5  PHOS 6.8*  --  5.5* 4.9*  --   --    Liver Function Tests:  Recent Labs Lab 10/31/14 0446 11/01/14 0535 11/03/14 0650 11/04/14 0536  AST 36 35  --   --   ALT 27 25  --   --   ALKPHOS 52 52  --   --   BILITOT 0.6 0.5  --   --   PROT 5.0* 4.9*  --   --   ALBUMIN 2.0* 2.0* 2.0* 2.1*   No results for input(s): LIPASE, AMYLASE in the last 168 hours. No results for input(s): AMMONIA in the last 168  hours. CBC:  Recent Labs Lab 11/01/14 0535 11/02/14 0525 11/03/14 0650 11/04/14 0536 11/05/14 0430  WBC 3.2* 3.6* 3.8* 4.8 4.6  HGB 6.6* 8.3* 8.5* 8.3* 8.5*  HCT 19.6* 23.7* 25.3* 24.9* 26.1*  MCV 88.7 87.5 88.5 90.9 90.6  PLT 147* 155 178 209 254   Cardiac Enzymes: No results for input(s): CKTOTAL, CKMB, CKMBINDEX, TROPONINI in the last 168 hours. BNP (last 3 results) No results for input(s): BNP in the last 8760 hours.  ProBNP (last 3 results) No results for input(s): PROBNP in the last 8760 hours.  CBG:  Recent Labs Lab 11/05/14 1222 11/05/14 1825 11/05/14 2234 11/06/14 0736 11/06/14 1206  GLUCAP 121* 156* 121* 81 160*    Recent Results (from the past 240 hour(s))  Blood Culture (routine x 2)     Status: None   Collection Time: 10/30/14  9:30 AM  Result Value Ref Range Status   Specimen Description BLOOD RIGHT FOREARM  Final   Special Requests BOTTLES DRAWN AEROBIC AND ANAEROBIC 5CC  Final   Culture   Final    NO GROWTH 5 DAYS Performed at [facility]    Report Status 11/05/2014 FINAL  Final  Blood Culture (routine x 2)     Status: None   Collection Time: 10/30/14  9:36 AM  Result Value Ref Range Status   Specimen Description BLOOD LEFT ARM  Final   Special Requests BOTTLES DRAWN AEROBIC AND ANAEROBIC 5CC  Final   Culture   Final    NO GROWTH 5 DAYS Performed at [facility]    Report Status 11/05/2014 FINAL  Final  MRSA PCR Screening     Status: None   Collection Time: 10/30/14  1:05 PM  Result Value Ref Range Status   MRSA by PCR NEGATIVE NEGATIVE Final    Comment:        The GeneXpert MRSA Assay (FDA approved for NASAL specimens only), is one component of a comprehensive MRSA colonization surveillance program. It is not intended to diagnose MRSA infection nor to guide or monitor treatment for MRSA infections.   Urine culture     Status: None   Collection Time: 10/30/14  1:40 PM  Result Value Ref Range Status   Specimen  Description URINE, CLEAN CATCH  Final   Special Requests NONE  Final   Colony Count   Final    8,000 COLONIES/ML Performed at [facility]    Culture   Final    INSIGNIFICANT GROWTH Performed at [facility]    Report Status 10/31/2014 FINAL  Final     Studies: No results found.  Scheduled Meds: . amoxicillin-clavulanate  1 tablet Oral Q24H  . calcium acetate  667 mg Oral TID WC  .  darbepoetin (ARANESP) injection - NON-DIALYSIS  150 mcg Subcutaneous Q Mon-1800  . docusate sodium  100 mg Oral BID  . heparin  5,000 Units Subcutaneous 3 times per day  . insulin aspart  0-9 Units Subcutaneous TID WC  . multivitamin  1 tablet Oral QHS  . 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     [place] [facility] Pager [phone]. If 7PM-7AM, please contact night-coverage at [email or web address], password [facility] 11/06/2014, 3:28 PM  LOS: 7 days

## 2014-11-06 NOTE — Progress Notes (Signed)
Subjective: Interval History: has complaints stom was upset on HD.  Objective: Vital signs in last 24 hours: Temp:  [98 F (36.7 C)-98.5 F (36.9 C)] 98 F (36.7 C) (03/20 0500) Pulse Rate:  [75-78] 75 (03/20 0500) Resp:  [16-18] 16 (03/20 0500) BP: (168-177)/(72-77) 170/72 mmHg (03/20 0500) SpO2:  [99 %] 99 % (03/20 0500) Weight:  [53.2 kg (117 lb 4.6 oz)] 53.2 kg (117 lb 4.6 oz) (03/19 2100) Weight change: -2 kg (-4 lb 6.5 oz)  Intake/Output from previous day: 03/19 0701 - 03/20 0700 In: 480 [P.O.:480] Out: 0  Intake/Output this shift:    General appearance: alert, cooperative and no distress Neck: RIJ cath Resp: rales bibasilar and rhonchi bibasilar Cardio: S1, S2 normal and systolic murmur: holosystolic 2/6, blowing at apex GI: soft, pos bs, liver down 3 cm Extremities: AVF LLA  Lab Results:  Recent Labs  11/04/14 0536 11/05/14 0430  WBC 4.8 4.6  HGB 8.3* 8.5*  HCT 24.9* 26.1*  PLT 209 254   BMET:  Recent Labs  11/04/14 0536 11/05/14 0430  NA 141 136  K 3.7 4.0  CL 106 101  CO2 22 26  GLUCOSE 120* 90  BUN 87* 47*  CREATININE 7.29* 5.61*  CALCIUM 8.2* 8.0*   No results for input(s): PTH in the last 72 hours. Iron Studies: No results for input(s): IRON, TIBC, TRANSFERRIN, FERRITIN in the last 72 hours.  Studies/Results: Dg Chest Port 1 View  11/04/2014   CLINICAL DATA:  Post dialysis catheter placement  EXAM: PORTABLE CHEST - 1 VIEW  COMPARISON:  11/01/2014  FINDINGS: Right dialysis catheter is in place with the tip in the upper right atrium. No pneumothorax. There are low lung volumes with cardiomegaly and vascular congestion. No overt edema. No effusions. No acute bony abnormality.  IMPRESSION: Right dialysis catheter tip in the upper right atrium without pneumothorax. Low lung volumes with cardiomegaly and vascular congestion.   Electronically Signed   By: Rolm Baptise M.D.   On: 11/04/2014 11:17   Dg Fluoro Guide Cv Line-no Report  11/04/2014    CLINICAL DATA:    FLOURO GUIDE CV LINE  Fluoroscopy was utilized by the requesting physician.  No radiographic  interpretation.     I have reviewed the patient's current medications.  Assessment/Plan: 1 ESRD for 2nd HD in am.  Vol ok 2 Anemia on epo 3 influ finished meds 4 HTN fair control P HD , keep even, CLIP    LOS: 7 days   Rosaelena Kemnitz L 11/06/2014,9:33 AM

## 2014-11-06 NOTE — Progress Notes (Signed)
Per pt's family member, [name], pt is requesting a translator before/during his dialysis treatment tomorrow morning.  Tyna Jaksch, RN

## 2014-11-07 ENCOUNTER — Encounter (HOSPITAL_COMMUNITY): Payer: Self-pay | Admitting: Vascular Surgery

## 2014-11-07 LAB — BASIC METABOLIC PANEL
Anion gap: 8 (ref 5–15)
BUN: 57 mg/dL — ABNORMAL HIGH (ref 6–23)
CALCIUM: 8.8 mg/dL (ref 8.4–10.5)
CO2: 26 mmol/L (ref 19–32)
Chloride: 104 mmol/L (ref 96–112)
Creatinine, Ser: 5.79 mg/dL — ABNORMAL HIGH (ref 0.50–1.35)
GFR, EST AFRICAN AMERICAN: 11 mL/min — AB (ref >90)
GFR, EST NON AFRICAN AMERICAN: 9 mL/min — AB (ref >90)
GLUCOSE: 90 mg/dL (ref 70–99)
Potassium: 3.9 mmol/L (ref 3.5–5.1)
SODIUM: 138 mmol/L (ref 135–145)

## 2014-11-07 LAB — CBC
HCT: 28.5 % — ABNORMAL LOW (ref 39.0–52.0)
HEMOGLOBIN: 9.2 g/dL — AB (ref 13.0–17.0)
MCH: 30.1 pg (ref 26.0–34.0)
MCHC: 32.3 g/dL (ref 30.0–36.0)
MCV: 93.1 fL (ref 78.0–100.0)
Platelets: 347 10*3/uL (ref 150–400)
RBC: 3.06 MIL/uL — ABNORMAL LOW (ref 4.22–5.81)
RDW: 14.5 % (ref 11.5–15.5)
WBC: 6.4 10*3/uL (ref 4.0–10.5)

## 2014-11-07 LAB — GLUCOSE, CAPILLARY
Glucose-Capillary: 187 mg/dL — ABNORMAL HIGH (ref 70–99)
Glucose-Capillary: 136 mg/dL — ABNORMAL HIGH (ref 70–99)
Glucose-Capillary: 198 mg/dL — ABNORMAL HIGH (ref 70–99)

## 2014-11-07 MED ORDER — SODIUM CHLORIDE 0.9 % IV SOLN: 100.0000 mL | INTRAVENOUS | Status: DC | PRN

## 2014-11-07 MED ORDER — HEPARIN SODIUM (PORCINE) 1000 UNIT/ML DIALYSIS
100.0000 [IU]/kg | INTRAMUSCULAR | Status: DC | PRN
  Filled 2014-11-07: qty 6

## 2014-11-07 MED ORDER — ALTEPLASE 2 MG IJ SOLR
2.0000 mg | Freq: Once | INTRAMUSCULAR | Status: DC | PRN
  Filled 2014-11-07: qty 2

## 2014-11-07 MED ORDER — NEPRO/CARBSTEADY PO LIQD
237.0000 mL | ORAL | Status: DC | PRN
  Filled 2014-11-07: qty 237

## 2014-11-07 MED ORDER — LIDOCAINE HCL (PF) 1 % IJ SOLN: 5.0000 mL | INTRAMUSCULAR | Status: DC | PRN

## 2014-11-07 MED ORDER — PENTAFLUOROPROP-TETRAFLUOROETH EX AERO: 1.0000 "application " | INHALATION_SPRAY | CUTANEOUS | Status: DC | PRN

## 2014-11-07 MED ORDER — HEPARIN SODIUM (PORCINE) 1000 UNIT/ML DIALYSIS: 1000.0000 [IU] | INTRAMUSCULAR | Status: DC | PRN

## 2014-11-07 MED ORDER — DARBEPOETIN ALFA 150 MCG/0.3ML IJ SOSY
PREFILLED_SYRINGE | INTRAMUSCULAR | Status: AC
  Filled 2014-11-07: qty 0.3

## 2014-11-07 MED ORDER — LIDOCAINE-PRILOCAINE 2.5-2.5 % EX CREA
1.0000 "application " | TOPICAL_CREAM | CUTANEOUS | Status: DC | PRN
  Filled 2014-11-07: qty 5

## 2014-11-07 NOTE — Procedures (Signed)
11/07/14- Educated pt on HD process and was able to answer pt questions in pt native language, provided written info on HD in spanish. Will continue to provide emotional support as needed.

## 2014-11-07 NOTE — Progress Notes (Signed)
PT Cancellation Note  Patient Details Name: Brian Fuller MRN: [id number] DOB: 1951-08-13   Cancelled Treatment:    Reason Eval/Treat Not Completed: Patient at procedure or test/unavailable.    Rolinda Roan 11/07/2014, 7:23 AM   Rolinda Roan, PT, DPT Acute Rehabilitation Services Pager: [phone]

## 2014-11-07 NOTE — Progress Notes (Addendum)
[facility] PROGRESS NOTE  Brian Fuller [id number] DOB: Sep 28, 1950 DOA: 10/30/2014 PCP: PROVIDER NOT IN SYSTEM Interim summary: 64 year old male with h/o CKD, DM, admitted for sob and fever. He was found to have multifocal pneumonia. He has h/o CKD nearing ESRD and requires HD. AV fistula was placed by vascular surgery on 3/16  And a dialysis catheter was put in on 3/18 and he is getting HD the last two days. .   Assessment/Plan: 1. Sepsis from multilobar pneumonia: Admitted to stepdown initially requiring BIPAP. Currently he is breathing improved and he is off [place] oxygen.  CXR on 3/18  shows  Resolution of the pneumonia  He also has completed the course of augmentin for the pneumonia. Cultures are negative so far. Urine for legionella nd strep pneumoniae are negative.    Diabetes mellitus: hgba1c is 6.2. Well controlled.  CBG (last 3)   Recent Labs  11/06/14 2154 11/07/14 1156 11/07/14 1713  GLUCAP 129* 187* 136*    RESUME SSI. No changes in SSI.    HYPERTENSION; Sub optimal yesterday and we started the patient on hydralazine. His bp is much better today.   Anemia: Probably anemia of chronic disease from CKD.  S/p 1 unit of prbc transfusion. Repeat hemoglobin is around 9.  Low iron levels, epo ordered by nephrology.   Stool for occult blood pending. He did not have a BM since 3 days.    CKD:  renal consulted and he underwent AV Fistula placement by vascular surgery on 3/16. Outpatient follow up with vascular for revaluation of maturation. He underwent dialysis catheter placement on 3/18 an getting HD everyday.. Lasix and bicarbonate were stopped by renal.  Creatinine is still hovering around 5. Repeat renal parameters in am and plan for another HD tomorrow.  Outpatient HD to be set up .    Influenza positive: Completed the course of tamiflu.   Hypokalemia: replaced.   PT/OT EVAL ORDERED and recommended no further PT needed at this time.    Hypoalbuminemia: Nutrition consulted.   Mild abdominal discomfort: Probably from constipation. Resolved.   Disposition: Currently waiting for outpatient HD to be set up.  He does not have insurance and he does not status.  He lives in [place]. Friend at bedside. Social worker to assist [place] in helping to set outpatient HD so patient can be discharged.   Code Status: full code.  Family Communication: none at bedside Disposition Plan: pending, home when renal function improves.     Consultants:  Renal.   Vascular surgery   Procedures:  HD  Antibiotics: augmentin. HPI/Subjective: Denies any sob, chest pain, nausea, or vomiting.  No abdominal pain.   Objective: Filed Vitals:   11/07/14 1718  BP: 120/63  Pulse: 74  Temp: 97.9 F (36.6 C)  Resp: 18    Intake/Output Summary (Last 24 hours) at 11/07/14 1912 Last data filed at 11/07/14 1820  Gross per 24 hour  Intake    600 ml  Output      0 ml  Net    600 ml   Filed Weights   11/06/14 2151 11/07/14 0630 11/07/14 0938  Weight: 53.7 kg (118 lb 6.2 oz) 52.8 kg (116 lb 6.5 oz) 52.8 kg (116 lb 6.5 oz)    Exam:   General:  Alert afebrile comfortable  Cardiovascular: s1s2, RRR  Respiratory: clear to auscultation, no wheezing or rhonchi  Abdomen: soft non tender non distended bowel sounds heard.   Musculoskeletal: no pedal edema.   Data Reviewed:  Basic Metabolic Panel:  Recent Labs Lab 11/01/14 0535  11/03/14 0650 11/04/14 0536 11/05/14 0430 11/06/14 0948 11/07/14 0630  NA 135  < > 138 141 136 139 138  K 3.5  < > 3.6 3.7 4.0 3.6 3.9  CL 104  < > 105 106 101 103 104  CO2 21  < > 18* 22 26 25 26   GLUCOSE 103*  < > 164* 120* 90 189* 90  BUN 82*  < > 86* 87* 47* 55* 57*  CREATININE 6.72*  < > 7.03* 7.29* 5.61* 5.86* 5.79*  CALCIUM 7.1*  < > 7.6* 8.2* 8.0* 8.5 8.8  PHOS 6.8*  --  5.5* 4.9*  --   --   --   < > = values in this interval not displayed. Liver Function Tests:  Recent Labs Lab  11/01/14 0535 11/03/14 0650 11/04/14 0536  AST 35  --   --   ALT 25  --   --   ALKPHOS 52  --   --   BILITOT 0.5  --   --   PROT 4.9*  --   --   ALBUMIN 2.0* 2.0* 2.1*   No results for input(s): LIPASE, AMYLASE in the last 168 hours. No results for input(s): AMMONIA in the last 168 hours. CBC:  Recent Labs Lab 11/02/14 0525 11/03/14 0650 11/04/14 0536 11/05/14 0430 11/07/14 0630  WBC 3.6* 3.8* 4.8 4.6 6.4  HGB 8.3* 8.5* 8.3* 8.5* 9.2*  HCT 23.7* 25.3* 24.9* 26.1* 28.5*  MCV 87.5 88.5 90.9 90.6 93.1  PLT 155 178 209 254 347   Cardiac Enzymes: No results for input(s): CKTOTAL, CKMB, CKMBINDEX, TROPONINI in the last 168 hours. BNP (last 3 results) No results for input(s): BNP in the last 8760 hours.  ProBNP (last 3 results) No results for input(s): PROBNP in the last 8760 hours.  CBG:  Recent Labs Lab 11/06/14 0736 11/06/14 1206 11/06/14 2154 11/07/14 1156 11/07/14 1713  GLUCAP 81 160* 129* 187* 136*    Recent Results (from the past 240 hour(s))  Blood Culture (routine x 2)     Status: None   Collection Time: 10/30/14  9:30 AM  Result Value Ref Range Status   Specimen Description BLOOD RIGHT FOREARM  Final   Special Requests BOTTLES DRAWN AEROBIC AND ANAEROBIC 5CC  Final   Culture   Final    NO GROWTH 5 DAYS Performed at [facility]    Report Status 11/05/2014 FINAL  Final  Blood Culture (routine x 2)     Status: None   Collection Time: 10/30/14  9:36 AM  Result Value Ref Range Status   Specimen Description BLOOD LEFT ARM  Final   Special Requests BOTTLES DRAWN AEROBIC AND ANAEROBIC 5CC  Final   Culture   Final    NO GROWTH 5 DAYS Performed at [facility]    Report Status 11/05/2014 FINAL  Final  MRSA PCR Screening     Status: None   Collection Time: 10/30/14  1:05 PM  Result Value Ref Range Status   MRSA by PCR NEGATIVE NEGATIVE Final    Comment:        The GeneXpert MRSA Assay (FDA approved for NASAL specimens only), is one  component of a comprehensive MRSA colonization surveillance program. It is not intended to diagnose MRSA infection nor to guide or monitor treatment for MRSA infections.   Urine culture     Status: None   Collection Time: 10/30/14  1:40 PM  Result  Value Ref Range Status   Specimen Description URINE, CLEAN CATCH  Final   Special Requests NONE  Final   Colony Count   Final    8,000 COLONIES/ML Performed at [facility]    Culture   Final    INSIGNIFICANT GROWTH Performed at [facility]    Report Status 10/31/2014 FINAL  Final     Studies: No results found.  Scheduled Meds: . calcium acetate  667 mg Oral TID WC  . Darbepoetin Alfa      . darbepoetin (ARANESP) injection - NON-DIALYSIS  150 mcg Subcutaneous Q Mon-1800  . heparin  5,000 Units Subcutaneous 3 times per day  . insulin aspart  0-9 Units Subcutaneous TID WC  . multivitamin  1 tablet Oral QHS  . polyethylene glycol  17 g Oral Daily  . senna-docusate  2 tablet Oral BID  . 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     [facility] Pager [phone]. If 7PM-7AM, please contact night-coverage at [email or web address], password [facility] 11/07/2014, 7:12 PM  LOS: 8 days

## 2014-11-07 NOTE — Progress Notes (Signed)
Administration of patient medications with Spanish interpreter services. Representative Dyann Ruddle with ID # [id number]. Pt denies complaints of pain or discomfort. He also reports he has no further questions for me at this time. Dorthey Sawyer, RN

## 2014-11-07 NOTE — Procedures (Signed)
I have seen and examined this patient and agree with the plan of care . Patient was seen on dialysis with no emerging dialysis needs [facility] W 11/07/2014, 11:41 AM

## 2014-11-08 ENCOUNTER — Inpatient Hospital Stay (HOSPITAL_COMMUNITY): Payer: Medicaid Other

## 2014-11-08 LAB — GLUCOSE, CAPILLARY
Glucose-Capillary: 105 mg/dL — ABNORMAL HIGH (ref 70–99)
Glucose-Capillary: 204 mg/dL — ABNORMAL HIGH (ref 70–99)
Glucose-Capillary: 141 mg/dL — ABNORMAL HIGH (ref 70–99)
Glucose-Capillary: 178 mg/dL — ABNORMAL HIGH (ref 70–99)

## 2014-11-08 MED ORDER — AMLODIPINE BESYLATE 10 MG PO TABS
10.0000 mg | ORAL_TABLET | Freq: Every day | ORAL | Status: DC
  Administered 2014-11-08: 10 mg via ORAL
  Filled 2014-11-08 (×2): qty 1

## 2014-11-08 MED ORDER — MAGNESIUM HYDROXIDE 400 MG/5ML PO SUSP
30.0000 mL | Freq: Every day | ORAL | Status: DC
  Administered 2014-11-08 – 2014-11-10 (×3): 30 mL via ORAL
  Filled 2014-11-08 (×3): qty 30

## 2014-11-08 NOTE — Progress Notes (Signed)
[facility] PROGRESS NOTE  Brian Fuller [id number] DOB: 11/27/1950 DOA: 10/30/2014 PCP: PROVIDER NOT IN SYSTEM Interim summary: 64 year old male with h/o CKD, DM, admitted for sob and fever. He was found to have multifocal pneumonia. He has h/o CKD nearing ESRD and requires HD. AV fistula was placed by vascular surgery on 3/16  And a dialysis catheter was put in on 3/18 and he is getting HD the last two days. .   Assessment/Plan: 1. Sepsis from multilobar pneumonia: Admitted to stepdown initially requiring BIPAP. Currently he is breathing improved and he is off [place] oxygen.  CXR on 3/18  shows  Resolution of the pneumonia  He also has completed the course of augmentin for the pneumonia. Cultures are negative so far. Urine for legionella nd strep pneumoniae are negative.    Diabetes mellitus: hgba1c is 6.2. Well controlled.  CBG (last 3)   Recent Labs  11/08/14 0753 11/08/14 1219 11/08/14 1650  GLUCAP 105* 204* 141*    RESUME SSI. No changes in SSI.    HYPERTENSION; Sub optimal , we started him on prn  Hydralazine,. Today we started him on amlodipine 10 mg daily.   Anemia: Probably anemia of chronic disease from CKD.  S/p 1 unit of prbc transfusion.   Low iron levels, epo ordered by nephrology.  Rpt H&H in am.    CKD:  renal consulted and he underwent AV Fistula placement by vascular surgery on 3/16. Outpatient follow up with vascular for revaluation of maturation. He underwent dialysis catheter placement on 3/18 an getting HD everyday.. Lasix and bicarbonate were stopped by renal.  Repeat renal parameters. No HD today.  Awaiting CLIP   Influenza positive: Completed the course of tamiflu.   Hypokalemia: replaced.   PT/OT EVAL ORDERED and recommended no further PT needed at this time.   Hypoalbuminemia: Nutrition consulted.   Mild abdominal discomfort: Probably from constipation. ABD film ordered. Milk of magnesia ordered.    Disposition: Currently waiting for outpatient HD to be set up.  He does not have insurance and he does not status.  He lives in [place]. Social worker to assist [place] in helping to set outpatient HD so patient can be discharged.   Code Status: full code.  Family Communication: none at bedside Disposition Plan: pending, home when renal function improves.     Consultants:  Renal.   Vascular surgery   Procedures:  HD  Antibiotics: augmentin. HPI/Subjective:  No new complaints.   Objective: Filed Vitals:   11/08/14 1706  BP: 162/67  Pulse: 78  Temp: 98.2 F (36.8 C)  Resp: 18    Intake/Output Summary (Last 24 hours) at 11/08/14 1819 Last data filed at 11/08/14 1443  Gross per 24 hour  Intake    600 ml  Output      0 ml  Net    600 ml   Filed Weights   11/06/14 2151 11/07/14 0630 11/07/14 0938  Weight: 53.7 kg (118 lb 6.2 oz) 52.8 kg (116 lb 6.5 oz) 52.8 kg (116 lb 6.5 oz)    Exam:   General:  Alert afebrile comfortable  Cardiovascular: s1s2, RRR  Respiratory: clear to auscultation, no wheezing or rhonchi  Abdomen: soft non tender non distended bowel sounds heard.   Musculoskeletal: no pedal edema.   Data Reviewed: Basic Metabolic Panel:  Recent Labs Lab 11/03/14 0650 11/04/14 0536 11/05/14 0430 11/06/14 0948 11/07/14 0630  NA 138 141 136 139 138  K 3.6 3.7 4.0 3.6 3.9  CL 105  106 101 103 104  CO2 18* 22 26 25 26   GLUCOSE 164* 120* 90 189* 90  BUN 86* 87* 47* 55* 57*  CREATININE 7.03* 7.29* 5.61* 5.86* 5.79*  CALCIUM 7.6* 8.2* 8.0* 8.5 8.8  PHOS 5.5* 4.9*  --   --   --    Liver Function Tests:  Recent Labs Lab 11/03/14 0650 11/04/14 0536  ALBUMIN 2.0* 2.1*   No results for input(s): LIPASE, AMYLASE in the last 168 hours. No results for input(s): AMMONIA in the last 168 hours. CBC:  Recent Labs Lab 11/02/14 0525 11/03/14 0650 11/04/14 0536 11/05/14 0430 11/07/14 0630  WBC 3.6* 3.8* 4.8 4.6 6.4  HGB 8.3* 8.5* 8.3* 8.5*  9.2*  HCT 23.7* 25.3* 24.9* 26.1* 28.5*  MCV 87.5 88.5 90.9 90.6 93.1  PLT 155 178 209 254 347   Cardiac Enzymes: No results for input(s): CKTOTAL, CKMB, CKMBINDEX, TROPONINI in the last 168 hours. BNP (last 3 results) No results for input(s): BNP in the last 8760 hours.  ProBNP (last 3 results) No results for input(s): PROBNP in the last 8760 hours.  CBG:  Recent Labs Lab 11/07/14 1713 11/07/14 2131 11/08/14 0753 11/08/14 1219 11/08/14 1650  GLUCAP 136* 198* 105* 204* 141*    Recent Results (from the past 240 hour(s))  Blood Culture (routine x 2)     Status: None   Collection Time: 10/30/14  9:30 AM  Result Value Ref Range Status   Specimen Description BLOOD RIGHT FOREARM  Final   Special Requests BOTTLES DRAWN AEROBIC AND ANAEROBIC 5CC  Final   Culture   Final    NO GROWTH 5 DAYS Performed at [facility]    Report Status 11/05/2014 FINAL  Final  Blood Culture (routine x 2)     Status: None   Collection Time: 10/30/14  9:36 AM  Result Value Ref Range Status   Specimen Description BLOOD LEFT ARM  Final   Special Requests BOTTLES DRAWN AEROBIC AND ANAEROBIC 5CC  Final   Culture   Final    NO GROWTH 5 DAYS Performed at [facility]    Report Status 11/05/2014 FINAL  Final  MRSA PCR Screening     Status: None   Collection Time: 10/30/14  1:05 PM  Result Value Ref Range Status   MRSA by PCR NEGATIVE NEGATIVE Final    Comment:        The GeneXpert MRSA Assay (FDA approved for NASAL specimens only), is one component of a comprehensive MRSA colonization surveillance program. It is not intended to diagnose MRSA infection nor to guide or monitor treatment for MRSA infections.   Urine culture     Status: None   Collection Time: 10/30/14  1:40 PM  Result Value Ref Range Status   Specimen Description URINE, CLEAN CATCH  Final   Special Requests NONE  Final   Colony Count   Final    8,000 COLONIES/ML Performed at [facility]     Culture   Final    INSIGNIFICANT GROWTH Performed at [facility]    Report Status 10/31/2014 FINAL  Final     Studies: No results found.  Scheduled Meds: . calcium acetate  667 mg Oral TID WC  . darbepoetin (ARANESP) injection - NON-DIALYSIS  150 mcg Subcutaneous Q Mon-1800  . heparin  5,000 Units Subcutaneous 3 times per day  . insulin aspart  0-9 Units Subcutaneous TID WC  . multivitamin  1 tablet Oral QHS  . polyethylene glycol  17 g Oral Daily  . senna-docusate  2 tablet Oral BID  . 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     [facility] Pager [phone]. If 7PM-7AM, please contact night-coverage at [email or web address], password [facility] 11/08/2014, 6:19 PM  LOS: 9 days

## 2014-11-08 NOTE — Progress Notes (Signed)
Physical Therapy Treatment Patient Details Name: Brian Fuller MRN: [id number] DOB: [birth year] Today's Date: 11/08/2014    History of Present Illness Pt is a Spanish speaking 64 year old gentleman who was admitted to the hospital with sepsis and multifocal pneumonia and respiratory failure. He was volume overloaded. He has a history of renal insufficiency. On 3/16 pt had a L radial-cephalic AV fistula created.     PT Comments    Pt doing well. Reports he has been doing his HEP in room.  Still demonstrates unsteadiness at times with gait, but primarily with higher level activities and no LOB.  Follow Up Recommendations  No PT follow up;Supervision - Intermittent     Equipment Recommendations  None recommended by PT    Recommendations for Other Services       Precautions / Restrictions Precautions Precautions: Fall Restrictions Weight Bearing Restrictions: No    Mobility  Bed Mobility Overal bed mobility: Independent                Transfers Overall transfer level: Independent Equipment used: None   Sit to Stand: Modified independent (Device/Increase time)         General transfer comment: MOD I from bed  Ambulation/Gait Ambulation/Gait assistance: Min guard;Supervision Ambulation Distance (Feet): 300 Feet Assistive device: None;Straight cane Gait Pattern/deviations: Drifts right/left;Step-through pattern     General Gait Details: Pt ambulated with cane for half of gait but felt it was not helping and walked second half without cane. Performed head nods, head turns, sudden stops and turns, as increased gait speed.  pt with increased unsteadiness with head turns. Amb with S with occasional MIN/guard.   Stairs            Wheelchair Mobility    Modified Rankin (Stroke Patients Only)       Balance     Sitting balance-Leahy Scale: Good       Standing balance-Leahy Scale: Fair               High level balance activites:  Backward walking;Side stepping;Turns;Head turns;Sudden stops;Direction changes;Other (comment) (tandem gait) High Level Balance Comments: decreased balance with head turns and tandem gait.    Cognition Arousal/Alertness: Awake/alert Behavior During Therapy: WFL for tasks assessed/performed Overall Cognitive Status: Within Functional Limits for tasks assessed                      Exercises      General Comments General comments (skin integrity, edema, etc.): Pt and friend report he has been doing his standing therex program issued to him last PT session and he has been ambulating in room for exercise.      Pertinent Vitals/Pain Pain Assessment: No/denies pain    Home Living                      Prior Function            PT Goals (current goals can now be found in the care plan section) Acute Rehab PT Goals PT Goal Formulation: With patient/family Time For Goal Achievement: 11/17/14 Potential to Achieve Goals: Good Progress towards PT goals: Progressing toward goals    Frequency  Min 2X/week    PT Plan Current plan remains appropriate;Frequency needs to be updated    Co-evaluation             End of Session   Activity Tolerance: Patient tolerated treatment well Patient left: in bed;with call bell/phone within reach;with family/visitor present  Time: 1244-1300 PT Time Calculation (min) (ACUTE ONLY): 16 min  Charges:  $Gait Training: 8-22 mins                    G Codes:      Brian Fuller 11/08/2014, 1:44 PM

## 2014-11-08 NOTE — Progress Notes (Signed)
[facility] ROUNDING NOTE   Subjective:   Interval History: no complaints today appears to be doing well  Objective:  Vital signs in last 24 hours:  Temp:  [97.6 F (36.4 C)-98.4 F (36.9 C)] 97.6 F (36.4 C) (03/22 1006) Pulse Rate:  [74-79] 78 (03/22 1006) Resp:  [18-19] 18 (03/22 1006) BP: (120-168)/(57-87) 124/57 mmHg (03/22 1006) SpO2:  [96 %-99 %] 99 % (03/22 1006)  Weight change: -0.9 kg (-1 lb 15.7 oz) Filed Weights   11/06/14 2151 11/07/14 0630 11/07/14 0938  Weight: 53.7 kg (118 lb 6.2 oz) 52.8 kg (116 lb 6.5 oz) 52.8 kg (116 lb 6.5 oz)    Intake/Output: I/O last 3 completed shifts: In: 600 [P.O.:600] Out: 0    Intake/Output this shift:     CVS- RRR RS- CTA ABD- BS present soft non-distended EXT- no edema  AVF left cimino thrill   Basic Metabolic Panel:  Recent Labs Lab 11/03/14 0650 11/04/14 0536 11/05/14 0430 11/06/14 0948 11/07/14 0630  NA 138 141 136 139 138  K 3.6 3.7 4.0 3.6 3.9  CL 105 106 101 103 104  CO2 18* 22 26 25 26   GLUCOSE 164* 120* 90 189* 90  BUN 86* 87* 47* 55* 57*  CREATININE 7.03* 7.29* 5.61* 5.86* 5.79*  CALCIUM 7.6* 8.2* 8.0* 8.5 8.8  PHOS 5.5* 4.9*  --   --   --     Liver Function Tests:  Recent Labs Lab 11/03/14 0650 11/04/14 0536  ALBUMIN 2.0* 2.1*   No results for input(s): LIPASE, AMYLASE in the last 168 hours. No results for input(s): AMMONIA in the last 168 hours.  CBC:  Recent Labs Lab 11/02/14 0525 11/03/14 0650 11/04/14 0536 11/05/14 0430 11/07/14 0630  WBC 3.6* 3.8* 4.8 4.6 6.4  HGB 8.3* 8.5* 8.3* 8.5* 9.2*  HCT 23.7* 25.3* 24.9* 26.1* 28.5*  MCV 87.5 88.5 90.9 90.6 93.1  PLT 155 178 209 254 347    Cardiac Enzymes: No results for input(s): CKTOTAL, CKMB, CKMBINDEX, TROPONINI in the last 168 hours.  BNP: Invalid input(s): POCBNP  CBG:  Recent Labs Lab 11/06/14 2154 11/07/14 1156 11/07/14 1713 11/07/14 2131 11/08/14 0753  GLUCAP 129* 187* 136* 198* 105*     Microbiology: Results for orders placed or performed during the hospital encounter of 10/30/14  Blood Culture (routine x 2)     Status: None   Collection Time: 10/30/14  9:30 AM  Result Value Ref Range Status   Specimen Description BLOOD RIGHT FOREARM  Final   Special Requests BOTTLES DRAWN AEROBIC AND ANAEROBIC 5CC  Final   Culture   Final    NO GROWTH 5 DAYS Performed at [facility]    Report Status 11/05/2014 FINAL  Final  Blood Culture (routine x 2)     Status: None   Collection Time: 10/30/14  9:36 AM  Result Value Ref Range Status   Specimen Description BLOOD LEFT ARM  Final   Special Requests BOTTLES DRAWN AEROBIC AND ANAEROBIC 5CC  Final   Culture   Final    NO GROWTH 5 DAYS Performed at [facility]    Report Status 11/05/2014 FINAL  Final  MRSA PCR Screening     Status: None   Collection Time: 10/30/14  1:05 PM  Result Value Ref Range Status   MRSA by PCR NEGATIVE NEGATIVE Final    Comment:        The GeneXpert MRSA Assay (FDA approved for NASAL specimens only), is one component  of a comprehensive MRSA colonization surveillance program. It is not intended to diagnose MRSA infection nor to guide or monitor treatment for MRSA infections.   Urine culture     Status: None   Collection Time: 10/30/14  1:40 PM  Result Value Ref Range Status   Specimen Description URINE, CLEAN CATCH  Final   Special Requests NONE  Final   Colony Count   Final    8,000 COLONIES/ML Performed at [facility]    Culture   Final    INSIGNIFICANT GROWTH Performed at [facility]    Report Status 10/31/2014 FINAL  Final    Coagulation Studies: No results for input(s): LABPROT, INR in the last 72 hours.  Urinalysis: No results for input(s): COLORURINE, LABSPEC, PHURINE, GLUCOSEU, HGBUR, BILIRUBINUR, KETONESUR, PROTEINUR, UROBILINOGEN, NITRITE, LEUKOCYTESUR in the last 72 hours.  Invalid input(s): APPERANCEUR    Imaging: No results  found.   Medications:     . calcium acetate  667 mg Oral TID WC  . darbepoetin (ARANESP) injection - NON-DIALYSIS  150 mcg Subcutaneous Q Mon-1800  . heparin  5,000 Units Subcutaneous 3 times per day  . insulin aspart  0-9 Units Subcutaneous TID WC  . multivitamin  1 tablet Oral QHS  . polyethylene glycol  17 g Oral Daily  . senna-docusate  2 tablet Oral BID  . 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

## 2014-11-09 DIAGNOSIS — E088 Diabetes mellitus due to underlying condition with unspecified complications: Secondary | ICD-10-CM

## 2014-11-09 DIAGNOSIS — I10 Essential (primary) hypertension: Secondary | ICD-10-CM

## 2014-11-09 LAB — CBC
HCT: 30.4 % — ABNORMAL LOW (ref 39.0–52.0)
HEMOGLOBIN: 9.5 g/dL — AB (ref 13.0–17.0)
MCH: 29.8 pg (ref 26.0–34.0)
MCHC: 31.3 g/dL (ref 30.0–36.0)
MCV: 95.3 fL (ref 78.0–100.0)
Platelets: 371 10*3/uL (ref 150–400)
RBC: 3.19 MIL/uL — ABNORMAL LOW (ref 4.22–5.81)
RDW: 15.9 % — ABNORMAL HIGH (ref 11.5–15.5)
WBC: 7 10*3/uL (ref 4.0–10.5)

## 2014-11-09 LAB — BASIC METABOLIC PANEL
Anion gap: 5 (ref 5–15)
BUN: 40 mg/dL — AB (ref 6–23)
CO2: 30 mmol/L (ref 19–32)
Calcium: 8.9 mg/dL (ref 8.4–10.5)
Chloride: 101 mmol/L (ref 96–112)
Creatinine, Ser: 4.74 mg/dL — ABNORMAL HIGH (ref 0.50–1.35)
GFR calc Af Amer: 14 mL/min — ABNORMAL LOW (ref >90)
GFR calc non Af Amer: 12 mL/min — ABNORMAL LOW (ref >90)
Glucose, Bld: 138 mg/dL — ABNORMAL HIGH (ref 70–99)
POTASSIUM: 4.4 mmol/L (ref 3.5–5.1)
Sodium: 136 mmol/L (ref 135–145)

## 2014-11-09 LAB — GLUCOSE, CAPILLARY
GLUCOSE-CAPILLARY: 124 mg/dL — AB (ref 70–99)
Glucose-Capillary: 109 mg/dL — ABNORMAL HIGH (ref 70–99)
Glucose-Capillary: 267 mg/dL — ABNORMAL HIGH (ref 70–99)
Glucose-Capillary: 209 mg/dL — ABNORMAL HIGH (ref 70–99)

## 2014-11-09 MED ORDER — SODIUM CHLORIDE 0.9 % IV SOLN: 100.0000 mL | INTRAVENOUS | Status: DC | PRN

## 2014-11-09 MED ORDER — AMLODIPINE BESYLATE 5 MG PO TABS
5.0000 mg | ORAL_TABLET | Freq: Every day | ORAL | Status: DC
  Administered 2014-11-10: 5 mg via ORAL
  Filled 2014-11-09: qty 1

## 2014-11-09 MED ORDER — HEPARIN SODIUM (PORCINE) 1000 UNIT/ML DIALYSIS: 1000.0000 [IU] | INTRAMUSCULAR | Status: DC | PRN

## 2014-11-09 MED ORDER — SODIUM CHLORIDE 0.9 % IV SOLN
100.0000 mL | INTRAVENOUS | Status: DC | PRN
Start: 2014-11-09 — End: 2014-11-09

## 2014-11-09 MED ORDER — ALTEPLASE 2 MG IJ SOLR
2.0000 mg | Freq: Once | INTRAMUSCULAR | Status: DC | PRN
  Filled 2014-11-09: qty 2

## 2014-11-09 MED ORDER — LIDOCAINE-PRILOCAINE 2.5-2.5 % EX CREA
1.0000 | TOPICAL_CREAM | CUTANEOUS | Status: DC | PRN
Start: 2014-11-09 — End: 2014-11-09
  Filled 2014-11-09: qty 5

## 2014-11-09 MED ORDER — NEPRO/CARBSTEADY PO LIQD
237.0000 mL | ORAL | Status: DC | PRN
Start: 2014-11-09 — End: 2014-11-09
  Filled 2014-11-09: qty 237

## 2014-11-09 MED ORDER — PENTAFLUOROPROP-TETRAFLUOROETH EX AERO: 1.0000 "application " | INHALATION_SPRAY | CUTANEOUS | Status: DC | PRN

## 2014-11-09 MED ORDER — LIDOCAINE HCL (PF) 1 % IJ SOLN: 5.0000 mL | INTRAMUSCULAR | Status: DC | PRN

## 2014-11-09 NOTE — Progress Notes (Signed)
[facility] PROGRESS NOTE  Brian Fuller [id number] DOB: April 01, 1951 DOA: 10/30/2014  PCP: PROVIDER NOT IN SYSTEM  Interim summary: 64 year old male with h/o CKD, DM, admitted for sob and fever. He was found to have multifocal pneumonia. He has h/o CKD nearing ESRD and requires HD. AV fistula was placed by vascular surgery on 3/16  And a dialysis catheter was put in on 3/18 and he is getting HD.   Assessment/Plan:  Sepsis from multilobar pneumonia Admitted to stepdown initially requiring BIPAP. Patient subsequently improved. He was transferred to from. He has completed course of antibiotics. CXR on 3/18 shows resolution of the pneumonia. Cultures were negative. Urine for legionella and strep pneumoniae are negative.   Diabetes mellitus, type II hgba1c is 6.2. Well controlled. Continue SSI.  Essential HYPERTENSION; Well-controlled on current regimen. Would like to avoid excessive drop in blood pressure. Will cut back on the dose of amlodipine.  Normocytic Anemia Probably anemia of chronic disease from CKD. S/p 1 unit of prbc transfusion. Low iron levels, epo ordered by nephrology.   CKD stage V, now end-stage renal disease Nephrologist following. He underwent AV Fistula placement by vascular surgery on 3/16. Outpatient follow up with vascular for revaluation of maturation. He underwent dialysis catheter placement on 3/18 and getting HD per nephrology. Awaiting CLIP.  Influenza Completed the course of tamiflu.   Hypoalbuminemia Nutrition consulted.   Mild abdominal discomfort Probably from constipation. ABD film was unremarkable. Symptoms appear to have improved.   Disposition: Currently waiting for outpatient HD to be set up. He does not have insurance. Social worker to assist [place] in helping to set outpatient HD so patient can be discharged. Seen by physical therapy. Does not have any therapy needs.  DVT prophylaxis: Heparin Code Status: full code.  Family  Communication: none at bedside   Consultants:  Nephrology.   Vascular surgery   Procedures:  HD  Vascular access placement and fistula placement  Antibiotics: Completed course of antibiotics   Subjective: No complaints. States he feels good.  Objective: Filed Vitals:   11/09/14 1131  BP: 149/79  Pulse: 76  Temp:   Resp:     Intake/Output Summary (Last 24 hours) at 11/09/14 1141 Last data filed at 11/09/14 1009  Gross per 24 hour  Intake   1083 ml  Output      0 ml  Net   1083 ml   Filed Weights   11/07/14 0938 11/08/14 2030 11/09/14 1055  Weight: 52.8 kg (116 lb 6.5 oz) 53.1 kg (117 lb 1 oz) 55 kg (121 lb 4.1 oz)    Exam:   General:  Alert afebrile comfortable  Cardiovascular: s1s2, RRR  Respiratory: clear to auscultation, no wheezing or rhonchi  Abdomen: soft non tender non distended bowel sounds heard.   Musculoskeletal: no pedal edema.   Data: Basic Metabolic Panel:  Recent Labs Lab 11/03/14 0650 11/04/14 0536 11/05/14 0430 11/06/14 0948 11/07/14 0630 11/09/14 0538  NA 138 141 136 139 138 136  K 3.6 3.7 4.0 3.6 3.9 4.4  CL 105 106 101 103 104 101  CO2 18* 22 26 25 26 30   GLUCOSE 164* 120* 90 189* 90 138*  BUN 86* 87* 47* 55* 57* 40*  CREATININE 7.03* 7.29* 5.61* 5.86* 5.79* 4.74*  CALCIUM 7.6* 8.2* 8.0* 8.5 8.8 8.9  PHOS 5.5* 4.9*  --   --   --   --    Liver Function Tests:  Recent Labs Lab 11/03/14 0650 11/04/14 0536  ALBUMIN 2.0*  2.1*   CBC:  Recent Labs Lab 11/03/14 0650 11/04/14 0536 11/05/14 0430 11/07/14 0630 11/09/14 0538  WBC 3.8* 4.8 4.6 6.4 7.0  HGB 8.5* 8.3* 8.5* 9.2* 9.5*  HCT 25.3* 24.9* 26.1* 28.5* 30.4*  MCV 88.5 90.9 90.6 93.1 95.3  PLT 178 209 254 347 371   CBG:  Recent Labs Lab 11/08/14 0753 11/08/14 1219 11/08/14 1650 11/08/14 2029 11/09/14 0750  GLUCAP 105* 204* 141* 178* 109*    Recent Results (from the past 240 hour(s))  MRSA PCR Screening     Status: None   Collection Time:  10/30/14  1:05 PM  Result Value Ref Range Status   MRSA by PCR NEGATIVE NEGATIVE Final    Comment:        The GeneXpert MRSA Assay (FDA approved for NASAL specimens only), is one component of a comprehensive MRSA colonization surveillance program. It is not intended to diagnose MRSA infection nor to guide or monitor treatment for MRSA infections.   Urine culture     Status: None   Collection Time: 10/30/14  1:40 PM  Result Value Ref Range Status   Specimen Description URINE, CLEAN CATCH  Final   Special Requests NONE  Final   Colony Count   Final    8,000 COLONIES/ML Performed at [facility]    Culture   Final    INSIGNIFICANT GROWTH Performed at [facility]    Report Status 10/31/2014 FINAL  Final     Studies: Dg Abd 2 Views  11/08/2014   CLINICAL DATA:  Constipation.  EXAM: ABDOMEN - 2 VIEW  COMPARISON:  None.  FINDINGS: The bowel gas pattern is normal. There is no evidence of free air. No radio-opaque calculi or other significant radiographic abnormality is seen.  IMPRESSION: Negative.   Electronically Signed   By: Kerby Moors M.D.   On: 11/08/2014 19:03    Scheduled Meds: . amLODipine  10 mg Oral Daily  . calcium acetate  667 mg Oral TID WC  . darbepoetin (ARANESP) injection - NON-DIALYSIS  150 mcg Subcutaneous Q Mon-1800  . heparin  5,000 Units Subcutaneous 3 times per day  . insulin aspart  0-9 Units Subcutaneous TID WC  . magnesium hydroxide  30 mL Oral Daily  . multivitamin  1 tablet Oral QHS  . polyethylene glycol  17 g Oral Daily  . senna-docusate  2 tablet Oral BID  . 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     [facility] Pager [phone].   If 7PM-7AM, please contact night-coverage at [email or web address], password [facility] 11/09/2014, 11:41 AM  LOS: 10 days

## 2014-11-09 NOTE — Progress Notes (Signed)
Administration of patient's morning medications with Spanish interpretation services. Pt denies pain or discomfort. Pt requested shampoo to wash his hair but reports he has no further questions for me at this time. Tyna Jaksch, RN

## 2014-11-10 LAB — GLUCOSE, CAPILLARY
GLUCOSE-CAPILLARY: 103 mg/dL — AB (ref 70–99)
Glucose-Capillary: 197 mg/dL — ABNORMAL HIGH (ref 70–99)

## 2014-11-10 MED ORDER — CALCIUM ACETATE (PHOS BINDER) 667 MG PO CAPS: 667.0000 mg | ORAL_CAPSULE | Freq: Three times a day (TID) | ORAL | Status: DC

## 2014-11-10 MED ORDER — RENA-VITE PO TABS: 1.0000 | ORAL_TABLET | Freq: Every day | ORAL | Status: DC

## 2014-11-10 MED ORDER — AMLODIPINE BESYLATE 5 MG PO TABS: 5.0000 mg | ORAL_TABLET | ORAL | Status: DC

## 2014-11-10 NOTE — Discharge Instructions (Signed)
Dilisis (Dialysis) La dilisis es un procedimiento que reemplaza parte de la funcin que [employer] riones sanos. Se realiza cuando se pierde alrededor del 85 al 90% de la funcin renal. Tambin puede indicarse antes si tuviera la posibilidad de que los sntomas mejoraran con la dilisis. Durante la dilisis, se eliminan los desechos, sales y Chartered loss adjuster en exceso de la [place], y se Occupational hygienist nivel de ciertas sustancias qumicas en la sangre (como el potasio). La dilisis se realiza en sesiones. Las sesiones de dilisis se continan hasta que los riones [place]. Si los riones no mejoran, como sucede en la enfermedad renal terminal, la dilisis se contina de por vida o hasta que pueda recibir un nuevo rin (trasplante renal). Hay dos tipos de dilisis: hemodilisis y dilisis peritoneal. QU ES [place]?  La hemodilisis es un tipo de dilisis en la que se utiliza una mquina llamada dializador para Curator [place]. Antes de comenzar la hemodilisis, le harn una ciruga para crear un sitio en el que la sangre pueda extraerse del cuerpo y volver a ingresar en l (acceso vascular). Hay tres tipos de accesos vasculares:  Fstula arteriovenosa. Para crear este tipo de acceso, se conecta una arteria a una vena (generalmente en el brazo). La fstula demora entre 1 y 6 meses para desarrollarse despus de la [place]. Si se desarrolla adecuadamente, generalmente su duracin es mayor que los otros tipos de accesos vasculares. Tambin es menos probable que se infecte y se formen cogulos sanguneos.  Injerto arteriovenoso. Para crear este tipo de acceso, se conectan una arteria y [place] vena del brazo con un tubo. El injerto puede usarse de 2 a 3 semanas despus de la [place].  Catter venoso. Para crear este tipo de acceso, se coloca un tubo delgado y flexible (catter) en una vena grande del cuello, el trax o la ingle. El catter puede usarse inmediatamente. Por lo general, se [place] como un acceso  temporario cuando es necesario que la dilisis comience de inmediato. Durante la hemodilisis, la [employer] del cuerpo a travs del Paramedic. Viaja a travs del tubo al dializador, donde se Animal nutritionist. Luego la sangre retorna al cuerpo a travs de otro tubo. La hemodilisis generalmente la realiza un mdico en el hospital o en un centro de dilisis, tres veces por semana. Las sesiones duran entre 3 y 4 horas. Tambin puede [employer], con la ayuda de una persona entrenada.  QU ES LA DILISIS PERITONEAL? La dilisis peritoneal es un tipo de dilisis en el que se [place] como filtro la delgada membrana que cubre el abdomen (peritoneo). Antes de comenzar con la dilisis peritoneal, le harn una ciruga para colocar un catter en el abdomen. El catter se usar para introducir y Regulatory affairs officer del abdomen un lquido llamado dialisato. Al inicio de la sesin, llenarn su abdomen con dialisato. Durante la sesin, los desechos, las sales y los lquidos en exceso de la sangre pasan a travs del peritoneo y [place]. El dialisato se drena del cuerpo al finalizar la sesin. El proceso de llenar y drenar el dialisato se llama intercambio. Los intercambios se repiten [employer] haya usado todo el dialisato del da. La dilisis peritoneal puede llevarse a cabo en el hogar o en casi cualquier Visual merchandiser. Se realiza [employer]. Es posible que necesite hasta cinco intercambios por Training and development officer. La cantidad de tiempo que permanece el dialisato en el organismo entre los intercambios se llama tiempo de permanencia. El tiempo de permanencia depende del  nmero de intercambios necesarios y de las caractersticas del peritoneo. Generalmente, oscila entre 1,5y 3horas. Podr llevar una vida normal [employer]. Como Doctor, general practice, los intercambios podrn realizarse durante la noche, [place] duerme, con un dispositivo Location manager. QU TIPO DE DILISIS DEBO ELEGIR?  Tanto la hemodilisis como la dilisis peritoneal  tienen ventajas y desventajas. Hable con su mdico acerca de qu tipo de dilisis es el mejor para usted. Hay que considerar sus preferencias, su estilo de vida y su enfermedad. En algunos casos, puede elegirse solo un tipo de dilisis.  Ventajas de la hemodilisis  Se realiza con menos frecuencia que la dilisis peritoneal.  Otra persona puede hacer la dilisis por usted.  Si concurre a Comptroller en dilisis, Child psychotherapist los problemas inmediatamente.  En el centro de dilisis, podr interactuar con otras personas que se estn haciendo dilisis. Aqu podr encontrar apoyo emocional. Desventajas de la hemodilisis  La hemodilisis puede causar calambres e hipotensin arterial. Puede dejarle una sensacin de [employer] en que le realizan el [place].  Si concurre a un centro de dilisis, deber concertar citas semanales en los horarios disponibles del centro.  Deber tener ms cuidado cuando viaje. Si concurre a Doctor, general practice de dilisis ser necesario que haga arreglos para Pension scheme manager un centro de dilisis cercano a su casa. Si realiza el tratamiento en su casa, ser necesario que lleve el dializador con usted hasta su lugar de destino.  Ser necesario que evite ms alimentos de los que debe evitar en la dilisis peritoneal. Ventajas de la dilisis peritoneal  Es menos probable que cause calambres e hipotensin arterial.  Podr realizar los intercambios usted mismo, donde se encuentre, incluso cuando viaje.  No es necesario que evite tantos alimentos como en la hemodilisis. Desventajas de la dilisis peritoneal  Debe realizarse con ms frecuencia que la hemodilisis.  La dilisis peritoneal requiere destreza con las manos. Tambin debe ser capaz de levantar bolsas.  Tendr que aprender tcnicas de esterilizacin. Deber practicarlas [employer] para reducir el riesgo de infeccin. QU CAMBIOS DEBER HACER EN [place]? Tanto  en la hemodilisis como en la dilisis peritoneal se requiere que haga algunos cambios en su dieta. Por ejemplo, deber limitar su ingesta de alimentos con elevado contenido de fsforo y Field seismologist. Tambin deber limitar su ingesta de lquidos. Su nutricionista puede ayudar a planificar sus comidas. Un buen plan de comidas puede mejorar la dilisis y su salud.  [place]? Adaptarse al tratamiento de dilisis, a las citas programadas y a Lawyer. Puede que sea necesario que deje de trabajar y no pueda hacer algunas de las cosas que normalmente hace. Es probable que se sienta ansioso o deprimido cuando inicie la dilisis. Con el tiempo, muchas personas se sienten mejor debido a la dilisis. Algunos pueden volver a trabajar despus de Production designer, theatre/television/film cambios, como disminuir la intensidad del [place]. DNDE Dolan Amen MS INFORMACIN?   [place]): www.kidney.org  Asociacin Americana de Pacientes Renales (American Association of Kidney Patients, AAKP): BombTimer.gl  [place] para Problemas Renales ([place]): www.kidneyfund.org Document Released: 08/05/2005 Document Revised: 12/20/2013 [facility] Patient Information 2015 [place], [place]. This information is not intended to replace advice given to you by your health care provider. Make sure you discuss any questions you have with your health care provider.

## 2014-11-10 NOTE — Progress Notes (Signed)
11/10/2014 8:16 AM Hemodialysis Outpatient Note; this patient has been accepted at the [facility] center on a Tuesday, Thursday and Saturday 2nd shift schedule. If the patient is to begin this Saturday March 26th he must visit the center on Friday between 1pm-3:30pm to sign paperwork and consents. This has all been explained to the patient and his wife via an interpreter. Thank you. Brian Fuller

## 2014-11-10 NOTE — Care Management Note (Addendum)
  CARE MANAGEMENT NOTE 11/10/2014  Patient:  Brian Fuller,Brian Fuller   Account Number:  [id number]  Date Initiated:  11/10/2014  Documentation initiated by:  Naoki Migliaccio  Subjective/Objective Assessment:   CM followed this pt for progression and d/c planning.     Action/Plan:   11/10/2014 Pt for d/c today, [place] letter given. HD arranged by HD Unit and pt given instructions with intrepreter. Pt given info on [place].   Anticipated DC Date:  11/10/2014   Anticipated DC Plan:  HOME/SELF CARE         Choice offered to / List presented to:             Status of service:  Completed, signed off Medicare Important Message given?   (If response is "NO", the following Medicare IM given date fields will be blank) Date Medicare IM given:   Medicare IM given by:   Date Additional Medicare IM given:   Additional Medicare IM given by:    Discharge Disposition:  HOME/SELF CARE  Per UR Regulation:    If discussed at Long Length of Stay Meetings, dates discussed:    Comments:   [place] letter given to pt to purchase medications, followup appointment scheduled for Monday, November 14, 2014 @ 9am.  Demetrios Isaacs RN MPH, case manager, [phone]

## 2014-11-10 NOTE — Discharge Summary (Signed)
[facility]  Physician Discharge Summary   Patient ID: Brian Fuller MRN: [id number] DOB/AGE: Apr 03, 1951 64 y.o.  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 He's been asked to call to his newly established outpatient dialysis clinic for paperwork on Friday. 2. Next dialysis will be on Saturday.  DISCHARGE CONDITION: fair  Diet recommendation: Modified carbohydrate  Filed Weights   11/08/14 2030 11/09/14 1055 11/09/14 1440  Weight: 53.1 kg (117 lb 1 oz) 55 kg (121 lb 4.1 oz) 54.1 kg (119 lb 4.3 oz)    INITIAL HISTORY: 64 year old male with h/o CKD, DM, admitted for sob and fever. He was found to have multifocal pneumonia. He has h/o CKD nearing ESRD and requires HD. AV fistula was placed by vascular surgery on 3/16 And a dialysis catheter was put in on 3/18 and he was started on hemodialysis.    Consultants:  Nephrology.   Vascular surgery  Procedures:  HD  Vascular access placement and fistula placement  HOSPITAL COURSE:   History of CKD stage V, Now End-Stage Renal Disease Patient was seen by nephrology right from the outset. They were monitoring him closely and had placed him on Lasix. However, his renal function continued to get worse. An AV fistula was placed by vascular surgery. But he was thought to require dialysis as inpatient. Subsequently, a  dialysis catheter was placed as well. Dialysis has been initiated. We were waiting for outpatient access to dialysis clinic. This has been set up at [facility]. His next dialysis session will be on Saturday. This has been discussed with Dr. Justin Mend. Patient has follow-up with vascular surgery regarding his AV fistula maturation. Once the fistula is ready to be used the catheter will need to be pulled out.   Sepsis from multilobar pneumonia Patient was initially admitted to stepdown. He required BiPAP briefly. He was started on broad-spectrum antibiotics. He was also noted to have influenza and was started on Tamiflu. He slowly improved. He was transferred to the floor. He completed the course of antibiotics as well as Tamiflu. CXR on 3/18 shows resolution of the pneumonia. Cultures were negative. Urine for legionella and strep pneumoniae are negative.   Diabetes mellitus, type II This diagnosis remains questionable. He was not on any diabetic medications at home. Hba1c is 6.2. For now, just diet control. He will need to follow-up with outpatient providers to discuss definitive management.  Essential HYPERTENSION; Well-controlled. Would like to avoid excessive drop in blood pressure. We did cut back on the dose of amlodipine. He'll be prescribed amlodipine to be taken on Monday, Wednesday and Friday only.  Normocytic Anemia Probably anemia of chronic disease from CKD. He was transfused 1 unit of prbc transfusion. Low iron levels, epo ordered by nephrology.   Influenza Completed the course of tamiflu.   Overall improved. He is keen on going home. Discussed with him through interpreter. Okay for discharge today. Cleared by Dr. Justin Mend as well.  PERTINENT LABS:  The results of significant diagnostics from this hospitalization (including imaging, microbiology, ancillary and laboratory) are listed below for reference.    Labs: Basic Metabolic Panel:  Recent Labs Lab 11/04/14 0536 11/05/14 0430  11/06/14 0948 11/07/14 0630 11/09/14 0538  NA 141 136 139 138 136  K 3.7 4.0 3.6 3.9 4.4  CL 106 101 103 104 101  CO2 22 26 25 26 30   GLUCOSE 120* 90 189* 90 138*  BUN 87* 47* 55* 57* 40*  CREATININE 7.29* 5.61* 5.86* 5.79* 4.74*  CALCIUM 8.2* 8.0* 8.5 8.8 8.9  PHOS 4.9*  --   --   --   --    Liver Function Tests:  Recent Labs Lab 11/04/14 0536  ALBUMIN 2.1*   CBC:  Recent Labs Lab 11/04/14 0536 11/05/14 0430 11/07/14 0630 11/09/14 0538  WBC 4.8 4.6 6.4 7.0  HGB 8.3* 8.5* 9.2* 9.5*  HCT 24.9* 26.1* 28.5* 30.4*  MCV 90.9 90.6 93.1 95.3  PLT 209 254 347 371   CBG:  Recent Labs Lab 11/09/14 1501 11/09/14 1728 11/09/14 2049 11/10/14 0729 11/10/14 1144  GLUCAP 124* 267* 209* 103* 197*     IMAGING STUDIES Dg Chest Port 1 View  11/04/2014   CLINICAL DATA:  Post dialysis catheter placement  EXAM: PORTABLE CHEST - 1 VIEW  COMPARISON:  11/01/2014  FINDINGS: Right dialysis catheter is in place with the tip in the upper right atrium. No pneumothorax. There are low lung volumes with cardiomegaly and vascular congestion. No overt edema. No effusions. No acute bony abnormality.  IMPRESSION: Right dialysis catheter tip in the upper right atrium without pneumothorax. Low lung volumes with cardiomegaly and vascular congestion.   Electronically Signed   By: Rolm Baptise M.D.   On: 11/04/2014 11:17   Dg Chest Port 1 View  11/01/2014   CLINICAL DATA:  Dyspnea since yesterday.  EXAM: PORTABLE CHEST - 1 VIEW  COMPARISON:  10/30/2014.  FINDINGS: Stable enlarged cardiac silhouette. The previously demonstrated bilateral airspace opacity is almost completely resolved. No pleural fluid seen. Lower thoracic spine degenerative changes.  IMPRESSION: 1. Significantly improved bilateral alveolar edema. Pneumonia is less likely given the rapid improvement. 2. Stable cardiomegaly.   Electronically Signed   By: Claudie Revering M.D.   On: 11/01/2014 13:08   Dg Chest Port 1 View  10/30/2014   CLINICAL  DATA:  Post sepsis. Shortness of breath and cough. White sputum and fever.  EXAM: PORTABLE CHEST - 1 VIEW  COMPARISON:  03/30/2012.  FINDINGS: Mild cardiac enlargement is noted. Bilateral, upper and lower lobe airspace opacities are identified left greater than right. No pleural effusion or edema noted. The visualized osseous structures are unremarkable.  IMPRESSION: 1. Bilateral, multi focal airspace opacities compatible with pneumonia.   Electronically Signed   By: Kerby Moors M.D.   On: 10/30/2014 10:07   Dg Abd 2 Views  11/08/2014   CLINICAL DATA:  Constipation.  EXAM: ABDOMEN - 2 VIEW  COMPARISON:  None.  FINDINGS: The bowel gas pattern is normal. There is no evidence of free air. No radio-opaque calculi or other significant radiographic abnormality is seen.  IMPRESSION: Negative.   Electronically Signed   By: Kerby Moors M.D.   On: 11/08/2014 19:03   Dg Fluoro Guide Cv Line-no Report  11/04/2014   CLINICAL DATA:    FLOURO GUIDE CV LINE  Fluoroscopy was utilized by the requesting physician.  No radiographic  interpretation.     DISCHARGE EXAMINATION: Filed Vitals:  11/09/14 1730 11/09/14 2046 11/10/14 0425 11/10/14 0948  BP: 141/68 136/64 136/60 119/54  Pulse: 77 74 76 80  Temp: 98.3 F (36.8 C) 98.2 F (36.8 C) 97.9 F (36.6 C) 98.3 F (36.8 C)  TempSrc: Oral Oral Oral Oral  Resp: 18 16 18 18   Height:      Weight:      SpO2: 98% 97% 97% 98%   General appearance: alert, cooperative, appears stated age and no distress Resp: Coarse breath sounds bilaterally without any rales, rhonchi or wheezing. Cardio: regular rate and rhythm, S1, S2 normal, no murmur, click, rub or gallop GI: soft, non-tender; bowel sounds normal; no masses,  no organomegaly  DISPOSITION: Home with family  Discharge Instructions    Call MD for:  difficulty breathing, headache or visual disturbances    Complete by:  As directed      Call MD for:  persistant dizziness or light-headedness    Complete by:   As directed      Call MD for:  severe uncontrolled pain    Complete by:  As directed      Call MD for:  temperature >100.4    Complete by:  As directed      Diet - low sodium heart healthy    Complete by:  As directed      Discharge instructions    Complete by:  As directed   You have been accepted at the [facility] on a Tuesday, Thursday and Saturday 2nd shift schedule. Your next dialysis is on Saturday March 26th. You must visit the center on Friday March 25 between 1pm-3:30pm to sign paperwork and consents. You also need to set up with a PCP.      Increase activity slowly    Complete by:  As directed            ALLERGIES: No Known Allergies   Discharge Medication List as of 11/10/2014 10:43 AM    START taking these medications   Details  calcium acetate (PHOSLO) 667 MG capsule Take 1 capsule (667 mg total) by mouth 3 (three) times daily with meals., Starting 11/10/2014, Until Discontinued, Print    multivitamin (RENA-VIT) TABS tablet Take 1 tablet by mouth at bedtime., Starting 11/10/2014, Until Discontinued, Print      CONTINUE these medications which have CHANGED   Details  amLODipine (NORVASC) 5 MG tablet Take 1 tablet (5 mg total) by mouth every Monday, Wednesday, and Friday., Starting 11/10/2014, Until Discontinued, Print      STOP taking these medications     calcitRIOL (ROCALTROL) 0.25 MCG capsule      carvedilol (COREG) 6.25 MG tablet      dextromethorphan 15 MG/5ML syrup      furosemide (LASIX) 20 MG tablet      traMADol (ULTRAM) 50 MG tablet        Follow-up Information    Follow up with [name], MD In 6 weeks.   Specialty:  Vascular Surgery   Why:  sent message to office   Contact information:   [address] [place] [address] [phone]       Follow up with [facility]    .   Why:  Followup appointment arranged for Monday , November 14, 2014 @ 9am.    Contact information:   [address]  [place] [phone]      TOTAL DISCHARGE TIME: 40 mins  [facility] Pager  [phone]  11/10/2014, 1:18 PM

## 2014-11-10 NOTE — Progress Notes (Signed)
Pt discharging via wheelchair, escorted by volunteer, meeting family friend downstairs. Discharge instructions provided to patient and family friend; utilized spanish interpreter Windle Guard. Emphasized education on HD, home care, and follow up care; both verbalize understanding and are able to provide appropriate teach back. PIV discontinued, site without s/s of complication. Denies needs or questions at this time.

## 2014-11-10 NOTE — Progress Notes (Signed)
[facility] ROUNDING NOTE   Subjective:   Interval History: no complaints stable patient   Objective:  Vital signs in last 24 hours:  Temp:  [97.7 F (36.5 C)-98.3 F (36.8 C)] 98.3 F (36.8 C) (03/24 0948) Pulse Rate:  [70-81] 80 (03/24 0948) Resp:  [16-18] 18 (03/24 0948) BP: (90-176)/(36-85) 119/54 mmHg (03/24 0948) SpO2:  [97 %-98 %] 98 % (03/24 0948) Weight:  [54.1 kg (119 lb 4.3 oz)-55 kg (121 lb 4.1 oz)] 54.1 kg (119 lb 4.3 oz) (03/23 1440)  Weight change: 1.9 kg (4 lb 3 oz) Filed Weights   11/08/14 2030 11/09/14 1055 11/09/14 1440  Weight: 53.1 kg (117 lb 1 oz) 55 kg (121 lb 4.1 oz) 54.1 kg (119 lb 4.3 oz)    Intake/Output: I/O last 3 completed shifts: In: 61 [P.O.:840; I.V.:6] Out: 867 [Other:867]   Intake/Output this shift:  Total I/O In: 240 [P.O.:240] Out: 0   CVS- RRR RS- CTA ABD- BS present soft non-distended EXT- no edema   Basic Metabolic Panel:  Recent Labs Lab 11/04/14 0536 11/05/14 0430 11/06/14 0948 11/07/14 0630 11/09/14 0538  NA 141 136 139 138 136  K 3.7 4.0 3.6 3.9 4.4  CL 106 101 103 104 101  CO2 22 26 25 26 30   GLUCOSE 120* 90 189* 90 138*  BUN 87* 47* 55* 57* 40*  CREATININE 7.29* 5.61* 5.86* 5.79* 4.74*  CALCIUM 8.2* 8.0* 8.5 8.8 8.9  PHOS 4.9*  --   --   --   --     Liver Function Tests:  Recent Labs Lab 11/04/14 0536  ALBUMIN 2.1*   No results for input(s): LIPASE, AMYLASE in the last 168 hours. No results for input(s): AMMONIA in the last 168 hours.  CBC:  Recent Labs Lab 11/04/14 0536 11/05/14 0430 11/07/14 0630 11/09/14 0538  WBC 4.8 4.6 6.4 7.0  HGB 8.3* 8.5* 9.2* 9.5*  HCT 24.9* 26.1* 28.5* 30.4*  MCV 90.9 90.6 93.1 95.3  PLT 209 254 347 371    Cardiac Enzymes: No results for input(s): CKTOTAL, CKMB, CKMBINDEX, TROPONINI in the last 168 hours.  BNP: Invalid input(s): POCBNP  CBG:  Recent Labs Lab 11/09/14 0750 11/09/14 1501 11/09/14 1728 11/09/14 2049 11/10/14 0729   GLUCAP 109* 124* 267* 209* 103*    Microbiology: Results for orders placed or performed during the hospital encounter of 10/30/14  Blood Culture (routine x 2)     Status: None   Collection Time: 10/30/14  9:30 AM  Result Value Ref Range Status   Specimen Description BLOOD RIGHT FOREARM  Final   Special Requests BOTTLES DRAWN AEROBIC AND ANAEROBIC 5CC  Final   Culture   Final    NO GROWTH 5 DAYS Performed at [facility]    Report Status 11/05/2014 FINAL  Final  Blood Culture (routine x 2)     Status: None   Collection Time: 10/30/14  9:36 AM  Result Value Ref Range Status   Specimen Description BLOOD LEFT ARM  Final   Special Requests BOTTLES DRAWN AEROBIC AND ANAEROBIC 5CC  Final   Culture   Final    NO GROWTH 5 DAYS Performed at [facility]    Report Status 11/05/2014 FINAL  Final  MRSA PCR Screening     Status: None   Collection Time: 10/30/14  1:05 PM  Result Value Ref Range Status   MRSA by PCR NEGATIVE NEGATIVE Final    Comment:        The  GeneXpert MRSA Assay (FDA approved for NASAL specimens only), is one component of a comprehensive MRSA colonization surveillance program. It is not intended to diagnose MRSA infection nor to guide or monitor treatment for MRSA infections.   Urine culture     Status: None   Collection Time: 10/30/14  1:40 PM  Result Value Ref Range Status   Specimen Description URINE, CLEAN CATCH  Final   Special Requests NONE  Final   Colony Count   Final    8,000 COLONIES/ML Performed at [facility]    Culture   Final    INSIGNIFICANT GROWTH Performed at [facility]    Report Status 10/31/2014 FINAL  Final    Coagulation Studies: No results for input(s): LABPROT, INR in the last 72 hours.  Urinalysis: No results for input(s): COLORURINE, LABSPEC, PHURINE, GLUCOSEU, HGBUR, BILIRUBINUR, KETONESUR, PROTEINUR, UROBILINOGEN, NITRITE, LEUKOCYTESUR in the last 72 hours.  Invalid input(s): APPERANCEUR     Imaging: Dg Abd 2 Views  11/08/2014   CLINICAL DATA:  Constipation.  EXAM: ABDOMEN - 2 VIEW  COMPARISON:  None.  FINDINGS: The bowel gas pattern is normal. There is no evidence of free air. No radio-opaque calculi or other significant radiographic abnormality is seen.  IMPRESSION: Negative.   Electronically Signed   By: Kerby Moors M.D.   On: 11/08/2014 19:03     Medications:     . amLODipine  5 mg Oral Daily  . calcium acetate  667 mg Oral TID WC  . darbepoetin (ARANESP) injection - NON-DIALYSIS  150 mcg Subcutaneous Q Mon-1800  . heparin  5,000 Units Subcutaneous 3 times per day  . insulin aspart  0-9 Units Subcutaneous TID WC  . magnesium hydroxide  30 mL Oral Daily  . multivitamin  1 tablet Oral QHS  . polyethylene glycol  17 g Oral Daily  . senna-docusate  2 tablet Oral BID  . 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

## 2014-11-14 ENCOUNTER — Inpatient Hospital Stay: Payer: Medicaid Other

## 2014-12-09 ENCOUNTER — Other Ambulatory Visit (HOSPITAL_COMMUNITY): Payer: Medicaid Other

## 2014-12-13 ENCOUNTER — Encounter: Payer: Medicaid Other | Admitting: Vascular Surgery

## 2015-01-13 ENCOUNTER — Encounter: Payer: Self-pay | Admitting: Vascular Surgery

## 2015-01-17 ENCOUNTER — Ambulatory Visit (INDEPENDENT_AMBULATORY_CARE_PROVIDER_SITE_OTHER): Payer: Self-pay | Admitting: Vascular Surgery

## 2015-01-17 ENCOUNTER — Encounter: Payer: Self-pay | Admitting: Vascular Surgery

## 2015-01-17 ENCOUNTER — Other Ambulatory Visit: Payer: Self-pay | Admitting: Vascular Surgery

## 2015-01-17 ENCOUNTER — Ambulatory Visit (HOSPITAL_COMMUNITY)
Admission: RE | Admit: 2015-01-17 | Discharge: 2015-01-17 | Disposition: A | Discharge status: Home / Self Care | Payer: Self-pay | Source: Ambulatory Visit | Attending: Vascular Surgery | Admitting: Vascular Surgery

## 2015-01-17 ENCOUNTER — Other Ambulatory Visit: Payer: Self-pay

## 2015-01-17 ENCOUNTER — Encounter (HOSPITAL_COMMUNITY): Payer: Self-pay | Admitting: *Deleted

## 2015-01-17 VITALS — BP 173/92 | HR 78 | Temp 97.3°F | Resp 16 | Ht 63.0 in | Wt 131.0 lb

## 2015-01-17 DIAGNOSIS — N186 End stage renal disease: Secondary | ICD-10-CM

## 2015-01-17 DIAGNOSIS — Z4931 Encounter for adequacy testing for hemodialysis: Secondary | ICD-10-CM

## 2015-01-17 MED ORDER — DEXTROSE 5 % IV SOLN
1.5000 g | INTRAVENOUS | Status: AC
  Administered 2015-01-18: 1.5 g via INTRAVENOUS
  Filled 2015-01-17: qty 1.5

## 2015-01-17 MED ORDER — SODIUM CHLORIDE 0.9 % IV SOLN
INTRAVENOUS | Status: DC
  Administered 2015-01-18: 11:00:00 via INTRAVENOUS

## 2015-01-17 NOTE — Progress Notes (Signed)
Called pt via Brian Fuller, Brian Fuller. When pt answered phone, he gave it to his friend [name] and she states he wanted her to do the call. Brian Fuller, interpreter stayed on phone in case I needed his assistance. Ms. [name] was able to verify allergies, meds, medical and surgical history. I gave her pre-op instructions and she voiced understanding.

## 2015-01-17 NOTE — Progress Notes (Signed)
Subjective:     Patient ID: Brian Fuller, male   DOB: March 31, 1951, 64 y.o.   MRN: [id number]  HPI this 64 year old male has end-stage renal disease and is on dialysis Tuesday Thursday and Saturday. I created a left radial cephalic AV fistula on [id number] area he returns today for first follow-up. He denies any pain or numbness in the left hand. He is seen today with an interpreter and a family member. He is being dialyzed through a tunneled catheter in his right IJ.  Past Medical History  Diagnosis Date  . Diabetes mellitus without complication   . Glaucoma   . Anemia   . Chronic kidney disease     History  Substance Use Topics  . Smoking status: Never Smoker   . Smokeless tobacco: Not on file  . Alcohol Use: No    Family History  Problem Relation Age of Onset  . Diabetes Mother   . Diabetes Brother   . Diabetes Father   . Hypertension Sister     No Known Allergies   Current outpatient prescriptions:  .  amLODipine (NORVASC) 5 MG tablet, Take 1 tablet (5 mg total) by mouth every Monday, Wednesday, and Friday., Disp: 30 tablet, Rfl: 0 .  calcium acetate (PHOSLO) 667 MG capsule, Take 1 capsule (667 mg total) by mouth 3 (three) times daily with meals., Disp: 90 capsule, Rfl: 0 .  multivitamin (RENA-VIT) TABS tablet, Take 1 tablet by mouth at bedtime., Disp: 30 tablet, Rfl: 0  Filed Vitals:   01/17/15 1004  BP: 173/92  Pulse: 78  Temp: 97.3 F (36.3 C)  TempSrc: Oral  Resp: 16  Height: 5\' 3"  (1.6 m)  Weight: 131 lb (59.421 kg)  SpO2: 99%    Body mass index is 23.21 kg/(m^2).         Review of Systems unremarkable-denies chest pain or dyspnea on exertion     Objective:   Physical Exam BP 173/92 mmHg  Pulse 78  Temp(Src) 97.3 F (36.3 C) (Oral)  Resp 16  Ht 5\' 3"  (1.6 m)  Wt 131 lb (59.421 kg)  BMI 23.21 kg/m2  SpO2 99%  Gen.-alert and oriented x3 in no apparent distress HEENT normal for age Lungs no rhonchi or wheezing Cardiovascular  regular rhythm no murmurs carotid pulses 3+ palpable no bruits audible Abdomen soft nontender no palpable masses Musculoskeletal free of  major deformities Skin clear -no rashes Neurologic normal Lower extremities 3+ femoral and dorsalis pedis pulses palpable bilaterally with no edema Left upper extremity with functioning radial-cephalic AV fistula with good pulse and thrill up to antecubital area. Left hand well perfused.  Today I ordered a duplex scan of the left radial-cephalic AV fistula. It is of excellent caliber except for a 1 inch segment near the arterial anastomosis which is only 0.13 cm in diameter compared to 0.42 cm in other areas. There is also a velocity of 904 cm/s.       Assessment:     Left radial-cephalic AV fistula recently created by me with short stenotic segment near radial arterial anastomosis    Plan:     Plan revision of left radial-cephalic AV fistula with patch angioplasty tomorrow. Questions answered via an interpreter and patient would like to proceed

## 2015-01-18 ENCOUNTER — Encounter (HOSPITAL_COMMUNITY)
Admission: RE | Disposition: A | Discharge status: Home / Self Care | Payer: Self-pay | Source: Ambulatory Visit | Attending: Vascular Surgery

## 2015-01-18 ENCOUNTER — Ambulatory Visit (HOSPITAL_COMMUNITY)
Admission: RE | Admit: 2015-01-18 | Discharge: 2015-01-18 | Disposition: A | Discharge status: Home / Self Care | Payer: Self-pay | Source: Ambulatory Visit | Attending: Vascular Surgery | Admitting: Vascular Surgery

## 2015-01-18 ENCOUNTER — Encounter (HOSPITAL_COMMUNITY): Payer: Self-pay | Admitting: *Deleted

## 2015-01-18 ENCOUNTER — Ambulatory Visit (HOSPITAL_COMMUNITY): Payer: Self-pay | Admitting: Critical Care Medicine

## 2015-01-18 DIAGNOSIS — Z79899 Other long term (current) drug therapy: Secondary | ICD-10-CM | POA: Insufficient documentation

## 2015-01-18 DIAGNOSIS — I120 Hypertensive chronic kidney disease with stage 5 chronic kidney disease or end stage renal disease: Secondary | ICD-10-CM | POA: Insufficient documentation

## 2015-01-18 DIAGNOSIS — Y832 Surgical operation with anastomosis, bypass or graft as the cause of abnormal reaction of the patient, or of later complication, without mention of misadventure at the time of the procedure: Secondary | ICD-10-CM | POA: Insufficient documentation

## 2015-01-18 DIAGNOSIS — Z992 Dependence on renal dialysis: Secondary | ICD-10-CM | POA: Insufficient documentation

## 2015-01-18 DIAGNOSIS — N186 End stage renal disease: Secondary | ICD-10-CM | POA: Insufficient documentation

## 2015-01-18 DIAGNOSIS — Z87891 Personal history of nicotine dependence: Secondary | ICD-10-CM | POA: Insufficient documentation

## 2015-01-18 DIAGNOSIS — E119 Type 2 diabetes mellitus without complications: Secondary | ICD-10-CM | POA: Insufficient documentation

## 2015-01-18 DIAGNOSIS — Y929 Unspecified place or not applicable: Secondary | ICD-10-CM | POA: Insufficient documentation

## 2015-01-18 DIAGNOSIS — T82858A Stenosis of vascular prosthetic devices, implants and grafts, initial encounter: Secondary | ICD-10-CM | POA: Insufficient documentation

## 2015-01-18 DIAGNOSIS — T82898A Other specified complication of vascular prosthetic devices, implants and grafts, initial encounter: Secondary | ICD-10-CM

## 2015-01-18 DIAGNOSIS — H409 Unspecified glaucoma: Secondary | ICD-10-CM | POA: Insufficient documentation

## 2015-01-18 HISTORY — DX: Type 2 diabetes mellitus with unspecified diabetic retinopathy without macular edema: E11.319

## 2015-01-18 HISTORY — PX: REVISON OF ARTERIOVENOUS FISTULA: SHX6074

## 2015-01-18 HISTORY — DX: Essential (primary) hypertension: I10

## 2015-01-18 LAB — POCT I-STAT 4, (NA,K, GLUC, HGB,HCT)
Glucose, Bld: 116 mg/dL — ABNORMAL HIGH (ref 65–99)
HCT: 40 % (ref 39.0–52.0)
HEMOGLOBIN: 13.6 g/dL (ref 13.0–17.0)
Potassium: 3.9 mmol/L (ref 3.5–5.1)
Sodium: 135 mmol/L (ref 135–145)

## 2015-01-18 LAB — GLUCOSE, CAPILLARY: Glucose-Capillary: 86 mg/dL (ref 65–99)

## 2015-01-18 SURGERY — REVISON OF ARTERIOVENOUS FISTULA
Anesthesia: General | Site: Arm Lower | Laterality: Left

## 2015-01-18 MED ORDER — 0.9 % SODIUM CHLORIDE (POUR BTL) OPTIME
TOPICAL | Status: DC | PRN
  Administered 2015-01-18: 1000 mL

## 2015-01-18 MED ORDER — FENTANYL CITRATE (PF) 100 MCG/2ML IJ SOLN
INTRAMUSCULAR | Status: DC | PRN
  Administered 2015-01-18: 50 ug via INTRAVENOUS

## 2015-01-18 MED ORDER — CHLORHEXIDINE GLUCONATE CLOTH 2 % EX PADS: 6.0000 | MEDICATED_PAD | Freq: Once | CUTANEOUS | Status: DC

## 2015-01-18 MED ORDER — ONDANSETRON HCL 4 MG/2ML IJ SOLN
INTRAMUSCULAR | Status: DC | PRN
Start: 2015-01-18 — End: 2015-01-18
  Administered 2015-01-18: 4 mg via INTRAVENOUS

## 2015-01-18 MED ORDER — MIDAZOLAM HCL 5 MG/5ML IJ SOLN
INTRAMUSCULAR | Status: DC | PRN
  Administered 2015-01-18: 2 mg via INTRAVENOUS

## 2015-01-18 MED ORDER — LIDOCAINE-EPINEPHRINE (PF) 1 %-1:200000 IJ SOLN
INTRAMUSCULAR | Status: AC
  Filled 2015-01-18: qty 10

## 2015-01-18 MED ORDER — PHENYLEPHRINE 40 MCG/ML (10ML) SYRINGE FOR IV PUSH (FOR BLOOD PRESSURE SUPPORT)
PREFILLED_SYRINGE | INTRAVENOUS | Status: AC
  Filled 2015-01-18: qty 10

## 2015-01-18 MED ORDER — PROPOFOL 10 MG/ML IV BOLUS
INTRAVENOUS | Status: AC
  Filled 2015-01-18: qty 20

## 2015-01-18 MED ORDER — FENTANYL CITRATE (PF) 250 MCG/5ML IJ SOLN
INTRAMUSCULAR | Status: AC
  Filled 2015-01-18: qty 5

## 2015-01-18 MED ORDER — OXYCODONE-ACETAMINOPHEN 5-325 MG PO TABS: 1.0000 | ORAL_TABLET | Freq: Four times a day (QID) | ORAL | Status: DC | PRN

## 2015-01-18 MED ORDER — MIDAZOLAM HCL 2 MG/2ML IJ SOLN
INTRAMUSCULAR | Status: AC
  Filled 2015-01-18: qty 2

## 2015-01-18 MED ORDER — ONDANSETRON HCL 4 MG/2ML IJ SOLN
INTRAMUSCULAR | Status: AC
  Filled 2015-01-18: qty 2

## 2015-01-18 MED ORDER — SODIUM CHLORIDE 0.9 % IR SOLN
Status: DC | PRN
  Administered 2015-01-18: 14:00:00

## 2015-01-18 MED ORDER — PHENYLEPHRINE HCL 10 MG/ML IJ SOLN
INTRAMUSCULAR | Status: DC | PRN
  Administered 2015-01-18: 40 ug via INTRAVENOUS
  Administered 2015-01-18 (×4): 80 ug via INTRAVENOUS
  Administered 2015-01-18: 40 ug via INTRAVENOUS

## 2015-01-18 MED ORDER — LIDOCAINE-EPINEPHRINE (PF) 1 %-1:200000 IJ SOLN
INTRAMUSCULAR | Status: DC | PRN
  Administered 2015-01-18: 30 mL

## 2015-01-18 MED ORDER — PROPOFOL INFUSION 10 MG/ML OPTIME
INTRAVENOUS | Status: DC | PRN
  Administered 2015-01-18: 50 ug/kg/min via INTRAVENOUS

## 2015-01-18 SURGICAL SUPPLY — 30 items
CANISTER SUCTION 2500CC (MISCELLANEOUS) ×3 IMPLANT
CLIP TI MEDIUM 6 (CLIP) ×3 IMPLANT
CLIP TI WIDE RED SMALL 6 (CLIP) ×3 IMPLANT
ELECT REM PT RETURN 9FT ADLT (ELECTROSURGICAL) ×3
ELECTRODE REM PT RTRN 9FT ADLT (ELECTROSURGICAL) ×1 IMPLANT
GAUZE SPONGE 4X4 12PLY STRL (GAUZE/BANDAGES/DRESSINGS) ×3 IMPLANT
GLOVE BIOGEL PI IND STRL 6 (GLOVE) ×1 IMPLANT
GLOVE BIOGEL PI IND STRL 6.5 (GLOVE) ×2 IMPLANT
GLOVE BIOGEL PI IND STRL 7.0 (GLOVE) ×1 IMPLANT
GLOVE BIOGEL PI INDICATOR 6 (GLOVE) ×2
GLOVE BIOGEL PI INDICATOR 6.5 (GLOVE) ×4
GLOVE BIOGEL PI INDICATOR 7.0 (GLOVE) ×2
GLOVE ECLIPSE 6.5 STRL STRAW (GLOVE) ×3 IMPLANT
GLOVE SS BIOGEL STRL SZ 7 (GLOVE) ×1 IMPLANT
GLOVE SUPERSENSE BIOGEL SZ 7 (GLOVE) ×2
GLOVE SURG SS PI 6.0 STRL IVOR (GLOVE) ×3 IMPLANT
GOWN STRL REUS W/ TWL LRG LVL3 (GOWN DISPOSABLE) ×3 IMPLANT
GOWN STRL REUS W/TWL LRG LVL3 (GOWN DISPOSABLE) ×6
KIT BASIN OR (CUSTOM PROCEDURE TRAY) ×3 IMPLANT
KIT ROOM TURNOVER OR (KITS) ×3 IMPLANT
LIQUID BAND (GAUZE/BANDAGES/DRESSINGS) ×3 IMPLANT
NS IRRIG 1000ML POUR BTL (IV SOLUTION) ×3 IMPLANT
PACK CV ACCESS (CUSTOM PROCEDURE TRAY) ×3 IMPLANT
PAD ARMBOARD 7.5X6 YLW CONV (MISCELLANEOUS) ×6 IMPLANT
PATCH VASCULAR VASCU GUARD 1X6 (Vascular Products) ×3 IMPLANT
SUT PROLENE 6 0 BV (SUTURE) ×3 IMPLANT
SUT VIC AB 3-0 SH 27 (SUTURE) ×2
SUT VIC AB 3-0 SH 27X BRD (SUTURE) ×1 IMPLANT
UNDERPAD 30X30 INCONTINENT (UNDERPADS AND DIAPERS) ×3 IMPLANT
WATER STERILE IRR 1000ML POUR (IV SOLUTION) ×3 IMPLANT

## 2015-01-18 NOTE — Interval H&P Note (Signed)
History and Physical Interval Note:  01/18/2015 12:49 PM  Brian Fuller  has presented today for surgery, with the diagnosis of End Stage Renal Disease N18.6  The various methods of treatment have been discussed with the patient and family. After consideration of risks, benefits and other options for treatment, the patient has consented to  Procedure(s): REVISON OF LEFT RADIOCEPHALIC ARTERIOVENOUS FISTULA (Left) as a surgical intervention .  The patient's history has been reviewed, patient examined, no change in status, stable for surgery.  I have reviewed the patient's chart and labs.  Questions were answered to the patient's satisfaction.     [name]

## 2015-01-18 NOTE — Progress Notes (Signed)
Pt's friend and preferred interpreter Nevada Crane present and assisted with interpretation during pre-op.  Signed interpreter release form, placed in chart.

## 2015-01-18 NOTE — Anesthesia Procedure Notes (Signed)
Procedure Name: MAC Date/Time: 01/18/2015 1:28 PM Performed by: Merrilyn Puma B Pre-anesthesia Checklist: Patient identified, Timeout performed, Emergency Drugs available, Suction available and Patient being monitored Patient Re-evaluated:Patient Re-evaluated prior to inductionOxygen Delivery Method: Simple face mask Intubation Type: IV induction Placement Confirmation: positive ETCO2 and breath sounds checked- equal and bilateral Dental Injury: Teeth and Oropharynx as per pre-operative assessment

## 2015-01-18 NOTE — Op Note (Signed)
OPERATIVE REPORT  Date of Surgery: 01/18/2015  Surgeon: [name], MD  Assistant: Nurse  Pre-op Diagnosis: End Stage Renal Disease N18.6; Slowely Maturing AV Fistula-left radial-cephalic due to stenotic segment of vein near arterial anastomosis  Post-op Diagnosis: Same  Procedure: Procedure(s): REVISON OF LEFT RADIOCEPHALIC ARTERIOVENOUS FISTULA USING VASCU-GUARD PERIPHERAL VASCULAR PATCH   Anesthesia: Mac  EBL: Minimal  Complications: None  Procedure Details: The patient was taken to the operating room placed in supine position at which time the left upper extremity was prepped with Betadine scrub and solution draped in routine sterile manner. After infiltration with 1% Xylocaine with epinephrine a longitudinal incision was made in the distal forearm through the previous scar where the radial cephalic fistula had been created and extended slightly proximally. Cephalic vein was dissected free oxalate where it was of good caliber 4 mm in size. It was dissected distally toward the arterial anastomosis where it was circumferentially stenotic over about a 2 cm segment. Proximal and distal control of the radial artery was obtained and there were encircled with Vesseloops. After completely dissecting the anastomosis free the radial artery was occluded proximally and distally with vessel loops and the vein was opened on the hood of the anastomosis to the radial artery. The anastomosis itself was widely patent but the vein adjacent to this had about a 2 cm segment of sclerotic stenosis at a valve site. R Barbaraann Rondo was extended through this proximally and distally. The vein was except a 4 mm dilator proximally and there was excellent inflow from the radial artery. A bovine Vascu-Guard guard patch was then selected and rinsed appropriately and fashioned in an oval configuration. It was sewn into place with 60 proline. Prior to completion of this appropriate flushing was performed proximally and distally  when this was completed there was Pulse and thrill much improved in the forearm fistula. Adequate hemostasis was achieved. No heparin was utilized during the procedure. Wound was closed in layers in a Subcuticular fashion with Dermabond patient taken to recovery room in satisfactory condition   [name], MD 01/18/2015 2:32 PM

## 2015-01-18 NOTE — Transfer of Care (Signed)
Immediate Anesthesia Transfer of Care Note  Patient: Brian Fuller  Procedure(s) Performed: Procedure(s): REVISON OF LEFT RADIOCEPHALIC ARTERIOVENOUS FISTULA USING VASCU-GUARD PERIPHERAL VASCULAR PATCH  (Left)  Patient Location: PACU  Anesthesia Type:General  Level of Consciousness: awake and alert   Airway & Oxygen Therapy: Patient Spontanous Breathing  Post-op Assessment: Report given to RN  Post vital signs: Reviewed and stable  Last Vitals:  Filed Vitals:   01/18/15 1434  BP:   Pulse:   Temp: 36.5 C  Resp:     Complications: No apparent anesthesia complications

## 2015-01-18 NOTE — Anesthesia Preprocedure Evaluation (Addendum)
Anesthesia Evaluation  Patient identified by MRN, date of birth, ID band Patient awake    Reviewed: Allergy & Precautions, NPO status , Patient's Chart, lab work & pertinent test results  Airway Mallampati: I       Dental  (+) Dental Advisory Given   Pulmonary former smoker,    Pulmonary exam normal       Cardiovascular hypertension, Pt. on medications Normal cardiovascular exam    Neuro/Psych    GI/Hepatic   Endo/Other  diabetes, Type 2  Renal/GU Dialysis and ESRFRenal disease     Musculoskeletal   Abdominal   Peds  Hematology  (+) anemia ,   Anesthesia Other Findings   Reproductive/Obstetrics                            Anesthesia Physical Anesthesia Plan  ASA: III  Anesthesia Plan: General   Post-op Pain Management:    Induction: Intravenous  Airway Management Planned: LMA  Additional Equipment:   Intra-op Plan:   Post-operative Plan: Extubation in OR  Informed Consent: I have reviewed the patients History and Physical, chart, labs and discussed the procedure including the risks, benefits and alternatives for the proposed anesthesia with the patient or authorized representative who has indicated his/her understanding and acceptance.     Plan Discussed with: CRNA, Anesthesiologist and Surgeon  Anesthesia Plan Comments:         Anesthesia Quick Evaluation

## 2015-01-18 NOTE — H&P (View-Only) (Signed)
Subjective:     Patient ID: Brian Fuller, male   DOB: [birth year], 64 y.o.   MRN: [id number]  HPI this 64 year old male has end-stage renal disease and is on dialysis Tuesday Thursday and Saturday. I created a left radial cephalic AV fistula on [id number] area he returns today for first follow-up. He denies any pain or numbness in the left hand. He is seen today with an interpreter and a family member. He is being dialyzed through a tunneled catheter in his right IJ.  Past Medical History  Diagnosis Date  . Diabetes mellitus without complication   . Glaucoma   . Anemia   . Chronic kidney disease     History  Substance Use Topics  . Smoking status: Never Smoker   . Smokeless tobacco: Not on file  . Alcohol Use: No    Family History  Problem Relation Age of Onset  . Diabetes Mother   . Diabetes Brother   . Diabetes Father   . Hypertension Sister     No Known Allergies   Current outpatient prescriptions:  .  amLODipine (NORVASC) 5 MG tablet, Take 1 tablet (5 mg total) by mouth every Monday, Wednesday, and Friday., Disp: 30 tablet, Rfl: 0 .  calcium acetate (PHOSLO) 667 MG capsule, Take 1 capsule (667 mg total) by mouth 3 (three) times daily with meals., Disp: 90 capsule, Rfl: 0 .  multivitamin (RENA-VIT) TABS tablet, Take 1 tablet by mouth at bedtime., Disp: 30 tablet, Rfl: 0  Filed Vitals:   01/17/15 1004  BP: 173/92  Pulse: 78  Temp: 97.3 F (36.3 C)  TempSrc: Oral  Resp: 16  Height: 5\' 3"  (1.6 m)  Weight: 131 lb (59.421 kg)  SpO2: 99%    Body mass index is 23.21 kg/(m^2).         Review of Systems unremarkable-denies chest pain or dyspnea on exertion     Objective:   Physical Exam BP 173/92 mmHg  Pulse 78  Temp(Src) 97.3 F (36.3 C) (Oral)  Resp 16  Ht 5\' 3"  (1.6 m)  Wt 131 lb (59.421 kg)  BMI 23.21 kg/m2  SpO2 99%  Gen.-alert and oriented x3 in no apparent distress HEENT normal for age Lungs no rhonchi or wheezing Cardiovascular  regular rhythm no murmurs carotid pulses 3+ palpable no bruits audible Abdomen soft nontender no palpable masses Musculoskeletal free of  major deformities Skin clear -no rashes Neurologic normal Lower extremities 3+ femoral and dorsalis pedis pulses palpable bilaterally with no edema Left upper extremity with functioning radial-cephalic AV fistula with good pulse and thrill up to antecubital area. Left hand well perfused.  Today I ordered a duplex scan of the left radial-cephalic AV fistula. It is of excellent caliber except for a 1 inch segment near the arterial anastomosis which is only 0.13 cm in diameter compared to 0.42 cm in other areas. There is also a velocity of 904 cm/s.       Assessment:     Left radial-cephalic AV fistula recently created by me with short stenotic segment near radial arterial anastomosis    Plan:     Plan revision of left radial-cephalic AV fistula with patch angioplasty tomorrow. Questions answered via an interpreter and patient would like to proceed

## 2015-01-18 NOTE — Anesthesia Postprocedure Evaluation (Signed)
  Anesthesia Post-op Note  Patient: Brian Fuller  Procedure(s) Performed: Procedure(s): REVISON OF LEFT RADIOCEPHALIC ARTERIOVENOUS FISTULA USING VASCU-GUARD PERIPHERAL VASCULAR PATCH  (Left)  Patient Location: PACU  Anesthesia Type:General  Level of Consciousness: awake, alert , oriented and patient cooperative  Airway and Oxygen Therapy: Patient Spontanous Breathing  Post-op Pain: none  Post-op Assessment: Post-op Vital signs reviewed, Patient's Cardiovascular Status Stable, Respiratory Function Stable, Patent Airway, No signs of Nausea or vomiting and Pain level controlled  Post-op Vital Signs: Reviewed and stable  Last Vitals:  Filed Vitals:   01/18/15 1515  BP:   Pulse:   Temp: 36.3 C  Resp:     Complications: No apparent anesthesia complications

## 2015-01-19 ENCOUNTER — Encounter (HOSPITAL_COMMUNITY): Payer: Self-pay | Admitting: Vascular Surgery

## 2015-02-08 ENCOUNTER — Ambulatory Visit: Payer: Self-pay | Attending: Internal Medicine | Admitting: Internal Medicine

## 2015-02-08 ENCOUNTER — Encounter: Payer: Self-pay | Admitting: Internal Medicine

## 2015-02-08 VITALS — BP 120/60 | HR 92 | Temp 97.8°F | Resp 16 | Wt 129.8 lb

## 2015-02-08 DIAGNOSIS — E139 Other specified diabetes mellitus without complications: Secondary | ICD-10-CM

## 2015-02-08 DIAGNOSIS — Z0000 Encounter for general adult medical examination without abnormal findings: Secondary | ICD-10-CM | POA: Insufficient documentation

## 2015-02-08 DIAGNOSIS — I10 Essential (primary) hypertension: Secondary | ICD-10-CM

## 2015-02-08 DIAGNOSIS — H538 Other visual disturbances: Secondary | ICD-10-CM | POA: Insufficient documentation

## 2015-02-08 DIAGNOSIS — E1140 Type 2 diabetes mellitus with diabetic neuropathy, unspecified: Secondary | ICD-10-CM | POA: Insufficient documentation

## 2015-02-08 DIAGNOSIS — Z992 Dependence on renal dialysis: Secondary | ICD-10-CM | POA: Insufficient documentation

## 2015-02-08 DIAGNOSIS — E11319 Type 2 diabetes mellitus with unspecified diabetic retinopathy without macular edema: Secondary | ICD-10-CM | POA: Insufficient documentation

## 2015-02-08 DIAGNOSIS — Z9114 Patient's other noncompliance with medication regimen: Secondary | ICD-10-CM | POA: Insufficient documentation

## 2015-02-08 DIAGNOSIS — N184 Chronic kidney disease, stage 4 (severe): Secondary | ICD-10-CM | POA: Insufficient documentation

## 2015-02-08 DIAGNOSIS — I129 Hypertensive chronic kidney disease with stage 1 through stage 4 chronic kidney disease, or unspecified chronic kidney disease: Secondary | ICD-10-CM | POA: Insufficient documentation

## 2015-02-08 DIAGNOSIS — Z87891 Personal history of nicotine dependence: Secondary | ICD-10-CM | POA: Insufficient documentation

## 2015-02-08 LAB — POCT GLYCOSYLATED HEMOGLOBIN (HGB A1C): Hemoglobin A1C: 10.8

## 2015-02-08 MED ORDER — INSULIN ASPART PROT & ASPART (70-30 MIX) 100 UNIT/ML PEN: 6.0000 [IU] | PEN_INJECTOR | Freq: Two times a day (BID) | SUBCUTANEOUS | Status: DC

## 2015-02-08 NOTE — Patient Instructions (Addendum)
Plan de alimentacin DASH (DASH Eating Plan) DASH es la sigla en ingls de "Enfoques Alimentarios para Detener la Hipertensin". El plan de alimentacin DASH ha demostrado bajar la presin arterial elevada (hipertensin). Los beneficios adicionales para la salud pueden incluir la disminucin del riesgo de diabetes mellitus tipo2, enfermedades cardacas e ictus. Este plan tambin puede ayudar a Horticulturist, commercial. QU DEBO SABER ACERCA DEL PLAN DE ALIMENTACIN DASH? Para el plan de alimentacin DASH, seguir las siguientes pautas generales:  Elija los alimentos con un valor porcentual diario de sodio de menos del 5% (segn figura en la etiqueta del alimento).  Use hierbas o aderezos sin sal, en lugar de sal de mesa o sal marina.  Consulte al mdico o farmacutico antes de usar sustitutos de la sal.  Coma productos con bajo contenido de sodio, cuya etiqueta suele decir "bajo contenido de sodio" o "sin agregado de sal".  Coma alimentos frescos.  Coma ms verduras, frutas y productos lcteos con bajo contenido de [place].  Elija los cereales integrales. Busque la palabra "integral" en Equities trader de la lista de ingredientes.  Elija el pescado y el pollo o el pavo sin piel ms a menudo que las carnes rojas. Limite el consumo de pescado, carne de ave y carne a 6onzas (170g) por Training and development officer.  Limite el consumo de dulces, postres, azcares y bebidas azucaradas.  Elija las grasas saludables para el corazn.  Limite el consumo de queso a 1onza (28g) por Training and development officer.  Consuma ms comida casera y menos de restaurante, de buf y comida rpida.  Limite el consumo de alimentos fritos.  Cocine los alimentos utilizando mtodos que no sean la fritura.  Limite las verduras enlatadas. Si las consume, enjuguelas bien para disminuir el sodio.  Cuando coma en un restaurante, pida que preparen su comida con menos sal o, en lo posible, sin nada de sal. QU ALIMENTOS PUEDO COMER? Pida ayuda a un nutricionista para  conocer las necesidades calricas individuales. Cereales Pan de salvado o integral. Arroz integral. Pastas de salvado o integrales. Quinua, trigo burgol y cereales integrales. Cereales con bajo contenido de sodio. Tortillas de harina de maz o de salvado. Pan de maz integral. Galletas saladas integrales. Galletas con bajo contenido de [place]. Vegetales Verduras frescas o congeladas (crudas, al vapor, asadas o grilladas). Jugos de tomate y verduras con contenido bajo o reducido de sodio. Pasta y salsa de tomate con contenido bajo o [place]. Verduras enlatadas con bajo contenido de sodio o reducido de sodio.  Lambert Mody Lambert Mody frescas, en conserva (en su jugo natural) o frutas congeladas. Carnes y otros productos con protenas Carne de res molida (al 85% o ms [place]), carne de res de animales alimentados con pastos o carne de res sin la grasa. Pollo o pavo sin piel. Carne de pollo o de [place]. Cerdo sin la grasa. Todos los pescados y frutos de mar. Huevos. Porotos, guisantes o lentejas secos. Frutos secos y semillas sin sal. Frijoles enlatados sin sal. Lcteos Productos lcteos con bajo contenido de grasas, como [place] o al 1%, quesos reducidos en grasas o al 2%, ricota con bajo contenido de grasas o [employer], o yogur natural con bajo contenido de [place]. Quesos con contenido bajo o reducido de sodio. Grasas y Naval architect en barra que no contengan grasas trans. [place] y alios para ensaladas livianos o reducidos en grasas (reducidos en sodio). [place]. Aceites de crtamo, oliva o canola. [place] natural de man o almendra. Otros Palomitas de maz y pretzels sin sal.  Los artculos mencionados arriba pueden no ser [employer] de las bebidas o los alimentos recomendados. Comunquese con el nutricionista para conocer ms opciones. QU ALIMENTOS NO SE RECOMIENDAN? Cereales Pan blanco. Pastas blancas. Arroz blanco. Pan de maz refinado. Bagels y  croissants. Galletas saladas que contengan grasas trans. Vegetales Vegetales con crema o fritos. Verduras en [place]. Verduras enlatadas comunes. Pasta y salsa de tomate en lata comunes. Jugos comunes de tomate y de verduras. Lambert Mody Frutas secas. Fruta enlatada en almbar liviano o espeso. Jugo de frutas. Carnes y otros productos con protenas [place] de carne con Lobbyist. Costillas, alas de pollo, tocineta, salchicha, mortadela, salame, chinchulines, tocino, perros calientes, salchichas alemanas y embutidos envasados. Frutos secos y semillas con sal. Frijoles con sal en lata. Lcteos L[place]he entera o al 2%, crema, mezcla de [place] y crema, y queso crema. Yogur entero o endulzado. Quesos o queso azul con alto contenido de Physicist, medical. [place]rem[place] no lcteas y coberturas batidas. Quesos procesados, quesos para untar o cuajadas. Condimentos Sal de cebolla y ajo, sal condimentada, sal de mesa y sal marina. Salsas en lata y envasadas. Salsa [place]. Salsa trtara. Salsa barbacoa. Salsa teriyaki. Salsa de soja, incluso la que tiene contenido reducido de [place]. Salsa de carne. Salsa de pescado. Salsa de [place]. Salsa rosada. Rbano picante. Ketchup y mostaza. Saborizantes y tiernizantes para carne. Caldo en cubitos. Salsa picante. Salsa tabasco. Adobos. Aderezos para tacos. Salsas. Grasas y aceites [place], [place] en barra, [place] de [place], [place], [place] clarificada y Wendee Copp de tocino. Aceites de coco, de palmiste o de palma. Aderezos comunes para ensalada. Otros Pickles y [place]. Palomitas de maz y pretzels con sal. Los artculos mencionados arriba pueden no ser [employer] de las bebidas y los alimentos que se Higher education careers adviser. Comunquese con el nutricionista para obtener ms informacin. DNDE Dolan Amen MS INFORMACIN? [place], del Pulmn y de la Sangre (National Heart, Lung, and [place]):  [email or web address] Document Released: 07/25/2011 Document Revised: 12/20/2013 [facility] Patient Information 2015 [place], [place]. This information is not intended to replace advice given to you by your health care provider. Make sure you discuss any questions you have with your health care provider.  La diabetes mellitus y los alimentos (Diabetes Mellitus and Food) Es importante que controle su nivel de azcar en la sangre (glucosa). El nivel de glucosa en sangre depende en gran medida de lo que usted come. Comer alimentos saludables en las cantidades [place] a lo largo del Training and development officer, aproximadamente a la misma hora [employer], lo ayudar a Chief Technology Officer su nivel de Multimedia programmer. Tambin puede ayudarlo a retrasar o Patent attorney de la diabetes mellitus. Comer de [employer] saludable incluso puede ayudarlo a Chartered loss adjuster de presin arterial y a Science writer o Theatre manager un peso saludable.  CMO PUEDEN AFECTARME LOS ALIMENTOS? Carbohidratos Los carbohidratos afectan el nivel de glucosa en sangre ms que cualquier otro tipo de alimento. El nutricionista lo ayudar a Teacher, adult education cuntos carbohidratos puede consumir en cada comida y ensearle a contarlos. El recuento de carbohidratos es importante para mantener la glucosa en sangre en un nivel saludable, en especial si utiliza insulina o toma determinados medicamentos para la diabetes mellitus. Alcohol El alcohol puede provocar disminuciones sbitas de la glucosa en sangre (hipoglucemia), en especial si utiliza insulina o toma determinados medicamentos para la diabetes mellitus. La hipoglucemia es una afeccin que puede poner en peligro la vida. Los sntomas de la hipoglucemia (somnolencia, mareos y Data processing manager) son similares a los sntomas  de haber consumido mucho alcohol.  Si el mdico lo autoriza a beber alcohol, hgalo con moderacin y siga estas pautas:  Las mujeres no deben beber ms de un trago por da, y los  hombres no deben beber ms de dos tragos por Training and development officer. Un trago es igual a:  12 onzas (355 ml) de cerveza  5 onzas de vino (150 ml) de vino  1,5onzas (2ml) de bebidas espirituosas  No beba con el estmago vaco.  Mantngase hidratado. Beba agua, gaseosas dietticas o t helado sin azcar.  Las gaseosas comunes, los jugos y otros refrescos podran contener muchos carbohidratos y se Civil Service fast streamer. QU ALIMENTOS NO SE RECOMIENDAN? Cuando haga las elecciones de alimentos, es importante que recuerde que todos los alimentos son distintos. Algunos tienen menos nutrientes que otros por porcin, aunque podran tener la misma cantidad de caloras o carbohidratos. Es difcil darle al cuerpo lo que necesita cuando consume alimentos con menos nutrientes. Estos son algunos ejemplos de alimentos que debera evitar ya que contienen muchas caloras y carbohidratos, pero pocos nutrientes:  Physicist, medical trans (la mayora de los alimentos procesados incluyen grasas trans en la etiqueta de Informacin nutricional).  Gaseosas comunes.  Jugos.  Caramelos.  Dulces, como tortas, pasteles, rosquillas y [place].  Comidas fritas. QU ALIMENTOS PUEDO COMER? Consuma alimentos ricos en nutrientes, que nutrirn el cuerpo y lo mantendrn saludable. Los alimentos que debe comer tambin dependern de varios factores, como:  Las caloras que necesita.  Los medicamentos que toma.  Su peso.  El nivel de glucosa en [place].  El [place] de presin arterial.  El nivel de colesterol. Tambin debe consumir una variedad de [place], como:  Protenas, como carne, aves, pescado, tofu, frutos secos y semillas (las protenas de [place] magros son mejores).  Lambert Mody.  Verduras.  Productos lcteos, como [place], queso y yogur (descremados son mejores).  Panes, granos, pastas, cereales, arroz y frijoles.  Grasas, como aceite de [place], [place] sin grasas trans, aceite de canola, aguacate y [place]. TODOS LOS QUE  PADECEN DIABETES MELLITUS TIENEN EL [place] PLAN DE [place]? Dado que todas las personas que padecen diabetes mellitus son distintas, no hay un solo plan de comidas que funcione para todos. Es muy importante que se rena con un nutricionista que lo ayudar a crear un plan de comidas adecuado para usted. Document Released: 11/12/2007 Document Revised: 08/10/2013 [facility] Patient Information 2015 [place]. This information is not intended to replace advice given to you by your health care provider. Make sure you discuss any questions you have with your health care provider.

## 2015-02-08 NOTE — Progress Notes (Signed)
Patient Demographics  Brian Fuller, is a 64 y.o. male  [id number]  [id number]  DOB - 1950-12-10  CC:  Chief Complaint  Patient presents with  . Annual Exam       HPI: Brian Fuller is a 64 y.o. male here today for annual physical examination.patient has history of diabetes currently not taking any medication, for the last 3 months he has been on dialysis every Tuesday Thursday Saturday, he has been taking his blood pressure medication amlodipine, his pressure manual is 120/60, patient does report blurry vision and is requesting referral to see an ophthalmologist, 3 months ago his hemoglobin A1c was 6.2% which has trended up to 10.8%, I have advised patient for diabetes meal planning and at this point he needs to be on insulin. Patient has No headache, No chest pain, No abdominal pain - No Nausea, No new weakness tingling or numbness, No Cough - SOB.  No Known Allergies Past Medical History  Diagnosis Date  . Diabetes mellitus without complication   . Glaucoma   . Anemia   . Hypertension   . Chronic kidney disease     dialysis, T/Th/Sat  . Diabetic retinopathy    Current Outpatient Prescriptions on File Prior to Visit  Medication Sig Dispense Refill  . amLODipine (NORVASC) 5 MG tablet Take 1 tablet (5 mg total) by mouth every Monday, Wednesday, and Friday. 30 tablet 0  . calcium acetate (PHOSLO) 667 MG capsule Take 1 capsule (667 mg total) by mouth 3 (three) times daily with meals. 90 capsule 0  . multivitamin (RENA-VIT) TABS tablet Take 1 tablet by mouth at bedtime. 30 tablet 0  . oxyCODONE-acetaminophen (ROXICET) 5-325 MG per tablet Take 1 tablet by mouth every 6 (six) hours as needed for severe pain. 20 tablet 0   No current facility-administered medications on file prior to visit.   Family History  Problem Relation Age of Onset  . Diabetes Mother   . Diabetes Brother   . Diabetes Father   . Hypertension Sister    History   Social  History  . Marital Status: Married    Spouse Name: N/A  . Number of Children: N/A  . Years of Education: N/A   Occupational History  . Not on file.   Social History Main Topics  . Smoking status: Former Smoker    Quit date: 01/17/1984  . Smokeless tobacco: Never Used  . Alcohol Use: No  . Drug Use: No  . Sexual Activity: Not on file   Other Topics Concern  . Not on file   Social History Narrative    Review of Systems: Constitutional: Negative for fever, chills, diaphoresis, activity change, appetite change and fatigue. HENT: Negative for ear pain, nosebleeds, congestion, facial swelling, rhinorrhea, neck pain, neck stiffness and ear discharge.  Eyes: Negative for pain, discharge, redness, itching and visual disturbance. Respiratory: Negative for cough, choking, chest tightness, shortness of breath, wheezing and stridor.  Cardiovascular: Negative for chest pain, palpitations and leg swelling. Gastrointestinal: Negative for abdominal distention. Genitourinary: Negative for dysuria, urgency, frequency, hematuria, flank pain, decreased urine volume, difficulty urinating and dyspareunia.  Musculoskeletal: Negative for back pain, joint swelling, arthralgia and gait problem. Neurological: Negative for dizziness, tremors, seizures, syncope, facial asymmetry, speech difficulty, weakness, light-headedness, numbness and headaches.  Hematological: Negative for adenopathy. Does not bruise/bleed easily. Psychiatric/Behavioral: Negative for hallucinations, behavioral problems, confusion, dysphoric mood, decreased concentration and agitation.    Objective:   Filed Vitals:   02/08/15 1122  BP: 120/60  Pulse:   Temp:   Resp:     Physical Exam: Constitutional: Patient appears well-developed and well-nourished. No distress. HENT: Normocephalic, atraumatic, External right and left ear normal. Oropharynx is clear and moist.  Eyes: Conjunctivae and EOM are normal. PERRLA, no scleral  icterus. Neck: Normal ROM. Neck supple. No JVD. No tracheal deviation. No thyromegaly. CVS: RRR, S1/S2 +, no murmurs, no gallops, no carotid bruit.  Pulmonary: Effort and breath sounds normal, no stridor, rhonchi, wheezes, rales.  Abdominal: Soft. BS +, no distension, tenderness, rebound or guarding.  Musculoskeletal: Normal range of motion. No edema and no tenderness, left arm AVF.  Neuro: Alert. Normal reflexes, muscle tone coordination. No cranial nerve deficit. Skin: Skin is warm and dry. No rash noted. Not diaphoretic. No erythema. No pallor. Psychiatric: Normal mood and affect. Behavior, judgment, thought content normal.  Lab Results  Component Value Date   WBC 7.0 11/09/2014   HGB 13.6 01/18/2015   HCT 40.0 01/18/2015   MCV 95.3 11/09/2014   PLT 371 11/09/2014   Lab Results  Component Value Date   CREATININE 4.74* 11/09/2014   BUN 40* 11/09/2014   NA 135 01/18/2015   K 3.9 01/18/2015   CL 101 11/09/2014   CO2 30 11/09/2014    Lab Results  Component Value Date/Time   HGBA1C 10.8 02/08/2015 11:00 AM   HGBA1C 6.2* 10/30/2014 02:30 PM   Lipid Panel  No results found for: CHOL, TRIG, HDL, CHOLHDL, VLDL, LDLCALC     Assessment and plan:   1. Annual physical exam I have ordered blood work patient will come back for fasting blood test.  2. Other specified diabetes mellitus without complications Results for orders placed or performed in visit on 02/08/15  HgB A1c  Result Value Ref Range   Hemoglobin A1C 10.8    Hemoglobin A1c has trended up, patient was not taking any medication, I have started patient on insulin, advised patient for diabetes meal planning, keep the fingerstick log, patient will come back in 2 weeks for nurse visit CBG check. - Ambulatory referral to Ophthalmology - Lipid panel; Future - Vit D  25 hydroxy (rtn osteoporosis monitoring); Future - insulin aspart protamine - aspart (NOVOLOG MIX 70/30 FLEXPEN) (70-30) 100 UNIT/ML FlexPen; Inject 0.06 mLs  (6 Units total) into the skin 2 (two) times daily.  Dispense: 15 mL; Refill: 11  3. Chronic kidney disease (CKD), stage IV (severe) Patient is on dialysis, Tuesday Thursday and Saturdays he'll  4. Essential hypertension, benign Blood pressure is well controlled continue with amlodipine.  5. Blurry vision  - Ambulatory referral to Ophthalmology    Return in about 3 months (around 05/11/2015) for diabetes, CBG check in 2 weeks/Nurse Visit and fasting lab work .    The patient was given clear instructions to go to ER or return to medical center if symptoms don't improve, worsen or new problems develop. The patient verbalized understanding. The patient was told to call to get lab results if they haven't heard anything in the next week.    This note has been created with Surveyor, quantity. Any transcriptional errors are unintentional.   Lorayne Marek, MD

## 2015-02-08 NOTE — Progress Notes (Signed)
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

## 2015-03-08 ENCOUNTER — Ambulatory Visit: Payer: Self-pay | Attending: Internal Medicine

## 2015-03-08 VITALS — BP 123/78 | HR 84 | Temp 97.7°F | Resp 18 | Ht 62.0 in | Wt 129.0 lb

## 2015-03-08 DIAGNOSIS — E088 Diabetes mellitus due to underlying condition with unspecified complications: Secondary | ICD-10-CM

## 2015-03-08 LAB — GLUCOSE, POCT (MANUAL RESULT ENTRY): POC Glucose: 155 mg/dl — AB (ref 70–99)

## 2015-03-08 NOTE — Patient Instructions (Signed)
Per Dr. Annitta Needs, do not take insulin. Check blood sugars twice a day before meals. Always check sugar in the morning before eating or drinking and then check again before lunch or supper, or 2 hours after eating a meal.

## 2015-03-08 NOTE — Progress Notes (Signed)
Current blood glucose is 155, patient reports having nothing to eat or drink this morning. Patient has blood sugar log with him. Patient checks sugar once a day, in the morning, fasting. Readings range from 63-164. Patient has reading of 63 on 7 out of 20 days. Patient reports he has not been taking any insulin. Patient quit eating candy everyday and has added exercise daily, walking and riding bicycle.

## 2015-03-29 ENCOUNTER — Ambulatory Visit: Payer: Self-pay | Attending: Family Medicine | Admitting: *Deleted

## 2015-03-29 VITALS — BP 150/70 | HR 81 | Temp 98.2°F | Resp 14 | Ht 62.0 in | Wt 134.8 lb

## 2015-03-29 DIAGNOSIS — Z794 Long term (current) use of insulin: Secondary | ICD-10-CM | POA: Insufficient documentation

## 2015-03-29 DIAGNOSIS — Z87891 Personal history of nicotine dependence: Secondary | ICD-10-CM | POA: Insufficient documentation

## 2015-03-29 DIAGNOSIS — I10 Essential (primary) hypertension: Secondary | ICD-10-CM

## 2015-03-29 DIAGNOSIS — E1165 Type 2 diabetes mellitus with hyperglycemia: Secondary | ICD-10-CM

## 2015-03-29 LAB — GLUCOSE, POCT (MANUAL RESULT ENTRY): POC Glucose: 138 mg/dl — AB (ref 70–99)

## 2015-03-29 NOTE — Progress Notes (Signed)
Spoke with patient via IT sales professional, Restaurant manager, fast food Patient presents with friend for BP check, CBG and record review for T2DM after stopping 70/30 insulin due to low BS readings Med list reviewed; patient reports taking all other meds as directed; patient takes no anti-diabetic agents Patient's AM fasting blood sugars ranging 56-108 (3 outliers of 132, 142 and 151)  There are 7 AM fasting readings < 70. Patient denies s/sx of hypoglycemia at these times. S/sx of hypoglycemia and immediate actions to take discussed in detail In talking with patient it was revealed that patient is walking 2-3 miles every AM prior to checking BS. Patient directed to check AM fasting BS prior to walking and to eat snack if BS < 100. Patient agrees. Patient's before lunch blood sugars 125, 162 and 181 ( 3 readings) Patient's before dinner blood sugars ranging 62-125 (1 outlier of 142) (3 readings < 70) Patient's before bed blood sugars ranging 91-97 (1 outlier of 138) Pt receives dialysis three times weekly (T, TH and Sat) through left arm. BP taken in right arm Patient has home BP monitoring device. Patient instructed on most accurate way to take BP, to keep BP log with date and time and how he is feeling and bring log to all future visits. States this AM's BP after 2-3 mile walk was 122/62  Lab Results  Component Value Date   HGBA1C 10.8 02/08/2015   Filed Vitals:   03/29/15 1416  BP: 150/70  Pulse: 81  Temp: 98.2 F (36.8 C)  Resp: 14    Patient will begin to check BS AM fasting prior to walking and again either before lunch or before dinner Patient given new blood sugar log and instructed on use. Instructed to bring to all future visits.  Patient to return in 3 weeks for nurse visit for BP check, CBG and record review   Patient given literature on Hypoglycemia  Note routed to Medical Director

## 2015-04-13 ENCOUNTER — Other Ambulatory Visit: Payer: Self-pay

## 2015-04-13 DIAGNOSIS — T82510A Breakdown (mechanical) of surgically created arteriovenous fistula, initial encounter: Secondary | ICD-10-CM

## 2015-04-13 DIAGNOSIS — Z01810 Encounter for preprocedural cardiovascular examination: Secondary | ICD-10-CM

## 2015-04-18 ENCOUNTER — Encounter: Payer: Self-pay | Admitting: Vascular Surgery

## 2015-04-18 ENCOUNTER — Ambulatory Visit (INDEPENDENT_AMBULATORY_CARE_PROVIDER_SITE_OTHER)
Admission: RE | Admit: 2015-04-18 | Discharge: 2015-04-18 | Disposition: A | Discharge status: Home / Self Care | Payer: Self-pay | Source: Ambulatory Visit | Attending: Vascular Surgery | Admitting: Vascular Surgery

## 2015-04-18 ENCOUNTER — Ambulatory Visit (HOSPITAL_COMMUNITY)
Admission: RE | Admit: 2015-04-18 | Discharge: 2015-04-18 | Disposition: A | Discharge status: Home / Self Care | Payer: Self-pay | Source: Ambulatory Visit | Attending: Vascular Surgery | Admitting: Vascular Surgery

## 2015-04-18 DIAGNOSIS — T82510A Breakdown (mechanical) of surgically created arteriovenous fistula, initial encounter: Secondary | ICD-10-CM

## 2015-04-18 DIAGNOSIS — Z01810 Encounter for preprocedural cardiovascular examination: Secondary | ICD-10-CM

## 2015-04-19 ENCOUNTER — Encounter: Payer: Self-pay | Admitting: Vascular Surgery

## 2015-04-19 ENCOUNTER — Ambulatory Visit (INDEPENDENT_AMBULATORY_CARE_PROVIDER_SITE_OTHER): Payer: Self-pay | Admitting: Vascular Surgery

## 2015-04-19 ENCOUNTER — Other Ambulatory Visit: Payer: Self-pay

## 2015-04-19 VITALS — BP 128/79 | HR 75 | Temp 97.2°F | Resp 14 | Ht 62.0 in | Wt 134.0 lb

## 2015-04-19 DIAGNOSIS — N186 End stage renal disease: Secondary | ICD-10-CM

## 2015-04-19 DIAGNOSIS — Z992 Dependence on renal dialysis: Secondary | ICD-10-CM

## 2015-04-19 NOTE — Progress Notes (Signed)
Established Dialysis Access  History of Present Illness  Brian Fuller is a 64 y.o. ([birth year]) male who presents for re-evaluation for permanent access.  The patient is right hand dominant.  Previous access procedures have been completed in the left arm.  The patient's complication from previous access procedures include: inconsistent cannulation and infilitration.  The patient has never had a previous PPM placed.  To date he has had:  1. 10/28/14  L RC AVF 2. 11/04/14  RIJV TDC 3. 01/18/15  Revision of L RC AVF  Past Medical History  Diagnosis Date  . Diabetes mellitus without complication   . Glaucoma   . Anemia   . Hypertension   . Chronic kidney disease     dialysis, T/Th/Sat  . Diabetic retinopathy     Past Surgical History  Procedure Laterality Date  . Eye surgery    . Av fistula placement Left 11/02/2014    Procedure: ARTERIOVENOUS (AV) FISTULA CREATION;  Surgeon: Mal Misty, MD;  Location: [facility];  Service: Vascular;  Laterality: Left;  . Insertion of dialysis catheter Right 11/04/2014    Procedure: INSERTION OF DIALYSIS CATHETER RIGHT INTERNAL JUGULAR VEIN;  Surgeon: Mal Misty, MD;  Location: [facility];  Service: Vascular;  Laterality: Right;  . Tonsillectomy    . Revison of arteriovenous fistula Left 01/18/2015    Procedure: REVISON OF LEFT RADIOCEPHALIC ARTERIOVENOUS FISTULA USING VASCU-GUARD PERIPHERAL VASCULAR PATCH ;  Surgeon: Mal Misty, MD;  Location: [facility];  Service: Vascular;  Laterality: Left;    Social History   Social History  . Marital Status: Married    Spouse Name: N/A  . Number of Children: N/A  . Years of Education: N/A   Occupational History  . Not on file.   Social History Main Topics  . Smoking status: Former Smoker    Quit date: 01/17/1984  . Smokeless tobacco: Never Used  . Alcohol Use: No  . Drug Use: No  . Sexual Activity: Not on file   Other Topics Concern  . Not on file   Social History Narrative    Family  History  Problem Relation Age of Onset  . Diabetes Mother   . Diabetes Brother   . Diabetes Father   . Hypertension Sister      Current Outpatient Prescriptions  Medication Sig Dispense Refill  . amLODipine (NORVASC) 5 MG tablet Take 1 tablet (5 mg total) by mouth every Monday, Wednesday, and Friday. 30 tablet 0  . calcium acetate (PHOSLO) 667 MG capsule Take 1 capsule (667 mg total) by mouth 3 (three) times daily with meals. 90 capsule 0  . multivitamin (RENA-VIT) TABS tablet Take 1 tablet by mouth at bedtime. 30 tablet 0  . insulin aspart protamine - aspart (NOVOLOG MIX 70/30 FLEXPEN) (70-30) 100 UNIT/ML FlexPen Inject 0.06 mLs (6 Units total) into the skin 2 (two) times daily. (Patient not taking: Reported on 03/08/2015) 15 mL 11  . oxyCODONE-acetaminophen (ROXICET) 5-325 MG per tablet Take 1 tablet by mouth every 6 (six) hours as needed for severe pain. (Patient not taking: Reported on 03/08/2015) 20 tablet 0   No current facility-administered medications for this visit.     No Known Allergies   REVIEW OF SYSTEMS:  (Positives checked otherwise negative)  CARDIOVASCULAR:   [ ]  chest pain,  [ ]  chest pressure,  [ ]  palpitations,  [ ]  shortness of breath when laying flat,  [ ]  shortness of breath with exertion,   [ ]   pain in feet when walking,  [ ]  pain in feet when laying flat, [ ]  history of blood clot in veins (DVT),  [ ]  history of phlebitis,  [ ]  swelling in legs,  [ ]  varicose veins  PULMONARY:   [ ]  productive cough,  [ ]  asthma,  [ ]  wheezing  NEUROLOGIC:   [ ]  weakness in arms or legs,  [ ]  numbness in arms or legs,  [ ]  difficulty speaking or slurred speech,  [ ]  temporary loss of vision in one eye,  [ ]  dizziness  HEMATOLOGIC:   [ ]  bleeding problems,  [ ]  problems with blood clotting too easily  MUSCULOSKEL:   [ ]  joint pain, [ ]  joint swelling  GASTROINTEST:   [ ]  vomiting blood,  [ ]  blood in stool     GENITOURINARY:   [ ]  burning with  urination,  [ ]  blood in urine [x]  ESRD-HD: T-R-F  PSYCHIATRIC:   [ ]  history of major depression  INTEGUMENTARY:   [ ]  rashes,  [ ]  ulcers  CONSTITUTIONAL:   [ ]  fever,  [ ]  chills    Physical Examination  Filed Vitals:   04/19/15 1453  BP: 128/79  Pulse: 75  Temp: 97.2 F (36.2 C)  Resp: 14  Height: 5\' 2"  (1.575 m)  Weight: 134 lb (60.782 kg)  SpO2: 99%   Body mass index is 24.5 kg/(m^2).  General: A&O x 3, WD, WN  Pulmonary: Sym exp, good air movt, CTAB, no rales, rhonchi, & wheezing  Cardiac: RRR, Nl S1, S2, no Murmurs, rubs or gallops  Vascular: Vessel Right Left  Radial Palpable Palpable  Ulnar Faintly Palpable Not Palpable  Brachial Palpable Palpable   Gastrointestinal: soft, NTND, no G/R, bo HSM, no masses, no CVAT B  Musculoskeletal: M/S 5/5 throughout , Extremities without  ischemic changes , palpable thrill in access in proximal and distal fistula, + bruit in access: strongest proximally and distally, On Sonosite: 5 mm throughout  Neurologic: Pain and light touch intact in extremities , Motor exam as listed above   Medical Decision Making  Brian Fuller is a 64 y.o. male who presents with ESRD requiring hemodialysis, s/p revision of L RC AVF still with cannulation issues .   Pt continues to have cannulation and infiltration issues.    In my opinion, this L RC AVF has failed to adequately mature.  His HD center is unable to use the fistula consistent.  On the sonosite, the patient appears to have recurrent distal fistula stenosis, which would require yet another revision.  I recommended: ligation of L RC AVF, placement of L BC AVF.  I had an extensive discussion with this patient in regards to the nature of access surgery, including risk, benefits, and alternatives.    The patient is aware that the risks of access surgery include but are not limited to: bleeding, infection, steal syndrome, nerve damage, ischemic monomelic neuropathy,  failure of access to mature, and possible need for additional access procedures in the future.  The patient has agreed to proceed with the above procedure which will be scheduled 7 SEP 16.   Adele Barthel, MD [facility] Office: [phone] Pager: [phone]  04/19/2015, 4:14 PM

## 2015-04-20 ENCOUNTER — Encounter: Payer: Self-pay | Admitting: Pharmacist

## 2015-04-20 ENCOUNTER — Ambulatory Visit: Payer: Self-pay | Attending: Internal Medicine | Admitting: Pharmacist

## 2015-04-20 VITALS — BP 122/75 | HR 83

## 2015-04-20 DIAGNOSIS — Z87891 Personal history of nicotine dependence: Secondary | ICD-10-CM | POA: Insufficient documentation

## 2015-04-20 DIAGNOSIS — E1165 Type 2 diabetes mellitus with hyperglycemia: Secondary | ICD-10-CM | POA: Insufficient documentation

## 2015-04-20 DIAGNOSIS — I10 Essential (primary) hypertension: Secondary | ICD-10-CM | POA: Insufficient documentation

## 2015-04-20 NOTE — Patient Instructions (Signed)
Let us know if you have multiple blood sugars (<80) or multiple high blood pressures (>140/90) at home

## 2015-04-20 NOTE — Progress Notes (Signed)
S:    Patient arrives in good spirits with daughter.  Patient was seen using virtual interpreter Doylene Canning [phone]).  Presents for blood glucose log review and blood pressure check.  Patient reports adherence with medications. He does not currently take any medications for diabetes.   Patient denies hypoglycemic events.   O:  . Lab Results  Component Value Date   HGBA1C 10.8 02/08/2015     Home fasting CBG: 88-120s, one outlier of 145 2 hour post-prandial/random CBG: 88-145  Home blood pressure log: all <130s/80s, no hypotension recorded  A/P: Diabetes currently uncontrolled based on A1c of 10.8 but under improved control based on home blood glucose readings. I expect his next A1c to be at goal.  Denies hypoglycemic events and is able to verbalize appropriate hypoglycemia management plan.  Reports adherence with medication. No recommendations for any changes to medication. Encouraged patient to report consistent hypoglycemic episodes or blood pressures >140/90.   Next A1C anticipated end of September/October 2016.  Written patient instructions provided.  Follow up in Pharmacist Clinic Visit as needed - next visit with new primary care physician (was Dr. Conley Canal patient).  Total time in face to face counseling 20 minutes.

## 2015-04-25 ENCOUNTER — Encounter (HOSPITAL_COMMUNITY): Payer: Self-pay | Admitting: *Deleted

## 2015-04-25 NOTE — Progress Notes (Signed)
Spoke with pt via [employer] (phone) [place], [place] # [phone]. Pt denies any cardiac history, chest pain or sob. Does not take any diabetic medications for diabetes. Note in EPIC from pharmacist on 04/20/15 that fasting blood sugars run between 88-120.

## 2015-04-25 NOTE — Progress Notes (Signed)
Called pt via [employer], [place] ([place] [phone]) to notify pt of arrival time change. Instructed pt to be here tomorrow at 12:00 noon instead of 11:30 AM. Pt responded that his ride knows to have him here at 11:30 and he won't see or talk with that person again. I told him that would be fine if he got here early.

## 2015-04-26 ENCOUNTER — Encounter (HOSPITAL_COMMUNITY): Payer: Self-pay | Admitting: Certified Registered Nurse Anesthetist

## 2015-04-26 ENCOUNTER — Ambulatory Visit (HOSPITAL_COMMUNITY): Payer: Self-pay | Admitting: Certified Registered Nurse Anesthetist

## 2015-04-26 ENCOUNTER — Ambulatory Visit (HOSPITAL_COMMUNITY)
Admission: RE | Admit: 2015-04-26 | Discharge: 2015-04-26 | Disposition: A | Discharge status: Home / Self Care | Payer: Self-pay | Source: Ambulatory Visit | Attending: Vascular Surgery | Admitting: Vascular Surgery

## 2015-04-26 ENCOUNTER — Encounter (HOSPITAL_COMMUNITY)
Admission: RE | Disposition: A | Discharge status: Home / Self Care | Payer: Self-pay | Source: Ambulatory Visit | Attending: Vascular Surgery

## 2015-04-26 DIAGNOSIS — N186 End stage renal disease: Secondary | ICD-10-CM | POA: Insufficient documentation

## 2015-04-26 DIAGNOSIS — Z992 Dependence on renal dialysis: Secondary | ICD-10-CM | POA: Insufficient documentation

## 2015-04-26 DIAGNOSIS — E1122 Type 2 diabetes mellitus with diabetic chronic kidney disease: Secondary | ICD-10-CM | POA: Insufficient documentation

## 2015-04-26 DIAGNOSIS — Z87891 Personal history of nicotine dependence: Secondary | ICD-10-CM | POA: Insufficient documentation

## 2015-04-26 DIAGNOSIS — E11319 Type 2 diabetes mellitus with unspecified diabetic retinopathy without macular edema: Secondary | ICD-10-CM | POA: Insufficient documentation

## 2015-04-26 DIAGNOSIS — N185 Chronic kidney disease, stage 5: Secondary | ICD-10-CM

## 2015-04-26 DIAGNOSIS — I120 Hypertensive chronic kidney disease with stage 5 chronic kidney disease or end stage renal disease: Secondary | ICD-10-CM | POA: Insufficient documentation

## 2015-04-26 HISTORY — DX: Pneumonia, unspecified organism: J18.9

## 2015-04-26 HISTORY — PX: AV FISTULA PLACEMENT: SHX1204

## 2015-04-26 HISTORY — PX: LIGATION OF ARTERIOVENOUS  FISTULA: SHX5948

## 2015-04-26 LAB — POCT I-STAT 4, (NA,K, GLUC, HGB,HCT)
Glucose, Bld: 102 mg/dL — ABNORMAL HIGH (ref 65–99)
HCT: 36 % — ABNORMAL LOW (ref 39.0–52.0)
Hemoglobin: 12.2 g/dL — ABNORMAL LOW (ref 13.0–17.0)
POTASSIUM: 4.6 mmol/L (ref 3.5–5.1)
SODIUM: 136 mmol/L (ref 135–145)

## 2015-04-26 LAB — GLUCOSE, CAPILLARY
Glucose-Capillary: 96 mg/dL (ref 65–99)
Glucose-Capillary: 78 mg/dL (ref 65–99)
Glucose-Capillary: 74 mg/dL (ref 65–99)

## 2015-04-26 SURGERY — LIGATION OF ARTERIOVENOUS  FISTULA
Anesthesia: Monitor Anesthesia Care | Site: Arm Upper | Laterality: Left

## 2015-04-26 MED ORDER — THROMBIN 20000 UNITS EX SOLR
CUTANEOUS | Status: AC
  Filled 2015-04-26: qty 20000

## 2015-04-26 MED ORDER — LIDOCAINE HCL (CARDIAC) 20 MG/ML IV SOLN
INTRAVENOUS | Status: AC
  Filled 2015-04-26: qty 5

## 2015-04-26 MED ORDER — 0.9 % SODIUM CHLORIDE (POUR BTL) OPTIME
TOPICAL | Status: DC | PRN
  Administered 2015-04-26: 1000 mL

## 2015-04-26 MED ORDER — CHLORHEXIDINE GLUCONATE CLOTH 2 % EX PADS: 6.0000 | MEDICATED_PAD | Freq: Once | CUTANEOUS | Status: DC

## 2015-04-26 MED ORDER — ONDANSETRON HCL 4 MG/2ML IJ SOLN
INTRAMUSCULAR | Status: DC | PRN
  Administered 2015-04-26: 4 mg via INTRAVENOUS

## 2015-04-26 MED ORDER — SODIUM CHLORIDE 0.9 % IV SOLN: INTRAVENOUS | Status: DC

## 2015-04-26 MED ORDER — MIDAZOLAM HCL 5 MG/5ML IJ SOLN
INTRAMUSCULAR | Status: DC | PRN
  Administered 2015-04-26: 2 mg via INTRAVENOUS

## 2015-04-26 MED ORDER — DEXTROSE 5 % IV SOLN
10.0000 mg | INTRAVENOUS | Status: DC | PRN
  Administered 2015-04-26: 15 ug/min via INTRAVENOUS

## 2015-04-26 MED ORDER — PHENYLEPHRINE HCL 10 MG/ML IJ SOLN
INTRAMUSCULAR | Status: DC | PRN
  Administered 2015-04-26: 80 ug via INTRAVENOUS

## 2015-04-26 MED ORDER — PROPOFOL INFUSION 10 MG/ML OPTIME
INTRAVENOUS | Status: DC | PRN
  Administered 2015-04-26: 50 ug/kg/min via INTRAVENOUS

## 2015-04-26 MED ORDER — CEFUROXIME SODIUM 1.5 G IJ SOLR: 1.5000 g | INTRAMUSCULAR | Status: DC

## 2015-04-26 MED ORDER — FENTANYL CITRATE (PF) 250 MCG/5ML IJ SOLN
INTRAMUSCULAR | Status: AC
  Filled 2015-04-26: qty 5

## 2015-04-26 MED ORDER — LIDOCAINE HCL (PF) 1 % IJ SOLN
INTRAMUSCULAR | Status: DC | PRN
  Administered 2015-04-26: 5 mL

## 2015-04-26 MED ORDER — HYDROMORPHONE HCL 1 MG/ML IJ SOLN: 0.2500 mg | INTRAMUSCULAR | Status: DC | PRN

## 2015-04-26 MED ORDER — FENTANYL CITRATE (PF) 100 MCG/2ML IJ SOLN
INTRAMUSCULAR | Status: DC | PRN
  Administered 2015-04-26: 25 ug via INTRAVENOUS
  Administered 2015-04-26: 50 ug via INTRAVENOUS
  Administered 2015-04-26: 25 ug via INTRAVENOUS

## 2015-04-26 MED ORDER — SODIUM CHLORIDE 0.9 % IR SOLN
Status: DC | PRN
  Administered 2015-04-26: 500 mL

## 2015-04-26 MED ORDER — MIDAZOLAM HCL 2 MG/2ML IJ SOLN
INTRAMUSCULAR | Status: AC
  Filled 2015-04-26: qty 4

## 2015-04-26 MED ORDER — SODIUM CHLORIDE 0.9 % IV SOLN
INTRAVENOUS | Status: DC
  Administered 2015-04-26: 15:00:00 via INTRAVENOUS

## 2015-04-26 MED ORDER — PROPOFOL 10 MG/ML IV BOLUS
INTRAVENOUS | Status: AC
  Filled 2015-04-26: qty 20

## 2015-04-26 MED ORDER — ONDANSETRON HCL 4 MG/2ML IJ SOLN
INTRAMUSCULAR | Status: AC
Start: 2015-04-26 — End: 2015-04-26
  Filled 2015-04-26: qty 2

## 2015-04-26 MED ORDER — OXYCODONE-ACETAMINOPHEN 5-325 MG PO TABS: 1.0000 | ORAL_TABLET | ORAL | Status: DC | PRN

## 2015-04-26 MED ORDER — CEFAZOLIN SODIUM-DEXTROSE 2-3 GM-% IV SOLR
INTRAVENOUS | Status: AC
  Administered 2015-04-26: 2 g via INTRAVENOUS
  Filled 2015-04-26: qty 50

## 2015-04-26 MED ORDER — LIDOCAINE HCL (PF) 1 % IJ SOLN
INTRAMUSCULAR | Status: AC
  Filled 2015-04-26: qty 30

## 2015-04-26 SURGICAL SUPPLY — 38 items
ARMBAND PINK RESTRICT EXTREMIT (MISCELLANEOUS) ×4 IMPLANT
CANISTER SUCTION 2500CC (MISCELLANEOUS) ×4 IMPLANT
CLIP TI MEDIUM 6 (CLIP) ×4 IMPLANT
CLIP TI WIDE RED SMALL 6 (CLIP) ×4 IMPLANT
COVER PROBE W GEL 5X96 (DRAPES) ×4 IMPLANT
DECANTER SPIKE VIAL GLASS SM (MISCELLANEOUS) ×4 IMPLANT
ELECT REM PT RETURN 9FT ADLT (ELECTROSURGICAL) ×4
ELECTRODE REM PT RTRN 9FT ADLT (ELECTROSURGICAL) ×2 IMPLANT
GAUZE SPONGE 4X4 12PLY STRL (GAUZE/BANDAGES/DRESSINGS) ×4 IMPLANT
GEL ULTRASOUND 20GR AQUASONIC (MISCELLANEOUS) IMPLANT
GLOVE BIO SURGEON STRL SZ 6.5 (GLOVE) IMPLANT
GLOVE BIO SURGEON STRL SZ7 (GLOVE) ×4 IMPLANT
GLOVE BIO SURGEONS STRL SZ 6.5 (GLOVE)
GLOVE BIOGEL PI IND STRL 6.5 (GLOVE) ×4 IMPLANT
GLOVE BIOGEL PI IND STRL 7.5 (GLOVE) ×4 IMPLANT
GLOVE BIOGEL PI INDICATOR 6.5 (GLOVE) ×4
GLOVE BIOGEL PI INDICATOR 7.5 (GLOVE) ×4
GLOVE SURG SS PI 7.0 STRL IVOR (GLOVE) ×4 IMPLANT
GOWN STRL REUS W/ TWL LRG LVL3 (GOWN DISPOSABLE) ×6 IMPLANT
GOWN STRL REUS W/TWL LRG LVL3 (GOWN DISPOSABLE) ×6
KIT BASIN OR (CUSTOM PROCEDURE TRAY) ×4 IMPLANT
KIT ROOM TURNOVER OR (KITS) ×4 IMPLANT
LIQUID BAND (GAUZE/BANDAGES/DRESSINGS) ×8 IMPLANT
NS IRRIG 1000ML POUR BTL (IV SOLUTION) ×4 IMPLANT
PACK CV ACCESS (CUSTOM PROCEDURE TRAY) ×4 IMPLANT
PAD ARMBOARD 7.5X6 YLW CONV (MISCELLANEOUS) ×8 IMPLANT
SPONGE SURGIFOAM ABS GEL 100 (HEMOSTASIS) IMPLANT
SUT ETHILON 3 0 PS 1 (SUTURE) IMPLANT
SUT MNCRL AB 4-0 PS2 18 (SUTURE) ×8 IMPLANT
SUT PROLENE 6 0 BV (SUTURE) ×8 IMPLANT
SUT PROLENE 7 0 BV 1 (SUTURE) ×4 IMPLANT
SUT SILK 0 TIES 10X30 (SUTURE) ×4 IMPLANT
SUT VIC AB 3-0 SH 27 (SUTURE) ×4
SUT VIC AB 3-0 SH 27X BRD (SUTURE) ×4 IMPLANT
SWAB COLLECTION DEVICE MRSA (MISCELLANEOUS) IMPLANT
TUBE ANAEROBIC SPECIMEN COL (MISCELLANEOUS) IMPLANT
UNDERPAD 30X30 INCONTINENT (UNDERPADS AND DIAPERS) ×4 IMPLANT
WATER STERILE IRR 1000ML POUR (IV SOLUTION) ×4 IMPLANT

## 2015-04-26 NOTE — Transfer of Care (Signed)
Immediate Anesthesia Transfer of Care Note  Patient: Brian Fuller  Procedure(s) Performed: Procedure(s): LIGATION OF LEFT RADIOCEPHALIC ARTERIOVENOUS  FISTULA (Left) LEFT BRACHIOCEPHALIC ARTERIOVENOUS (AV) FISTULA CREATION (Left)  Patient Location: PACU  Anesthesia Type:MAC  Level of Consciousness: awake, alert  and oriented  Airway & Oxygen Therapy: Patient Spontanous Breathing  Post-op Assessment: Report given to RN and Post -op Vital signs reviewed and stable  Post vital signs: Reviewed and stable  Last Vitals:  Filed Vitals:   04/26/15 1143  BP: 144/77  Pulse: 78  Temp: 36.5 C  Resp: 16    Complications: No apparent anesthesia complications

## 2015-04-26 NOTE — Op Note (Signed)
OPERATIVE NOTE   PROCEDURE: 1.  Left radiocephalic arteriovenous fistula ligation 2.  Left brachiocephalic arteriovenous fistula placement  PRE-OPERATIVE DIAGNOSIS: end stage renal disease   POST-OPERATIVE DIAGNOSIS: same as above   SURGEON: Adele Barthel, MD  ASSISTANT(S): Leontine Locket, PAC   ANESTHESIA: local and MAC  ESTIMATED BLOOD LOSS: 50 cc  FINDING(S): 1.  Sclerotic small distal radiocephalic arteriovenous fistula: biphasic radial signal after ligation 2.  Palpable thrill with dopplerable radial signal at end of case  SPECIMEN(S):  none  INDICATIONS:   Brian Fuller is a 64 y.o. male who presents with poorly maturing left radiocephalic arteriovenous fistula.  This fistula has not been usable despite surgical revision and percutaneous interventions.  I recommended proceeding with: L radiocephalic arteriovenous fistula ligation and left brachiocephalic arteriovenous fistula placement.  The patient is aware the risks include but are not limited to: bleeding, infection, steal syndrome, nerve damage, ischemic monomelic neuropathy, failure to mature, and need for additional procedures.  The patient is aware of the risks of the procedure and elects to proceed forward.  DESCRIPTION: After full informed written consent was obtained from the patient, the patient was brought back to the operating room and placed supine upon the operating table.  Prior to induction, the patient received IV antibiotics.   After obtaining adequate anesthesia, the patient was then prepped and draped in the standard fashion for a left arm access procedure.  I turned my attention first to identifying the patient's cephalic vein and brachial artery.  Using SonoSite guidance, the location of these vessels were marked out on the skin.  I also identified the left radiocephalic arteriovenous fistula under Sonosite guidance.   At this point, I injected 1% lidocaine without epinephrine to obtain a field  block of the antecubitum and distal wrist.  In total, I injected about 5 mL of local anesthestic.    I made a longitudinal incision over the distal radiocephalic arteriovenous fistula.  There was no discernable plane between the radial artery and fistula, so I extended the incision more proximally and dissected out the fistula more proximally where, I could separate the artery and vein.  I ligated the radiocephalic arteriovenous fistula two 2-0 silk ties and transected it.  Distally there was now a biphasic radial artery signal.  At this point, I made a traverse  incision at the level of the antecubitum and dissected through the subcutaneous tissue and fascia to gain exposure of the brachial artery.  This was noted to be 4 mm in diameter externally.  This was dissected out proximally and distally and controlled with vessel loops .  I then dissected out the cephalic vein.  This was noted to be 3-4 mm in diameter externally.  The distal segment of the vein was ligated with a  2-0 silk, and the vein was transected.  The proximal segment was interrogated with serial dilators.  The vein accepted up to a 4 mm dilator without any difficulty.  I then instilled the heparinized saline into the vein and clamped it.  At this point, I reset my exposure of the brachial artery and placed the artery under tension proximally and distally.  I made an arteriotomy with a #11 blade, and then I extended the arteriotomy with a Potts scissor.  I injected heparinized saline proximal and distal to this arteriotomy.  The vein was then sewn to the artery in an end-to-side configuration with a running stitch of 6-0 Prolene.  Prior to completing this anastomosis, I allowed  the vein and artery to backbleed.  There was no evidence of clot from any vessels.  I completed the anastomosis in the usual fashion and then released all vessel loops and clamps.  There was a palpable  thrill in the venous outflow, and there was a dopplerable radial  signal.  At this point, I irrigated out the surgical wound.  There was no further active bleeding.  The subcutaneous tissue was reapproximated with a running stitch of 3-0 Vicryl.  The skin was then reapproximated with a running subcuticular stitch of 4-0 Vicryl.  The skin was then cleaned, dried, and reinforced with Dermabond.  The patient tolerated this procedure well.    COMPLICATIONS: none  CONDITION: stable   Adele Barthel, MD [facility] Office: [phone] Pager: [phone]  04/26/2015, 4:36 PM

## 2015-04-26 NOTE — Interval H&P Note (Signed)
History and Physical Interval Note:  04/26/2015 2:35 PM  [name]  has presented today for surgery, with the diagnosis of End Stage Renal Disease N18.6  The various methods of treatment have been discussed with the patient and family. After consideration of risks, benefits and other options for treatment, the patient has consented to  Procedure(s): LIGATION OF RADIOCEPHALIC ARTERIOVENOUS  FISTULA (Left) BRACHIOCEPHALIC ARTERIOVENOUS (AV) FISTULA CREATION (Left) as a surgical intervention .  The patient's history has been reviewed, patient examined, no change in status, stable for surgery.  I have reviewed the patient's chart and labs.  Questions were answered to the patient's satisfaction.     Brian Fuller

## 2015-04-26 NOTE — Progress Notes (Signed)
Interpreter Lesle Chris for pre -surgery

## 2015-04-26 NOTE — Anesthesia Postprocedure Evaluation (Signed)
  Anesthesia Post-op Note  Patient: Brian Fuller  Procedure(s) Performed: Procedure(s): LIGATION OF LEFT RADIOCEPHALIC ARTERIOVENOUS  FISTULA (Left) LEFT BRACHIOCEPHALIC ARTERIOVENOUS (AV) FISTULA CREATION (Left)  Patient Location: PACU  Anesthesia Type:MAC  Level of Consciousness: awake, alert , oriented and patient cooperative  Airway and Oxygen Therapy: Patient Spontanous Breathing  Post-op Pain: none  Post-op Assessment: Post-op Vital signs reviewed, Patient's Cardiovascular Status Stable, Respiratory Function Stable, Patent Airway, No signs of Nausea or vomiting and Pain level controlled              Post-op Vital Signs: Reviewed and stable  Last Vitals:  Filed Vitals:   04/26/15 1743  BP: 152/62  Pulse: 74  Temp: 36.8 C  Resp: 16    Complications: No apparent anesthesia complications

## 2015-04-26 NOTE — Anesthesia Preprocedure Evaluation (Addendum)
Anesthesia Evaluation  Patient identified by MRN, date of birth, ID band Patient awake    Reviewed: Allergy & Precautions, H&P , NPO status , Patient's Chart, lab work & pertinent test results  Airway Mallampati: III  TM Distance: >3 FB Neck ROM: Full    Dental no notable dental hx. (+) Teeth Intact, Dental Advisory Given   Pulmonary neg pulmonary ROS, former smoker,    Pulmonary exam normal breath sounds clear to auscultation       Cardiovascular hypertension, On Medications negative cardio ROS   Rhythm:Regular Rate:Normal     Neuro/Psych negative neurological ROS  negative psych ROS   GI/Hepatic negative GI ROS, Neg liver ROS,   Endo/Other  negative endocrine ROSdiabetes  Renal/GU ESRF and DialysisRenal disease  negative genitourinary   Musculoskeletal   Abdominal   Peds  Hematology negative hematology ROS (+)   Anesthesia Other Findings   Reproductive/Obstetrics negative OB ROS                            Anesthesia Physical Anesthesia Plan  ASA: III  Anesthesia Plan: MAC   Post-op Pain Management:    Induction: Intravenous  Airway Management Planned: Simple Face Mask  Additional Equipment:   Intra-op Plan:   Post-operative Plan:   Informed Consent: I have reviewed the patients History and Physical, chart, labs and discussed the procedure including the risks, benefits and alternatives for the proposed anesthesia with the patient or authorized representative who has indicated his/her understanding and acceptance.   Dental advisory given  Plan Discussed with: CRNA  Anesthesia Plan Comments:        Anesthesia Quick Evaluation

## 2015-04-26 NOTE — H&P (View-Only) (Signed)
Established Dialysis Access  History of Present Illness  Brian Fuller is a 64 y.o. (08/24/1950) male who presents for re-evaluation for permanent access.  The patient is right hand dominant.  Previous access procedures have been completed in the left arm.  The patient's complication from previous access procedures include: inconsistent cannulation and infilitration.  The patient has never had a previous PPM placed.  To date he has had:  1. 10/28/14  L RC AVF 2. 11/04/14  RIJV TDC 3. 01/18/15  Revision of L RC AVF  Past Medical History  Diagnosis Date  . Diabetes mellitus without complication   . Glaucoma   . Anemia   . Hypertension   . Chronic kidney disease     dialysis, T/Th/Sat  . Diabetic retinopathy     Past Surgical History  Procedure Laterality Date  . Eye surgery    . Av fistula placement Left 11/02/2014    Procedure: ARTERIOVENOUS (AV) FISTULA CREATION;  Surgeon: Mal Misty, MD;  Location: [facility];  Service: Vascular;  Laterality: Left;  . Insertion of dialysis catheter Right 11/04/2014    Procedure: INSERTION OF DIALYSIS CATHETER RIGHT INTERNAL JUGULAR VEIN;  Surgeon: Mal Misty, MD;  Location: [facility];  Service: Vascular;  Laterality: Right;  . Tonsillectomy    . Revison of arteriovenous fistula Left 01/18/2015    Procedure: REVISON OF LEFT RADIOCEPHALIC ARTERIOVENOUS FISTULA USING VASCU-GUARD PERIPHERAL VASCULAR PATCH ;  Surgeon: Mal Misty, MD;  Location: [facility];  Service: Vascular;  Laterality: Left;    Social History   Social History  . Marital Status: Married    Spouse Name: N/A  . Number of Children: N/A  . Years of Education: N/A   Occupational History  . Not on file.   Social History Main Topics  . Smoking status: Former Smoker    Quit date: 01/17/1984  . Smokeless tobacco: Never Used  . Alcohol Use: No  . Drug Use: No  . Sexual Activity: Not on file   Other Topics Concern  . Not on file   Social History Narrative    Family  History  Problem Relation Age of Onset  . Diabetes Mother   . Diabetes Brother   . Diabetes Father   . Hypertension Sister      Current Outpatient Prescriptions  Medication Sig Dispense Refill  . amLODipine (NORVASC) 5 MG tablet Take 1 tablet (5 mg total) by mouth every Monday, Wednesday, and Friday. 30 tablet 0  . calcium acetate (PHOSLO) 667 MG capsule Take 1 capsule (667 mg total) by mouth 3 (three) times daily with meals. 90 capsule 0  . multivitamin (RENA-VIT) TABS tablet Take 1 tablet by mouth at bedtime. 30 tablet 0  . insulin aspart protamine - aspart (NOVOLOG MIX 70/30 FLEXPEN) (70-30) 100 UNIT/ML FlexPen Inject 0.06 mLs (6 Units total) into the skin 2 (two) times daily. (Patient not taking: Reported on 03/08/2015) 15 mL 11  . oxyCODONE-acetaminophen (ROXICET) 5-325 MG per tablet Take 1 tablet by mouth every 6 (six) hours as needed for severe pain. (Patient not taking: Reported on 03/08/2015) 20 tablet 0   No current facility-administered medications for this visit.     No Known Allergies   REVIEW OF SYSTEMS:  (Positives checked otherwise negative)  CARDIOVASCULAR:   [ ]  chest pain,  [ ]  chest pressure,  [ ]  palpitations,  [ ]  shortness of breath when laying flat,  [ ]  shortness of breath with exertion,   [ ]   pain in feet when walking,  [ ]  pain in feet when laying flat, [ ]  history of blood clot in veins (DVT),  [ ]  history of phlebitis,  [ ]  swelling in legs,  [ ]  varicose veins  PULMONARY:   [ ]  productive cough,  [ ]  asthma,  [ ]  wheezing  NEUROLOGIC:   [ ]  weakness in arms or legs,  [ ]  numbness in arms or legs,  [ ]  difficulty speaking or slurred speech,  [ ]  temporary loss of vision in one eye,  [ ]  dizziness  HEMATOLOGIC:   [ ]  bleeding problems,  [ ]  problems with blood clotting too easily  MUSCULOSKEL:   [ ]  joint pain, [ ]  joint swelling  GASTROINTEST:   [ ]  vomiting blood,  [ ]  blood in stool     GENITOURINARY:   [ ]  burning with  urination,  [ ]  blood in urine [x]  ESRD-HD: T-R-F  PSYCHIATRIC:   [ ]  history of major depression  INTEGUMENTARY:   [ ]  rashes,  [ ]  ulcers  CONSTITUTIONAL:   [ ]  fever,  [ ]  chills    Physical Examination  Filed Vitals:   04/19/15 1453  BP: 128/79  Pulse: 75  Temp: 97.2 F (36.2 C)  Resp: 14  Height: 5\' 2"  (1.575 m)  Weight: 134 lb (60.782 kg)  SpO2: 99%   Body mass index is 24.5 kg/(m^2).  General: A&O x 3, WD, WN  Pulmonary: Sym exp, good air movt, CTAB, no rales, rhonchi, & wheezing  Cardiac: RRR, Nl S1, S2, no Murmurs, rubs or gallops  Vascular: Vessel Right Left  Radial Palpable Palpable  Ulnar Faintly Palpable Not Palpable  Brachial Palpable Palpable   Gastrointestinal: soft, NTND, no G/R, bo HSM, no masses, no CVAT B  Musculoskeletal: M/S 5/5 throughout , Extremities without  ischemic changes , palpable thrill in access in proximal and distal fistula, + bruit in access: strongest proximally and distally, On Sonosite: 5 mm throughout  Neurologic: Pain and light touch intact in extremities , Motor exam as listed above   Medical Decision Making  Brian Fuller is a 64 y.o. male who presents with ESRD requiring hemodialysis, s/p revision of L RC AVF still with cannulation issues .   Pt continues to have cannulation and infiltration issues.    In my opinion, this L RC AVF has failed to adequately mature.  His HD center is unable to use the fistula consistent.  On the sonosite, the patient appears to have recurrent distal fistula stenosis, which would require yet another revision.  I recommended: ligation of L RC AVF, placement of L BC AVF.  I had an extensive discussion with this patient in regards to the nature of access surgery, including risk, benefits, and alternatives.    The patient is aware that the risks of access surgery include but are not limited to: bleeding, infection, steal syndrome, nerve damage, ischemic monomelic neuropathy,  failure of access to mature, and possible need for additional access procedures in the future.  The patient has agreed to proceed with the above procedure which will be scheduled 7 SEP 16.   Adele Barthel, MD [facility] Office: [phone] Pager: [phone]  04/19/2015, 4:14 PM

## 2015-04-27 ENCOUNTER — Encounter (HOSPITAL_COMMUNITY): Payer: Self-pay | Admitting: Vascular Surgery

## 2015-04-28 ENCOUNTER — Telehealth: Payer: Self-pay | Admitting: Vascular Surgery

## 2015-04-28 NOTE — Telephone Encounter (Signed)
Patient did not speak english- used language line to leave message, dpm

## 2015-04-28 NOTE — Telephone Encounter (Signed)
-----   Message from Mena Goes, RN sent at 04/27/2015  9:07 AM EDT ----- Regarding: schedule   ----- Message -----    From: Conrad , MD    Sent: 04/26/2015   4:41 PM      To: [facility] Charge Pool  [name] [id number] June 06, 1951   PROCEDURE: 1. Left radiocephalic arteriovenous fistula ligation 2. Left brachiocephalic arteriovenous fistula placement  Asst: Leontine Locket, PAC   Follow-up: 6 weeks

## 2015-06-06 ENCOUNTER — Encounter: Payer: Self-pay | Admitting: Vascular Surgery

## 2015-06-09 ENCOUNTER — Encounter: Payer: Self-pay | Admitting: Vascular Surgery

## 2015-06-09 ENCOUNTER — Ambulatory Visit (INDEPENDENT_AMBULATORY_CARE_PROVIDER_SITE_OTHER): Payer: Self-pay | Admitting: Vascular Surgery

## 2015-06-09 VITALS — BP 112/63 | HR 88 | Temp 97.8°F | Resp 16 | Ht 62.0 in | Wt 136.0 lb

## 2015-06-09 DIAGNOSIS — Z992 Dependence on renal dialysis: Secondary | ICD-10-CM

## 2015-06-09 DIAGNOSIS — N186 End stage renal disease: Secondary | ICD-10-CM

## 2015-06-09 NOTE — Progress Notes (Signed)
    Postoperative Access Visit   History of Present Illness  Brian Fuller is a 64 y.o. year old male who presents for postoperative follow-up for: L RC AVF ligation, L BC AVF (Date: 04/26/15).  The patient's wounds are healed.  The patient notes no steal symptoms.  The patient is able to complete their activities of daily living.  The patient's current symptoms are: none.  For VQI Use Only  PRE-ADM LIVING: Home  AMB STATUS: Ambulatory  Physical Examination Filed Vitals:   06/09/15 0909  BP: 112/63  Pulse: 88  Temp: 97.8 F (36.6 C)  Resp: 16    LUE: Incision is healed, skin feels warm, hand grip is 5/5, sensation in digits is intact, palpable thrill, bruit can be auscultated, fistula is visible, on Sonosite: distal 2/3 > 6 mm, proximal 1/3 <6 mm  Medical Decision Making  Brian Fuller is a 64 y.o. year old male who presents s/p ligation L RC AVF, L BC AVF.   Fistula is shallow and big enough to be used immediately despite proximal size < 6 mm  The patient's tunneled dialysis catheter can be removed after two successful cannulations and completed dialysis treatments.  Thank you for allowing [place] to participate in this patient's care.  Adele Barthel, MD [facility] Office: [phone] Pager: [phone]  06/09/2015, 9:33 AM

## 2015-08-02 ENCOUNTER — Other Ambulatory Visit: Payer: Self-pay

## 2015-08-04 ENCOUNTER — Other Ambulatory Visit (HOSPITAL_COMMUNITY): Payer: Self-pay | Admitting: *Deleted

## 2015-08-07 ENCOUNTER — Ambulatory Visit (HOSPITAL_COMMUNITY)
Admission: RE | Admit: 2015-08-07 | Discharge: 2015-08-07 | Disposition: A | Discharge status: Home / Self Care | Payer: Self-pay | Source: Ambulatory Visit | Attending: Vascular Surgery | Admitting: Vascular Surgery

## 2015-08-07 DIAGNOSIS — Z87891 Personal history of nicotine dependence: Secondary | ICD-10-CM | POA: Insufficient documentation

## 2015-08-07 DIAGNOSIS — N186 End stage renal disease: Secondary | ICD-10-CM | POA: Insufficient documentation

## 2015-08-07 DIAGNOSIS — Z79899 Other long term (current) drug therapy: Secondary | ICD-10-CM | POA: Insufficient documentation

## 2015-08-07 DIAGNOSIS — E1122 Type 2 diabetes mellitus with diabetic chronic kidney disease: Secondary | ICD-10-CM | POA: Insufficient documentation

## 2015-08-07 DIAGNOSIS — E11319 Type 2 diabetes mellitus with unspecified diabetic retinopathy without macular edema: Secondary | ICD-10-CM | POA: Insufficient documentation

## 2015-08-07 DIAGNOSIS — I120 Hypertensive chronic kidney disease with stage 5 chronic kidney disease or end stage renal disease: Secondary | ICD-10-CM | POA: Insufficient documentation

## 2015-08-07 DIAGNOSIS — Z4901 Encounter for fitting and adjustment of extracorporeal dialysis catheter: Secondary | ICD-10-CM | POA: Insufficient documentation

## 2015-08-07 MED ORDER — LIDOCAINE HCL (PF) 2 % IJ SOLN
INTRAMUSCULAR | Status: AC
  Filled 2015-08-07: qty 10

## 2015-08-07 NOTE — Progress Notes (Signed)
  [facility] Catheter Removal Procedure Note  Diagnosis: ESRD  Plan:  Remove right diatek catheter  Consent signed:  Yes.   Time out completed:  Yes.   Coumadin:  No. PT/INR (if applicable):   Other labs:  Procedure: 1.  Sterile prepping and draping over catheter area 2. 8 ml 2% lidocaine plain instilled at removal site. 3.  right catheter removed in its entirety with cuff in tact. 4.  Complications:  none 5. Tip of catheter sent for culture:  No.   Patient tolerated procedure well:  Yes.   Pressure held, no bleeding noted, dressing applied Instructions given to the pt regarding wound care and bleeding.  Other:  Virgina Jock, PA-C 08/07/2015 1:52 PM

## 2015-08-07 NOTE — Progress Notes (Signed)
  [facility] SHORT STAY H&P  CC:  Catheter removal   HPI:  This is a 64 y.o. male here for diatek catheter removal.  He has a functioning left upper arm fistula. He is on any blood thinners.   Past Medical History  Diagnosis Date  . Diabetes mellitus without complication ([place])   . Glaucoma   . Anemia   . Hypertension   . Chronic kidney disease     dialysis, T/Th/Sat  . Diabetic retinopathy ([place])   . Pneumonia     FH:  Non-Contributory  Social History   Social History  . Marital Status: Married    Spouse Name: N/A  . Number of Children: N/A  . Years of Education: N/A   Occupational History  . Not on file.   Social History Main Topics  . Smoking status: Former Smoker    Quit date: 01/17/1984  . Smokeless tobacco: Never Used  . Alcohol Use: No  . Drug Use: No  . Sexual Activity: Not on file   Other Topics Concern  . Not on file   Social History Narrative    No Known Allergies  Current Outpatient Prescriptions  Medication Sig Dispense Refill  . amLODipine (NORVASC) 5 MG tablet Take 1 tablet (5 mg total) by mouth every Monday, Wednesday, and Friday. (Patient taking differently: Take 2.5 mg by mouth every Monday, Wednesday, and Friday. ) 30 tablet 0  . calcium acetate (PHOSLO) 667 MG capsule Take 1 capsule (667 mg total) by mouth 3 (three) times daily with meals. 90 capsule 0  . Cyanocobalamin (VITAMIN B 12 PO) Take by mouth every evening.    Marland Kitchen oxyCODONE-acetaminophen (ROXICET) 5-325 MG per tablet Take 1-2 tablets by mouth every 4 (four) hours as needed for moderate pain. 30 tablet 0   Current Facility-Administered Medications  Medication Dose Route Frequency Provider Last Rate Last Dose  . lidocaine (XYLOCAINE) 2 % injection             ROS:  See HPI  PHYSICAL EXAM  Filed Vitals:   08/07/15 1258  BP: 132/48  Pulse: 85  Temp: 97.7 F (36.5 C)  Resp: 20    Gen:  Well developed well nourished HEENT:  normocephalic Neck:  Right IJ  TDC Heart:  regular Lungs:  Non-labored Extremities:  Left upper fistula with palpable thrill Skin:  No obvious rashes Neuro:  No focal deficits  Lab/X-ray:  Impression: This is a 64 y.o. male here for diatek catheter removal  Plan:  Removal of right diatek catheter  Virgina Jock, PA-C [facility] [phone] 08/07/2015 1:51 PM

## 2015-08-20 DIAGNOSIS — H547 Unspecified visual loss: Secondary | ICD-10-CM

## 2015-08-20 HISTORY — DX: Unspecified visual loss: H54.7

## 2015-12-27 IMAGING — CR DG CHEST 1V PORT
1 series · 1 of 1 positions shown · non-contrast
Comparison: 10/30/2014.

CLINICAL DATA: Dyspnea since yesterday.

EXAM:
PORTABLE CHEST - 1 VIEW

[AP]
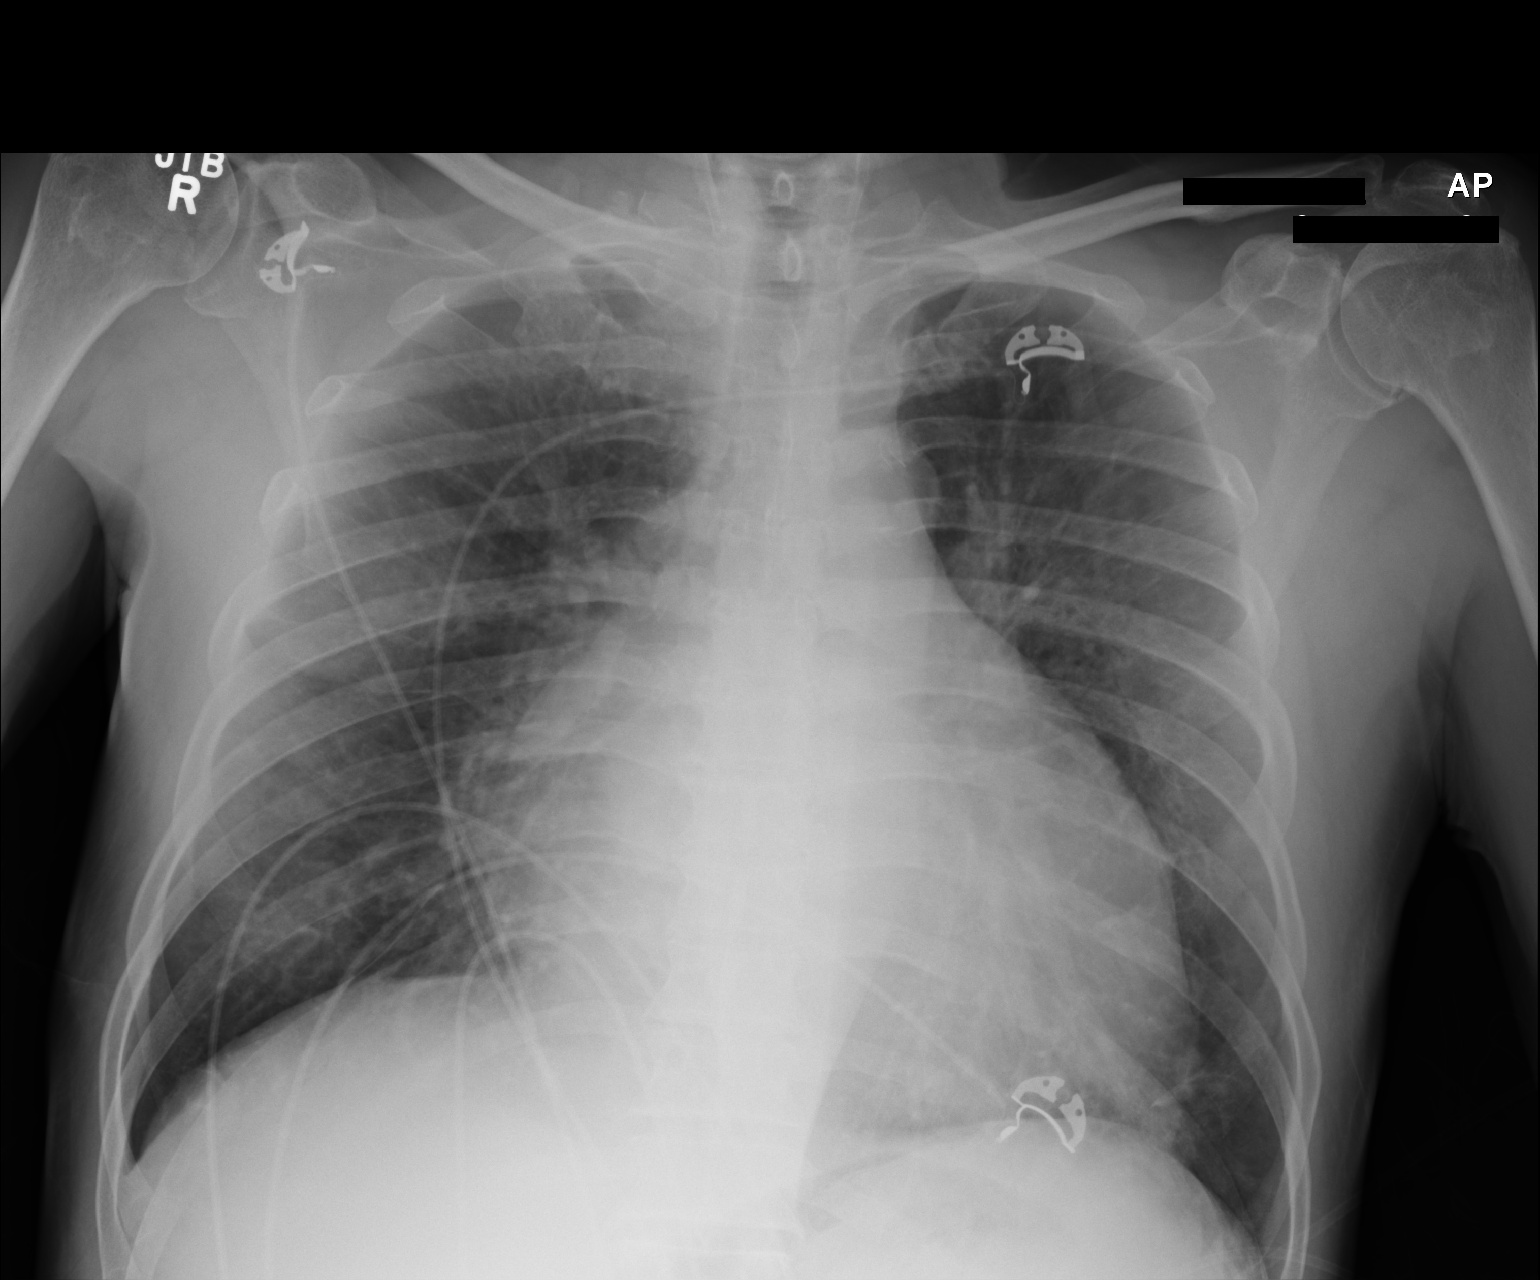

[1 of 1 positions shown; findings below may reference images not displayed]

FINDINGS: Stable enlarged cardiac silhouette. The previously demonstrated
bilateral airspace opacity is almost completely resolved. No pleural
fluid seen. Lower thoracic spine degenerative changes.
IMPRESSION: 1. Significantly improved bilateral alveolar edema. Pneumonia is
less likely given the rapid improvement.
2. Stable cardiomegaly.

## 2015-12-30 IMAGING — CR DG CHEST 1V PORT
1 series · 1 of 1 positions shown · non-contrast
Comparison: 11/01/2014

CLINICAL DATA: Post dialysis catheter placement

EXAM:
PORTABLE CHEST - 1 VIEW

[AP]
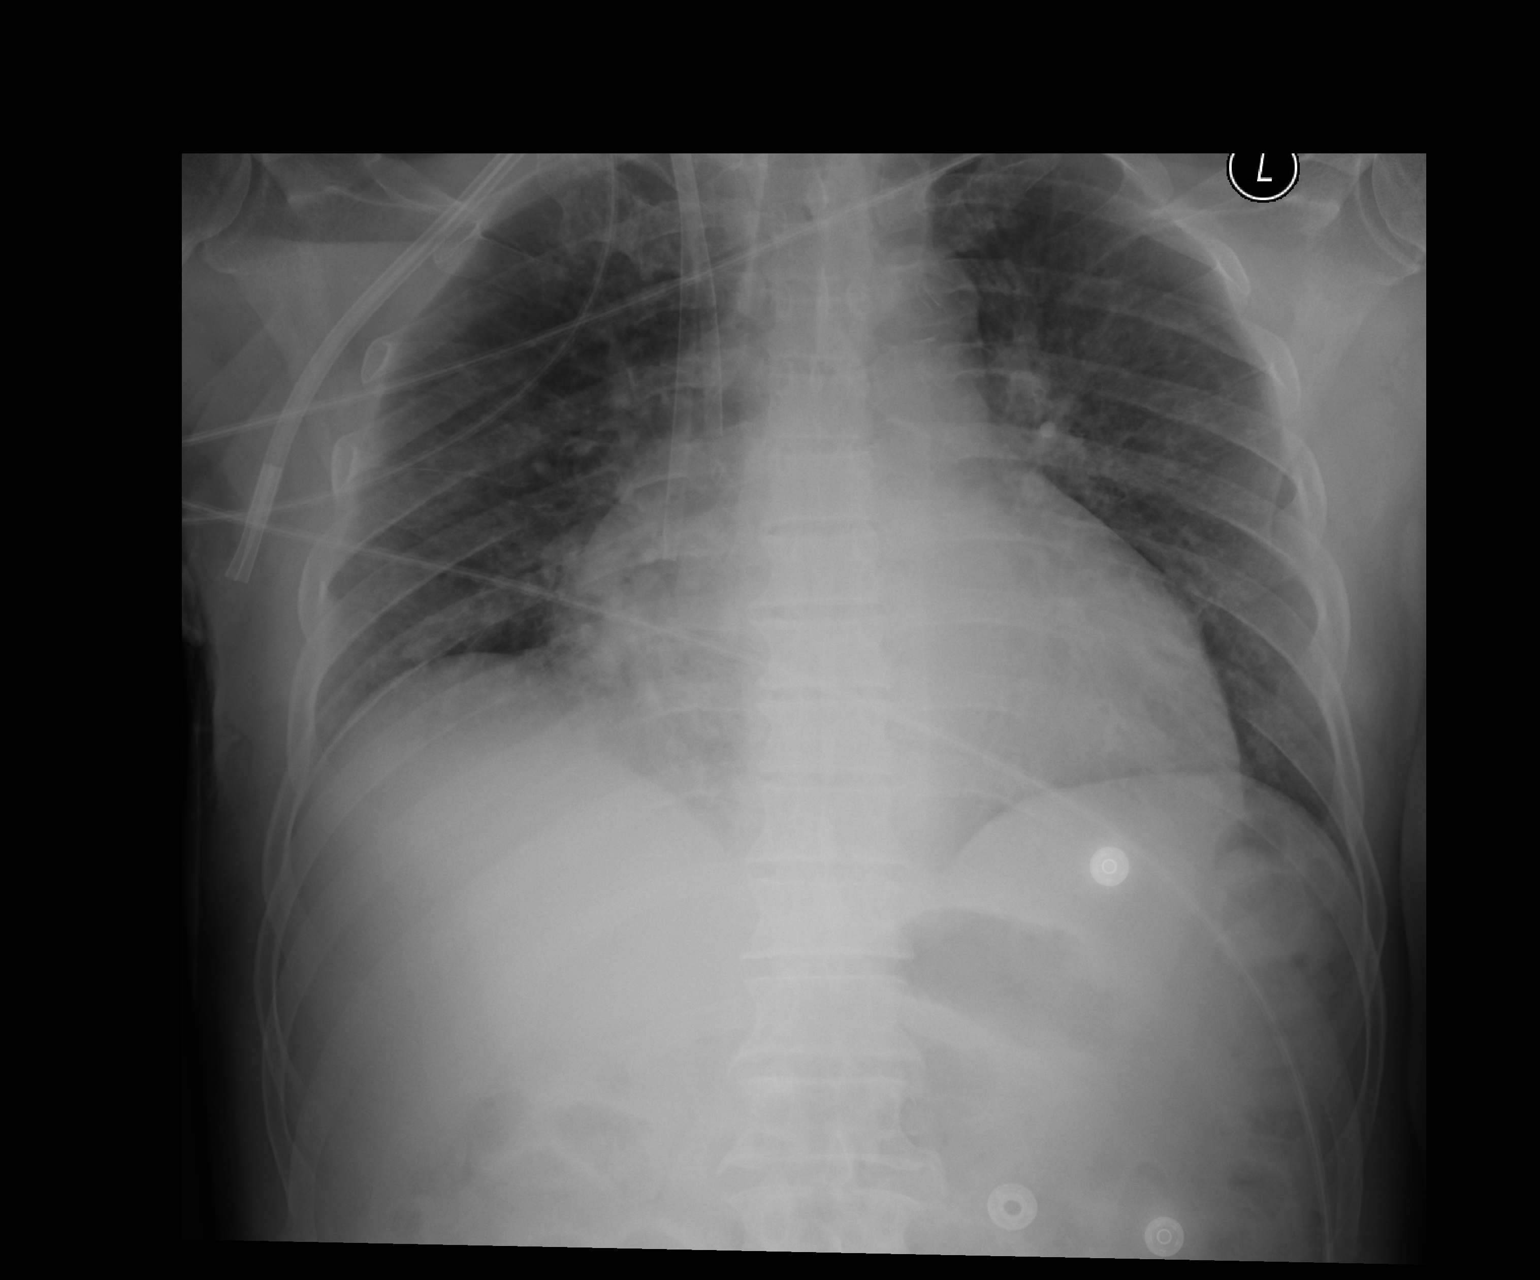

[1 of 1 positions shown; findings below may reference images not displayed]

FINDINGS: Right dialysis catheter is in place with the tip in the upper right
atrium. No pneumothorax. There are low lung volumes with
cardiomegaly and vascular congestion. No overt edema. No effusions.
No acute bony abnormality.
IMPRESSION: Right dialysis catheter tip in the upper right atrium without
pneumothorax. Low lung volumes with cardiomegaly and vascular
congestion.

## 2016-01-03 IMAGING — CR DG ABDOMEN 2V
2 series · 2 of 2 positions shown · non-contrast
Comparison: None.

CLINICAL DATA: Constipation.

EXAM:
ABDOMEN - 2 VIEW

[abdomen erect]
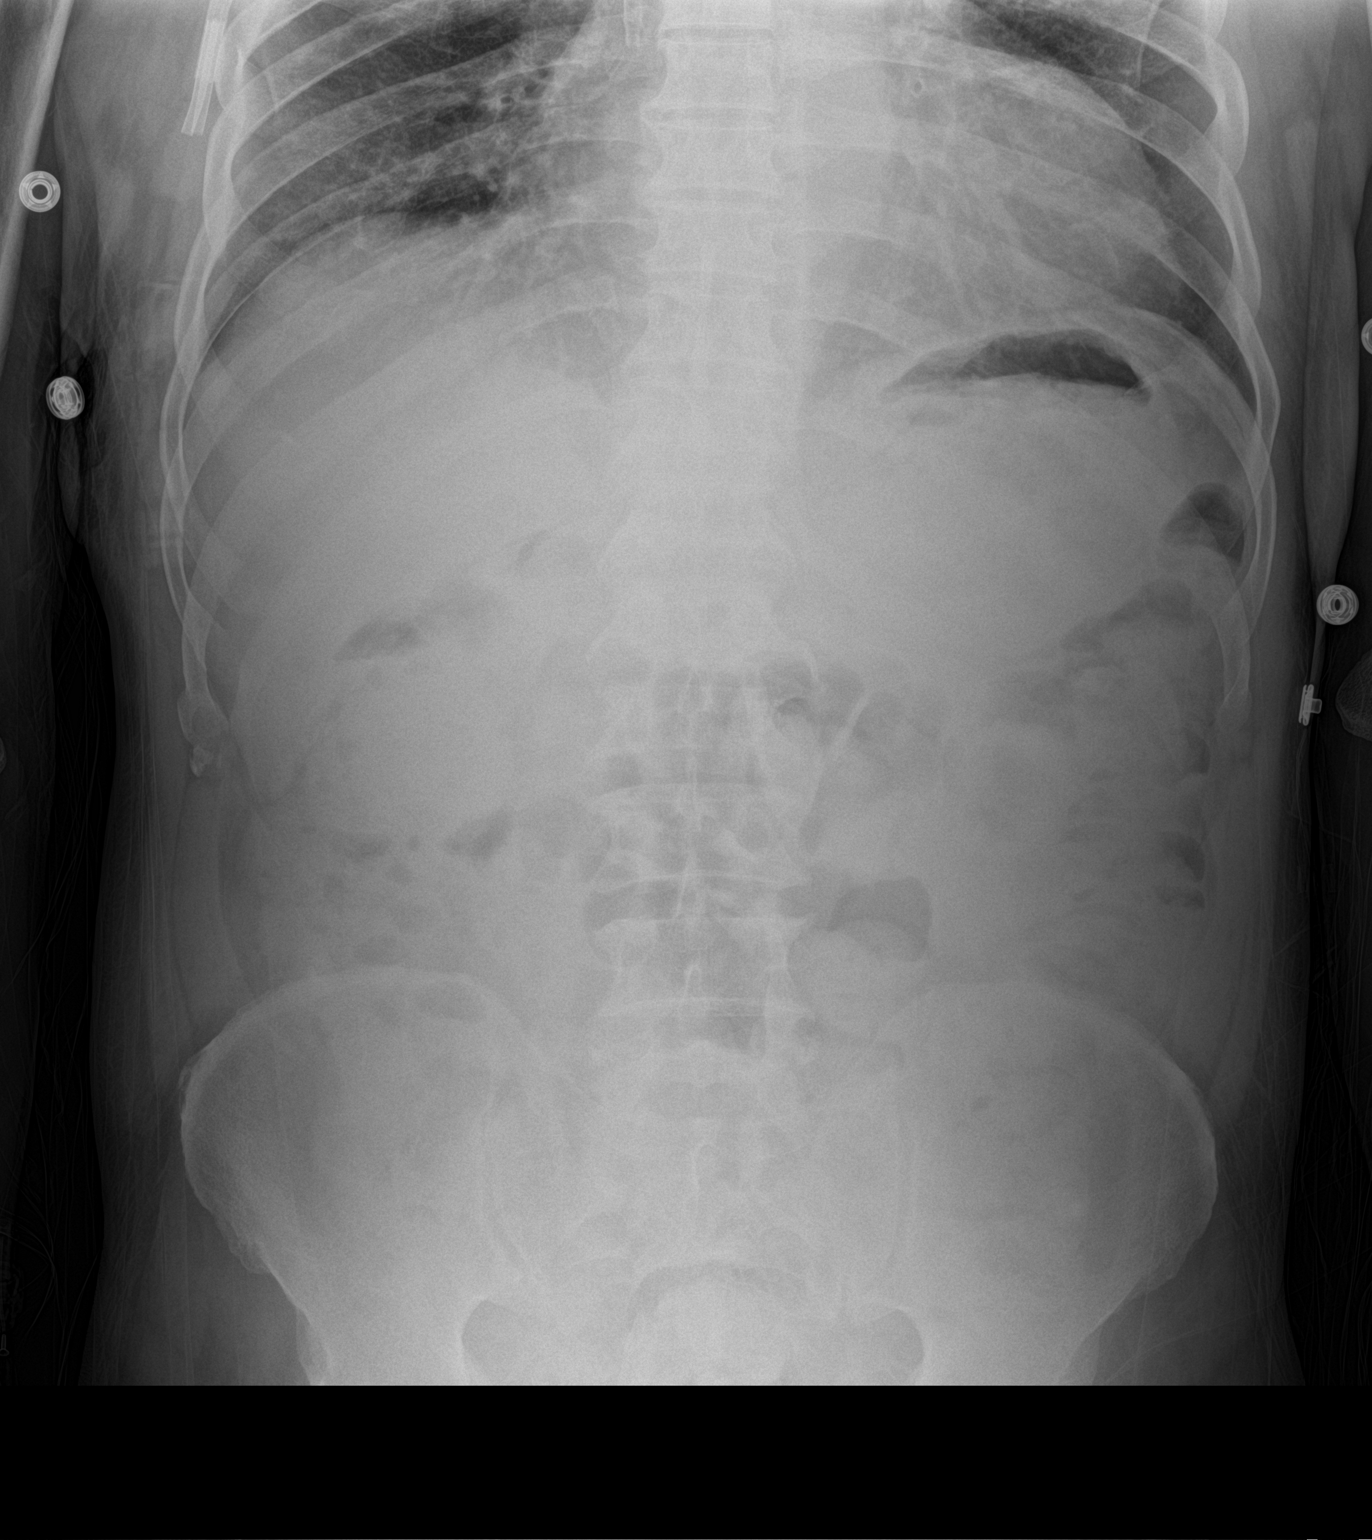

[abdomen supine]
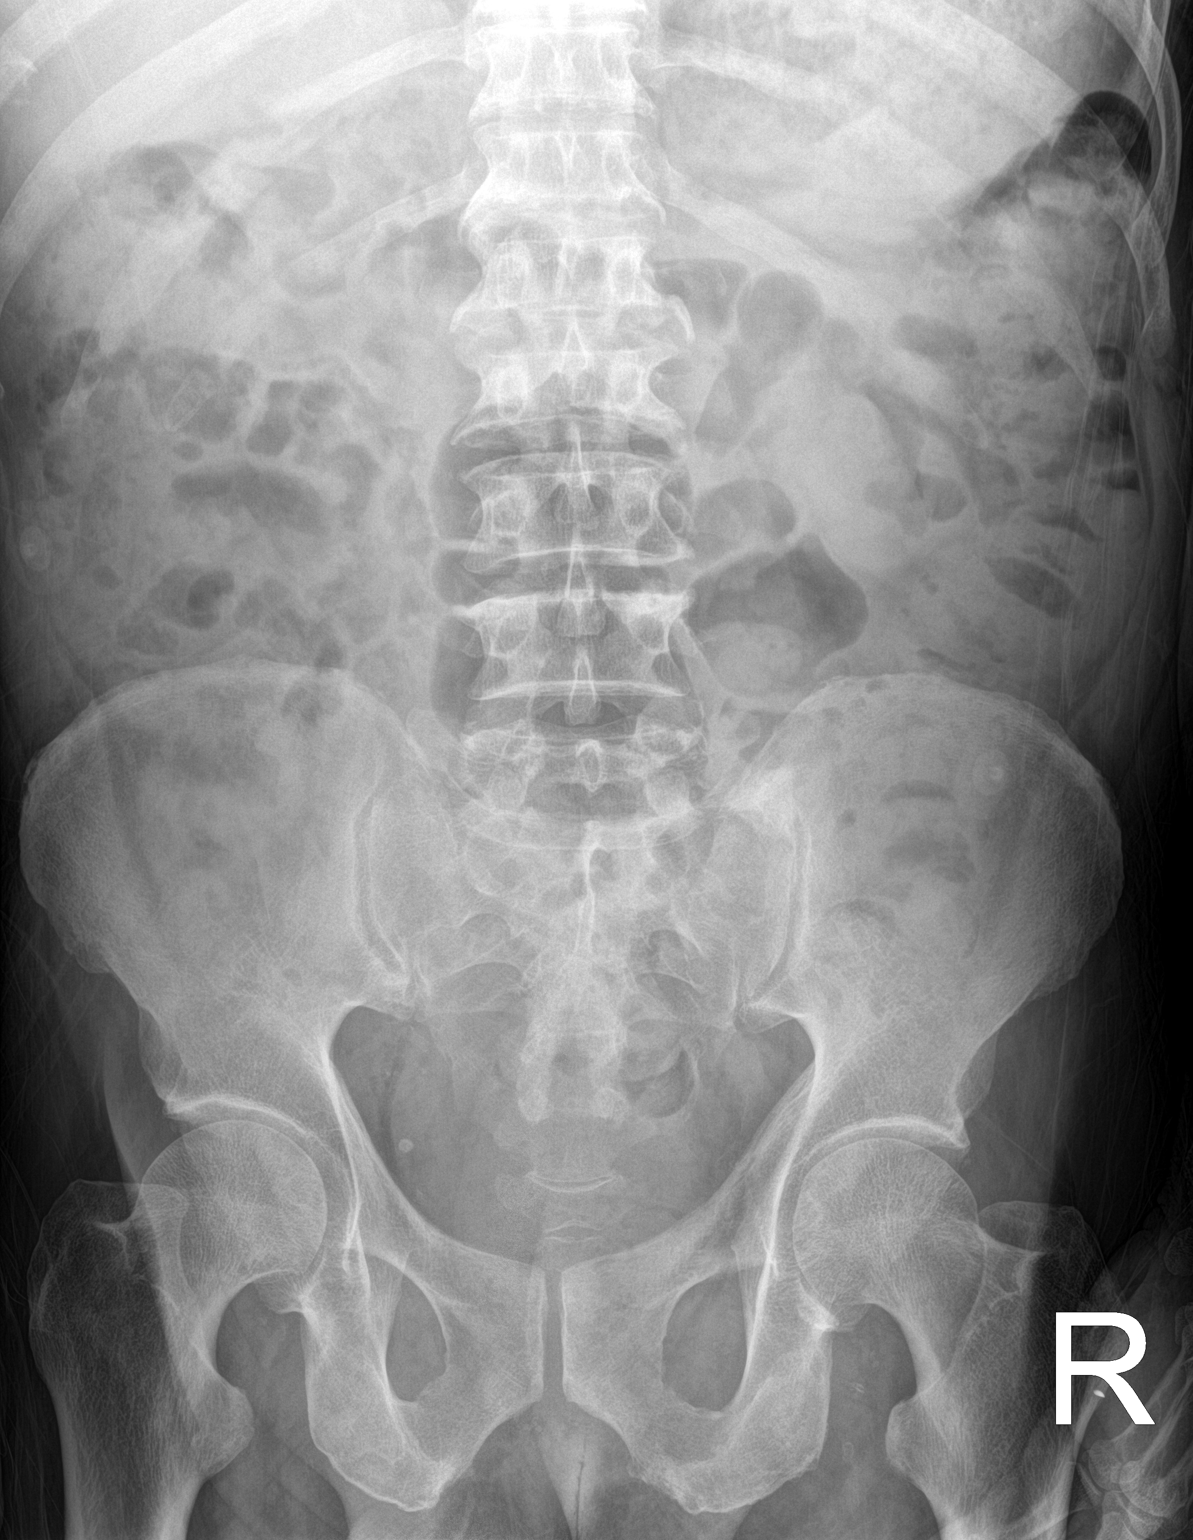

[2 of 2 positions shown; findings below may reference images not displayed]

FINDINGS: The bowel gas pattern is normal. There is no evidence of free air.
No radio-opaque calculi or other significant radiographic
abnormality is seen.
IMPRESSION: Negative.

## 2016-01-15 ENCOUNTER — Ambulatory Visit: Payer: Self-pay | Admitting: Family Medicine

## 2016-01-18 ENCOUNTER — Encounter: Payer: Self-pay | Admitting: Family Medicine

## 2016-01-18 ENCOUNTER — Ambulatory Visit: Payer: Self-pay | Attending: Family Medicine | Admitting: Family Medicine

## 2016-01-18 VITALS — BP 156/69 | HR 83 | Temp 97.8°F | Resp 16 | Ht 62.0 in | Wt 140.0 lb

## 2016-01-18 DIAGNOSIS — Z87891 Personal history of nicotine dependence: Secondary | ICD-10-CM | POA: Insufficient documentation

## 2016-01-18 DIAGNOSIS — R739 Hyperglycemia, unspecified: Secondary | ICD-10-CM

## 2016-01-18 DIAGNOSIS — I10 Essential (primary) hypertension: Secondary | ICD-10-CM | POA: Insufficient documentation

## 2016-01-18 DIAGNOSIS — D649 Anemia, unspecified: Secondary | ICD-10-CM | POA: Insufficient documentation

## 2016-01-18 DIAGNOSIS — D6489 Other specified anemias: Secondary | ICD-10-CM

## 2016-01-18 DIAGNOSIS — E1169 Type 2 diabetes mellitus with other specified complication: Secondary | ICD-10-CM

## 2016-01-18 DIAGNOSIS — Z79899 Other long term (current) drug therapy: Secondary | ICD-10-CM | POA: Insufficient documentation

## 2016-01-18 DIAGNOSIS — IMO0002 Reserved for concepts with insufficient information to code with codable children: Secondary | ICD-10-CM

## 2016-01-18 DIAGNOSIS — E1165 Type 2 diabetes mellitus with hyperglycemia: Secondary | ICD-10-CM | POA: Insufficient documentation

## 2016-01-18 DIAGNOSIS — E785 Hyperlipidemia, unspecified: Secondary | ICD-10-CM

## 2016-01-18 DIAGNOSIS — E118 Type 2 diabetes mellitus with unspecified complications: Secondary | ICD-10-CM

## 2016-01-18 LAB — POCT URINALYSIS DIPSTICK
BILIRUBIN UA: NEGATIVE
Glucose, UA: 100
KETONES UA: NEGATIVE
Leukocytes, UA: NEGATIVE
Nitrite, UA: NEGATIVE
PH UA: 8.5
Protein, UA: 100
RBC UA: NEGATIVE
Spec Grav, UA: 1.015
Urobilinogen, UA: 0.2

## 2016-01-18 LAB — BASIC METABOLIC PANEL WITH GFR
BUN: 47 mg/dL — AB (ref 7–25)
CALCIUM: 9 mg/dL (ref 8.6–10.3)
CO2: 29 mmol/L (ref 20–31)
CREATININE: 6.82 mg/dL — AB (ref 0.70–1.25)
Chloride: 89 mmol/L — ABNORMAL LOW (ref 98–110)
GFR, EST AFRICAN AMERICAN: 9 mL/min — AB (ref >=60)
GFR, EST NON AFRICAN AMERICAN: 8 mL/min — AB (ref >=60)
GLUCOSE: 334 mg/dL — AB (ref 65–99)
Potassium: 4.8 mmol/L (ref 3.5–5.3)
Sodium: 131 mmol/L — ABNORMAL LOW (ref 135–146)

## 2016-01-18 LAB — CBC
HEMATOCRIT: 34.6 % — AB (ref 38.5–50.0)
Hemoglobin: 11.4 g/dL — ABNORMAL LOW (ref 13.2–17.1)
MCH: 32.7 pg (ref 27.0–33.0)
MCHC: 32.9 g/dL (ref 32.0–36.0)
MCV: 99.1 fL (ref 80.0–100.0)
MPV: 11 fL (ref 7.5–12.5)
PLATELETS: 293 10*3/uL (ref 140–400)
RBC: 3.49 MIL/uL — ABNORMAL LOW (ref 4.20–5.80)
RDW: 15.3 % — AB (ref 11.0–15.0)
WBC: 5.3 10*3/uL (ref 3.8–10.8)

## 2016-01-18 LAB — LIPID PANEL
CHOL/HDL RATIO: 5.5 ratio — AB (ref <=5.0)
CHOLESTEROL: 191 mg/dL (ref 125–200)
HDL: 35 mg/dL — AB (ref >=40)
LDL Cholesterol: 96 mg/dL (ref <130)
TRIGLYCERIDES: 299 mg/dL — AB (ref <150)
VLDL: 60 mg/dL — AB (ref <30)

## 2016-01-18 LAB — GLUCOSE, POCT (MANUAL RESULT ENTRY): POC Glucose: 325 mg/dl — AB (ref 70–99)

## 2016-01-18 MED ORDER — INSULIN ASPART 100 UNIT/ML ~~LOC~~ SOLN: 10.0000 [IU] | Freq: Once | SUBCUTANEOUS | Status: DC

## 2016-01-18 MED ORDER — AMLODIPINE BESYLATE 2.5 MG PO TABS: ORAL_TABLET | ORAL | Status: DC

## 2016-01-18 NOTE — Progress Notes (Signed)
Subjective:  Patient ID: Brian Fuller, male    DOB: August 15, 1951  Age: 65 y.o. MRN: [id number]  Spanish interpreter used ID # [id number] CC: Diabetes   [name] presents with his close family friend who speaks English for diabetes, ESRD-DD TTS   1. CHRONIC DIABETES, he also has end stage renal disease. He is not taking oral medications or insulin. 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 HTN: stopped norvasc 1 year ago. No HA, CP, SOB or leg swelling. No cramping in calves.   Social History  Substance Use Topics  . Smoking status: Former Smoker    Quit date: 01/17/1984  . Smokeless tobacco: Never Used  . Alcohol Use: No    Outpatient Prescriptions Prior to Visit  Medication Sig Dispense Refill  . amLODipine (NORVASC) 5 MG tablet Take 1 tablet (5 mg total) by mouth every Monday, Wednesday, and Friday. (Patient taking differently: Take 2.5 mg by mouth every Monday, Wednesday, and Friday. ) 30 tablet 0  . calcium acetate (PHOSLO) 667 MG capsule Take 1 capsule (667 mg total) by mouth 3 (three) times daily with meals. 90 capsule 0  . Cyanocobalamin (VITAMIN B 12 PO) Take by mouth every evening.    Marland Kitchen oxyCODONE-acetaminophen (ROXICET) 5-325 MG per tablet Take 1-2 tablets by mouth every 4 (four) hours as needed for moderate pain. 30 tablet 0   No facility-administered medications prior to visit.    ROS Review of Systems  Constitutional: Negative for fever, chills, fatigue and  unexpected weight change.  Eyes: Negative for visual disturbance.  Respiratory: Negative for cough and shortness of breath.   Cardiovascular: Negative for chest pain, palpitations and leg swelling.  Gastrointestinal: Negative for nausea, vomiting, abdominal pain, diarrhea, constipation and blood in stool.  Endocrine: Negative for polydipsia, polyphagia and polyuria.  Musculoskeletal: Negative for myalgias, back pain, arthralgias, gait problem and neck pain.  Skin: Negative for rash.  Allergic/Immunologic: Negative for immunocompromised state.  Neurological: Positive for numbness (in feet and L hand ).  Hematological: Negative for adenopathy. Does not bruise/bleed easily.  Psychiatric/Behavioral: Negative for suicidal ideas, sleep disturbance and dysphoric mood. The patient is not nervous/anxious.     Objective:  BP 156/69 mmHg  Pulse 83  Temp(Src) 97.8 F (36.6 C) (Oral)  Resp 16  Ht 5\' 2"  (1.575 m)  SpO2 98%  BP/Weight 01/18/2016 08/07/2015 [id number]  Systolic BP [id number]  Diastolic BP 69 48 63  Wt. (Lbs) - 138.23 136  BMI - 24.49 24.87   Physical Exam  Constitutional: He appears well-developed and well-nourished. No distress.  HENT:  Head: Normocephalic and atraumatic.  Neck: Normal range of motion. Neck supple.  Cardiovascular: Normal rate, regular rhythm, normal heart sounds and intact distal pulses.   L upper arm AV fistula with palpable thrill   Pulmonary/Chest: Effort normal and breath sounds normal.  Musculoskeletal: He exhibits no edema.  Neurological: He is alert.  Skin: Skin is warm and dry. No rash noted. No erythema.  Psychiatric: He has a normal mood and affect.   CBG 325  patient refused insulin for hyperglycemia  Lab Results  Component Value Date   HGBA1C 10.8 02/08/2015   A1c 8.6 today      Assessment & Plan:   Jrake was seen today for diabetes.  Diagnoses and all orders for this visit:  Uncontrolled type 2 diabetes mellitus with  complication, without long-term current use of insulin (HCC) -     HgB A1c -     Glucose (CBG) -     Lipid Panel -     BASIC METABOLIC PANEL WITH GFR -     POCT urinalysis dipstick  Hyperglycemia -     Discontinue: insulin aspart (novoLOG) injection 10 Units; Inject 0.1 mLs (10 Units total) into the skin once.  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   Meds ordered this encounter  Medications  . insulin aspart (novoLOG) injection 10 Units    Sig:     Follow-up: No Follow-up on file.   Boykin Nearing MD

## 2016-01-18 NOTE — Assessment & Plan Note (Signed)
Hyperglycemia today, patient refused insulin treatment. This elevated CBG is an outlier per patient. Plan to increase exercise and reduce sugar intake. Goal A1c < 8 F/u in 3 months Plan for low dose lantus 5 U at f/u if A1c  >8

## 2016-01-18 NOTE — Assessment & Plan Note (Signed)
BP elevated Add back norvasc 2.5 mg prn BP > 140/90

## 2016-01-18 NOTE — Patient Instructions (Addendum)
Duane was seen today for diabetes.  Diagnoses and all orders for this visit:  Uncontrolled type 2 diabetes mellitus with complication, without long-term current use of insulin (HCC) -     HgB A1c -     Glucose (CBG) -     Lipid Panel -     BASIC METABOLIC PANEL WITH GFR -     POCT urinalysis dipstick  Hyperglycemia -     Discontinue: insulin aspart (novoLOG) injection 10 Units; Inject 0.1 mLs (10 Units total) into the skin once.  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   F/u in 3 months for HTN and diabetes   Dr. Adrian Blackwater

## 2016-01-18 NOTE — Progress Notes (Signed)
F/U DM HTN  Not taking medication x 1 year per last hospital visit  Elevated glucose today pt refused Insulin Tx  No pain today  Former tobacco user  No suicidal thoughts in the past two weeks

## 2016-01-19 MED ORDER — ATORVASTATIN CALCIUM 40 MG PO TABS: 40.0000 mg | ORAL_TABLET | Freq: Every day | ORAL | Status: DC

## 2016-01-19 NOTE — Addendum Note (Signed)
Addended by: Boykin Nearing on: 01/19/2016 08:35 AM   Modules accepted: Orders

## 2016-01-22 ENCOUNTER — Telehealth: Payer: Self-pay | Admitting: *Deleted

## 2016-01-22 NOTE — Telephone Encounter (Signed)
-----   Message from Boykin Nearing, MD sent at 01/19/2016  8:34 AM EDT ----- Mild anemia Elevated cholesterol, start lipitor 40 mg daily Electrolyte abnormalities consistent with ESRD-DD

## 2016-01-22 NOTE — Telephone Encounter (Signed)
Date of birth verified by pt  Lab results given  Mild Anemia, elevated cholesterol, start Lipitor 40 mg daily  Rx send to [facility]  Electrolyte abnormalities consistent with ESRD-DD Pt verbalized understanding  Pt stated if any future question Sister [place] Crane will call back  Information given in Spanish

## 2016-02-07 ENCOUNTER — Other Ambulatory Visit: Payer: Self-pay | Admitting: Pharmacist

## 2016-02-07 DIAGNOSIS — I10 Essential (primary) hypertension: Secondary | ICD-10-CM

## 2016-02-07 MED ORDER — AMLODIPINE BESYLATE 2.5 MG PO TABS: ORAL_TABLET | ORAL | Status: DC

## 2016-02-14 ENCOUNTER — Other Ambulatory Visit: Payer: Self-pay | Admitting: Pharmacist

## 2016-02-14 DIAGNOSIS — I10 Essential (primary) hypertension: Secondary | ICD-10-CM

## 2016-02-14 MED ORDER — AMLODIPINE BESYLATE 2.5 MG PO TABS: ORAL_TABLET | ORAL | Status: DC

## 2016-12-12 DIAGNOSIS — H2512 Age-related nuclear cataract, left eye: Secondary | ICD-10-CM | POA: Insufficient documentation

## 2017-01-06 DIAGNOSIS — E1122 Type 2 diabetes mellitus with diabetic chronic kidney disease: Secondary | ICD-10-CM | POA: Insufficient documentation

## 2018-08-26 ENCOUNTER — Other Ambulatory Visit (HOSPITAL_COMMUNITY): Payer: Self-pay | Admitting: Nephrology

## 2018-08-26 DIAGNOSIS — N186 End stage renal disease: Secondary | ICD-10-CM

## 2018-08-26 DIAGNOSIS — Z992 Dependence on renal dialysis: Principal | ICD-10-CM

## 2018-09-03 ENCOUNTER — Other Ambulatory Visit: Payer: Self-pay | Admitting: Radiology

## 2018-09-04 ENCOUNTER — Telehealth (HOSPITAL_COMMUNITY): Payer: Self-pay | Admitting: *Deleted

## 2018-09-04 ENCOUNTER — Encounter (HOSPITAL_COMMUNITY): Payer: Self-pay

## 2018-09-04 ENCOUNTER — Ambulatory Visit (HOSPITAL_COMMUNITY)
Admission: RE | Admit: 2018-09-04 | Discharge: 2018-09-04 | Disposition: A | Discharge status: Home / Self Care | Payer: Self-pay | Source: Ambulatory Visit | Attending: Nephrology | Admitting: Nephrology

## 2018-09-04 DIAGNOSIS — Z992 Dependence on renal dialysis: Secondary | ICD-10-CM | POA: Insufficient documentation

## 2018-09-04 DIAGNOSIS — N186 End stage renal disease: Secondary | ICD-10-CM | POA: Insufficient documentation

## 2018-09-04 LAB — GLUCOSE, CAPILLARY: Glucose-Capillary: 147 mg/dL — ABNORMAL HIGH (ref 70–99)

## 2018-09-04 MED ORDER — SODIUM CHLORIDE 0.9 % IV SOLN: INTRAVENOUS | Status: DC

## 2018-09-04 NOTE — Telephone Encounter (Signed)
Called and informed dilaysis that patient refused fistula gram.

## 2018-09-04 NOTE — Progress Notes (Signed)
Pt voices that he doesn't want to have procedure now Jannifer Franklin, PA-C informed

## 2018-09-04 NOTE — H&P (Signed)
Chief Complaint: Patient was seen in consultation today for left arm fistulogram with possible intervention at the request of [place]  Referring Physician(s): Corliss Parish  Supervising Physician: Aletta Edouard  Patient Status: [facility] - Out-pt  History of Present Illness: Brian Fuller is a 68 y.o. male   ESRD Left arm pain after dialysis for last few weeks Placed 2016 per chart Dr Moshe Cipro asking for evaluation  Last dialysis yesterday No issue; complete Pt denies knowledge of any issues with fistula   Past Medical History:  Diagnosis Date  . Anemia   . Chronic kidney disease    dialysis, T/Th/Sat  . Diabetes mellitus without complication ([place])   . Diabetic retinopathy ([place])   . Glaucoma   . Hypertension   . 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 Pneumonia     Past Surgical History:  Procedure Laterality Date  . AV FISTULA PLACEMENT Left 11/02/2014   Procedure: ARTERIOVENOUS (AV) FISTULA CREATION;  Surgeon: Mal Misty, MD;  Location: [place];  Service: Vascular;  Laterality: Left;  . AV FISTULA PLACEMENT Left 04/26/2015   Procedure: LEFT BRACHIOCEPHALIC ARTERIOVENOUS (AV) FISTULA CREATION;  Surgeon: Conrad Nanuet, MD;  Location: [place];  Service: Vascular;  Laterality: Left;  . EYE SURGERY    . INSERTION OF DIALYSIS CATHETER Right 11/04/2014   Procedure: INSERTION OF DIALYSIS CATHETER RIGHT INTERNAL JUGULAR VEIN;  Surgeon: Mal Misty, MD;  Location: [place];  Service: Vascular;  Laterality: Right;  . LIGATION OF ARTERIOVENOUS  FISTULA Left 04/26/2015   Procedure: LIGATION OF LEFT RADIOCEPHALIC ARTERIOVENOUS  FISTULA;  Surgeon: Conrad Spalding, MD;  Location: [place];  Service: Vascular;  Laterality: Left;  . REVISON OF ARTERIOVENOUS FISTULA Left 01/18/2015   Procedure: REVISON OF LEFT RADIOCEPHALIC ARTERIOVENOUS FISTULA USING VASCU-GUARD PERIPHERAL VASCULAR PATCH ;  Surgeon: Mal Misty, MD;  Location: [place];  Service: Vascular;  Laterality:  Left;  . TONSILLECTOMY      Allergies: Patient has no known allergies.  Medications: Prior to Admission medications   Medication Sig Start Date End Date Taking? Authorizing Provider  calcium acetate (PHOSLO) 667 MG capsule Take 1 capsule (667 mg total) by mouth 3 (three) times daily with meals. 11/10/14  Yes Bonnielee Haff, MD  cinacalcet (SENSIPAR) 30 MG tablet Take 30 mg by mouth daily.   Yes [provider]  Cyanocobalamin (VITAMIN B 12 PO) Take 1 tablet by mouth every evening.    Yes [provider]  amLODipine (NORVASC) 2.5 MG tablet Take one tablet on M-W-F-Sundays if blood pressure over 140/90. Patient not taking: Reported on 09/04/2018 02/14/16   Boykin Nearing, MD  atorvastatin (LIPITOR) 40 MG tablet Take 1 tablet (40 mg total) by mouth daily. Patient not taking: Reported on 09/04/2018 01/19/16   Boykin Nearing, MD     Family History  Problem Relation Age of Onset  . Diabetes Mother   . Diabetes Father   . Diabetes Brother   . Hypertension Sister     Social History   Socioeconomic History  . Marital status: Married    Spouse name: Not on file  . Number of children: Not on file  . Years of education: Not on file  . Highest education level: Not on file  Occupational History  . Not on file  Social Needs  . Financial resource strain: Not on file  . Food insecurity:    Worry: Not on file    Inability: Not on file  . Transportation  needs:    Medical: Not on file    Non-medical: Not on file  Tobacco Use  . Smoking status: Former Smoker    Last attempt to quit: 01/17/1984    Years since quitting: 34.6  . Smokeless tobacco: Never Used  Substance and Sexual Activity  . Alcohol use: No    Alcohol/week: 0.0 standard drinks  . Drug use: No  . Sexual activity: Not on file  Lifestyle  . Physical activity:    Days per week: Not on file    Minutes per session: Not on file  . Stress: Not on file  Relationships  . Social connections:    Talks on  phone: Not on file    Gets together: Not on file    Attends religious service: Not on file    Active member of club or organization: Not on file    Attends meetings of clubs or organizations: Not on file    Relationship status: Not on file  Other Topics Concern  . Not on file  Social History Narrative  . Not on file    Review of Systems: A 12 point ROS discussed and pertinent positives are indicated in the HPI above.  All other systems are negative.  Review of Systems  Constitutional: Negative for activity change, fatigue and fever.  Respiratory: Negative for cough and shortness of breath.   Cardiovascular: Negative for chest pain.  Gastrointestinal: Negative for abdominal pain.  Musculoskeletal: Negative for back pain.  Neurological: Negative for weakness.  Psychiatric/Behavioral: Negative for behavioral problems and confusion.    Vital Signs: BP (!) 189/79   Pulse 87   Temp 98.3 F (36.8 C) (Oral)   Ht 5\' 2"  (1.575 m)   Wt 146 lb 9.7 oz (66.5 kg)   SpO2 98%   BMI 26.81 kg/m   Physical Exam Vitals signs reviewed.  Cardiovascular:     Rate and Rhythm: Normal rate and regular rhythm.     Heart sounds: Normal heart sounds.  Pulmonary:     Effort: Pulmonary effort is normal.     Breath sounds: Normal breath sounds.  Abdominal:     General: Bowel sounds are normal.     Palpations: Abdomen is soft.  Musculoskeletal: Normal range of motion.     Comments: left arm fistula has good pulse and thrill  Skin:    General: Skin is warm and dry.  Neurological:     Mental Status: He is oriented to person, place, and time.  Psychiatric:        Mood and Affect: Mood normal.        Behavior: Behavior normal.        Thought Content: Thought content normal.        Judgment: Judgment normal.     Comments: Consented through Interpreter [phone]     Imaging: No results found.  Labs:  CBC: No results for input(s): WBC, HGB, HCT, PLT in the last 8760 hours.  COAGS: No  results for input(s): INR, APTT in the last 8760 hours.  BMP: No results for input(s): NA, K, CL, CO2, GLUCOSE, BUN, CALCIUM, CREATININE, GFRNONAA, GFRAA in the last 8760 hours.  Invalid input(s): CMP  LIVER FUNCTION TESTS: No results for input(s): BILITOT, AST, ALT, ALKPHOS, PROT, ALBUMIN in the last 8760 hours.  TUMOR MARKERS: No results for input(s): AFPTM, CEA, CA199, CHROMGRNA in the last 8760 hours.  Assessment and Plan:  Scheduled for left arm fisulogram with possible intervention Denies issues or pain  After  long discussion with pt; interpreter and English speaking friend in room---- Pt has decided AGAINST moving forward with this procedure  Thank you for this interesting consult.  I greatly enjoyed meeting [facility] and look forward to participating in their care.  A copy of this report was sent to the requesting provider on this date.  Electronically Signed: Lavonia Drafts, PA-C 09/04/2018, 11:38 AM   I spent a total of  30 Minutes   in face to face in clinical consultation, greater than 50% of which was counseling/coordinating care for left arm fistulogram

## 2018-09-04 NOTE — Progress Notes (Signed)
Interpretor line used [place] [phone] to interpret interview questions and to explain procedure by Jannifer Franklin, PA-C

## 2019-03-25 DIAGNOSIS — E113512 Type 2 diabetes mellitus with proliferative diabetic retinopathy with macular edema, left eye: Secondary | ICD-10-CM | POA: Insufficient documentation

## 2021-07-20 DIAGNOSIS — H5440 Blindness, one eye, unspecified eye: Secondary | ICD-10-CM | POA: Insufficient documentation

## 2021-07-20 DIAGNOSIS — E039 Hypothyroidism, unspecified: Secondary | ICD-10-CM | POA: Insufficient documentation

## 2021-07-25 ENCOUNTER — Telehealth: Payer: Self-pay

## 2021-07-25 NOTE — Telephone Encounter (Signed)
NOTES SCANNED TO REFERRAL 

## 2021-09-04 ENCOUNTER — Inpatient Hospital Stay (HOSPITAL_COMMUNITY)
Admission: EM | Admit: 2021-09-04 | Discharge: 2021-09-07 | DRG: 617 | Disposition: A | Discharge status: Home / Self Care | Payer: Self-pay | Attending: Family Medicine | Admitting: Family Medicine

## 2021-09-04 ENCOUNTER — Encounter (HOSPITAL_COMMUNITY): Payer: Self-pay | Admitting: Emergency Medicine

## 2021-09-04 ENCOUNTER — Emergency Department (HOSPITAL_COMMUNITY): Payer: Self-pay

## 2021-09-04 ENCOUNTER — Other Ambulatory Visit: Payer: Self-pay

## 2021-09-04 DIAGNOSIS — M869 Osteomyelitis, unspecified: Principal | ICD-10-CM | POA: Diagnosis present

## 2021-09-04 DIAGNOSIS — Z992 Dependence on renal dialysis: Secondary | ICD-10-CM

## 2021-09-04 DIAGNOSIS — N2581 Secondary hyperparathyroidism of renal origin: Secondary | ICD-10-CM | POA: Diagnosis present

## 2021-09-04 DIAGNOSIS — E1122 Type 2 diabetes mellitus with diabetic chronic kidney disease: Secondary | ICD-10-CM | POA: Diagnosis present

## 2021-09-04 DIAGNOSIS — D631 Anemia in chronic kidney disease: Secondary | ICD-10-CM | POA: Diagnosis present

## 2021-09-04 DIAGNOSIS — M86172 Other acute osteomyelitis, left ankle and foot: Secondary | ICD-10-CM | POA: Diagnosis present

## 2021-09-04 DIAGNOSIS — Z79899 Other long term (current) drug therapy: Secondary | ICD-10-CM

## 2021-09-04 DIAGNOSIS — L97509 Non-pressure chronic ulcer of other part of unspecified foot with unspecified severity: Secondary | ICD-10-CM

## 2021-09-04 DIAGNOSIS — Z880 Allergy status to penicillin: Secondary | ICD-10-CM

## 2021-09-04 DIAGNOSIS — I10 Essential (primary) hypertension: Secondary | ICD-10-CM

## 2021-09-04 DIAGNOSIS — Z91199 Patient's noncompliance with other medical treatment and regimen due to unspecified reason: Secondary | ICD-10-CM

## 2021-09-04 DIAGNOSIS — E1169 Type 2 diabetes mellitus with other specified complication: Secondary | ICD-10-CM | POA: Diagnosis present

## 2021-09-04 DIAGNOSIS — E1139 Type 2 diabetes mellitus with other diabetic ophthalmic complication: Secondary | ICD-10-CM | POA: Diagnosis present

## 2021-09-04 DIAGNOSIS — E1165 Type 2 diabetes mellitus with hyperglycemia: Secondary | ICD-10-CM | POA: Diagnosis present

## 2021-09-04 DIAGNOSIS — N186 End stage renal disease: Secondary | ICD-10-CM | POA: Diagnosis present

## 2021-09-04 DIAGNOSIS — Z20822 Contact with and (suspected) exposure to covid-19: Secondary | ICD-10-CM | POA: Diagnosis present

## 2021-09-04 DIAGNOSIS — Z8249 Family history of ischemic heart disease and other diseases of the circulatory system: Secondary | ICD-10-CM

## 2021-09-04 DIAGNOSIS — M19072 Primary osteoarthritis, left ankle and foot: Secondary | ICD-10-CM | POA: Diagnosis present

## 2021-09-04 DIAGNOSIS — Z5989 Other problems related to housing and economic circumstances: Secondary | ICD-10-CM

## 2021-09-04 DIAGNOSIS — Z833 Family history of diabetes mellitus: Secondary | ICD-10-CM

## 2021-09-04 DIAGNOSIS — E11621 Type 2 diabetes mellitus with foot ulcer: Principal | ICD-10-CM | POA: Diagnosis present

## 2021-09-04 DIAGNOSIS — E785 Hyperlipidemia, unspecified: Secondary | ICD-10-CM | POA: Diagnosis present

## 2021-09-04 DIAGNOSIS — L600 Ingrowing nail: Secondary | ICD-10-CM | POA: Diagnosis present

## 2021-09-04 DIAGNOSIS — E1152 Type 2 diabetes mellitus with diabetic peripheral angiopathy with gangrene: Secondary | ICD-10-CM | POA: Diagnosis present

## 2021-09-04 DIAGNOSIS — Z87891 Personal history of nicotine dependence: Secondary | ICD-10-CM

## 2021-09-04 DIAGNOSIS — R52 Pain, unspecified: Secondary | ICD-10-CM

## 2021-09-04 DIAGNOSIS — I152 Hypertension secondary to endocrine disorders: Secondary | ICD-10-CM | POA: Diagnosis present

## 2021-09-04 DIAGNOSIS — E1159 Type 2 diabetes mellitus with other circulatory complications: Secondary | ICD-10-CM | POA: Diagnosis present

## 2021-09-04 DIAGNOSIS — L97529 Non-pressure chronic ulcer of other part of left foot with unspecified severity: Secondary | ICD-10-CM | POA: Diagnosis present

## 2021-09-04 DIAGNOSIS — H547 Unspecified visual loss: Secondary | ICD-10-CM | POA: Diagnosis present

## 2021-09-04 LAB — CBC WITH DIFFERENTIAL/PLATELET
Abs Immature Granulocytes: 0.02 10*3/uL (ref 0.00–0.07)
Basophils Absolute: 0 10*3/uL (ref 0.0–0.1)
Basophils Relative: 0 %
Eosinophils Absolute: 0.1 10*3/uL (ref 0.0–0.5)
Eosinophils Relative: 2 %
HCT: 31.2 % — ABNORMAL LOW (ref 39.0–52.0)
Hemoglobin: 10.6 g/dL — ABNORMAL LOW (ref 13.0–17.0)
Immature Granulocytes: 0 %
Lymphocytes Relative: 16 %
Lymphs Abs: 1 10*3/uL (ref 0.7–4.0)
MCH: 32.1 pg (ref 26.0–34.0)
MCHC: 34 g/dL (ref 30.0–36.0)
MCV: 94.5 fL (ref 80.0–100.0)
Monocytes Absolute: 0.5 10*3/uL (ref 0.1–1.0)
Monocytes Relative: 9 %
Neutro Abs: 4.4 10*3/uL (ref 1.7–7.7)
Neutrophils Relative %: 73 %
Platelets: 253 10*3/uL (ref 150–400)
RBC: 3.3 MIL/uL — ABNORMAL LOW (ref 4.22–5.81)
RDW: 12.9 % (ref 11.5–15.5)
WBC: 6.1 10*3/uL (ref 4.0–10.5)
nRBC: 0 % (ref 0.0–0.2)

## 2021-09-04 LAB — COMPREHENSIVE METABOLIC PANEL
ALT: 38 U/L (ref 0–44)
AST: 35 U/L (ref 15–41)
Albumin: 3.1 g/dL — ABNORMAL LOW (ref 3.5–5.0)
Alkaline Phosphatase: 113 U/L (ref 38–126)
Anion gap: 12 (ref 5–15)
BUN: 19 mg/dL (ref 8–23)
CO2: 32 mmol/L (ref 22–32)
Calcium: 8.4 mg/dL — ABNORMAL LOW (ref 8.9–10.3)
Chloride: 95 mmol/L — ABNORMAL LOW (ref 98–111)
Creatinine, Ser: 4 mg/dL — ABNORMAL HIGH (ref 0.61–1.24)
GFR, Estimated: 15 mL/min — ABNORMAL LOW (ref >60)
Glucose, Bld: 180 mg/dL — ABNORMAL HIGH (ref 70–99)
Potassium: 3.8 mmol/L (ref 3.5–5.1)
Sodium: 139 mmol/L (ref 135–145)
Total Bilirubin: 0.6 mg/dL (ref 0.3–1.2)
Total Protein: 7.1 g/dL (ref 6.5–8.1)

## 2021-09-04 LAB — LACTIC ACID, PLASMA
Lactic Acid, Venous: 1.4 mmol/L (ref 0.5–1.9)
Lactic Acid, Venous: 1.1 mmol/L (ref 0.5–1.9)

## 2021-09-04 LAB — RESP PANEL BY RT-PCR (FLU A&B, COVID) ARPGX2
Influenza A by PCR: NEGATIVE
Influenza B by PCR: NEGATIVE
SARS Coronavirus 2 by RT PCR: NEGATIVE

## 2021-09-04 MED ORDER — HYDRALAZINE HCL 25 MG PO TABS
25.0000 mg | ORAL_TABLET | Freq: Three times a day (TID) | ORAL | Status: DC
  Administered 2021-09-04 – 2021-09-05 (×4): 25 mg via ORAL
  Filled 2021-09-04 (×5): qty 1

## 2021-09-04 MED ORDER — AMLODIPINE BESYLATE 5 MG PO TABS
5.0000 mg | ORAL_TABLET | Freq: Every day | ORAL | Status: DC
  Administered 2021-09-04 – 2021-09-07 (×3): 5 mg via ORAL
  Filled 2021-09-04 (×3): qty 1

## 2021-09-04 MED ORDER — ACETAMINOPHEN 650 MG RE SUPP: 650.0000 mg | Freq: Four times a day (QID) | RECTAL | Status: DC | PRN

## 2021-09-04 MED ORDER — VANCOMYCIN HCL 500 MG/100ML IV SOLN
500.0000 mg | Freq: Once | INTRAVENOUS | Status: AC
  Administered 2021-09-04: 500 mg via INTRAVENOUS
  Filled 2021-09-04: qty 100

## 2021-09-04 MED ORDER — HEPARIN SODIUM (PORCINE) 5000 UNIT/ML IJ SOLN
5000.0000 [IU] | Freq: Three times a day (TID) | INTRAMUSCULAR | Status: DC
  Administered 2021-09-04 – 2021-09-07 (×8): 5000 [IU] via SUBCUTANEOUS
  Filled 2021-09-04 (×8): qty 1

## 2021-09-04 MED ORDER — ACETAMINOPHEN 325 MG PO TABS
650.0000 mg | ORAL_TABLET | Freq: Four times a day (QID) | ORAL | Status: DC | PRN
  Administered 2021-09-05 – 2021-09-06 (×2): 650 mg via ORAL
  Filled 2021-09-04 (×2): qty 2

## 2021-09-04 MED ORDER — VANCOMYCIN HCL IN DEXTROSE 1-5 GM/200ML-% IV SOLN
1000.0000 mg | Freq: Once | INTRAVENOUS | Status: AC
  Administered 2021-09-04: 1000 mg via INTRAVENOUS
  Filled 2021-09-04: qty 200

## 2021-09-04 MED ORDER — SODIUM CHLORIDE 0.9 % IV SOLN
1.0000 g | INTRAVENOUS | Status: DC
  Administered 2021-09-04 – 2021-09-06 (×3): 1 g via INTRAVENOUS
  Filled 2021-09-04 (×4): qty 1

## 2021-09-04 NOTE — ED Triage Notes (Signed)
Patient here for evaluation of wound on distal phalanx second toe on left foot, history of CKD on dialysis and diabetes. Patient filed down the nail on the two a few weeks ago and this wound appeared fifteen days ago.

## 2021-09-04 NOTE — Consult Note (Signed)
ESRD Consult Note    Assessment/Recommendations:  # ESRD:  -outpatient orders: [place], TTS.  2K, 2 Cal, 400/500, EDW 64 kg, LUE AVF, no heparin, hectorol 33mcg qtreatment -next HD planned for Thursday  # Left second toe osteomyleitis -ortho on board, tentatively planning for partial toe amputation tomorrow -abx per primary service, currently on vanc/cefepime  # Volume/ hypertension: EDW 64kg. Has been dialyzing to under EDW, will need new  EDW with next treatment. Resume home-anti HTNs, will UF as tolerated  # Anemia of Chronic Kidney Disease: Hemoglobin 10.6. Brian Fuller, has not received this dose as an outpatient. Can start this if Hgb <10. Avoid IV iron for now (of note, just completed iron load as an outpatient)  # Secondary Hyperparathyroidism/Hyperphosphatemia: check phos, resume home binders  #DM2 w/ hyperglycemia -per primary service  # Vascular access: LUE AVF +b/t  # Additional recommendations: - Dose all meds for creatinine clearance < 10 ml/min  - Unless absolutely necessary, no MRIs with gadolinium.  - Implement save arm precautions.  Prefer needle sticks in the dorsum of the hands or wrists.  No blood pressure measurements in arm. - If blood transfusion is requested during hemodialysis sessions, please alert [place] prior to the session.  - If a hemodialysis catheter line culture is requested, please alert [place] as only hemodialysis nurses are able to collect those specimens.   Recommendations were discussed with the primary team.   History of Present Illness: Brian Fuller is a/an 71 y.o. male with a past medical history of ESRD on HD TTS, hypertension, DM 2, diabetic retinopathy, glaucoma who presents with left toe wound after trying to cut his nails.  Had completed his dialysis earlier today.  Has been dialyzing to under his dry weight which currently is in the process of being adjusted. He does not report any foot pain currently nor any issues with AVF or  HD.   Medications:  Current Facility-Administered Medications  Medication Dose Route Frequency Provider Last Rate Last Admin   acetaminophen (TYLENOL) tablet 650 mg  650 mg Oral Q6H PRN Alen Bleacher, MD       Or   acetaminophen (TYLENOL) suppository 650 mg  650 mg Rectal Q6H PRN Alen Bleacher, MD       amLODipine (NORVASC) tablet 5 mg  5 mg Oral Daily Alen Bleacher, MD   5 mg at 09/04/21 1947   ceFEPIme (MAXIPIME) 1 g in sodium chloride 0.9 % 100 mL IVPB  1 g Intravenous Q24H Alen Bleacher, MD   Stopped at 09/04/21 1839   heparin injection 5,000 Units  5,000 Units Subcutaneous Q8H Alen Bleacher, MD       hydrALAZINE (APRESOLINE) tablet 25 mg  25 mg Oral Q8H Sowell, Erlene Quan, MD       vancomycin (VANCOREADY) IVPB 500 mg/100 mL  500 mg Intravenous Once Alen Bleacher, MD       Current Outpatient Medications  Medication Sig Dispense Refill   amLODipine (NORVASC) 2.5 MG tablet Take one tablet on M-W-F-Sundays if blood pressure over 140/90. (Patient not taking: Reported on 09/04/2018) 30 tablet 11   atorvastatin (LIPITOR) 40 MG tablet Take 1 tablet (40 mg total) by mouth daily. (Patient not taking: Reported on 09/04/2018) 90 tablet 3   calcium acetate (PHOSLO) 667 MG capsule Take 1 capsule (667 mg total) by mouth 3 (three) times daily with meals. 90 capsule 0   cinacalcet (SENSIPAR) 30 MG tablet Take 30 mg by mouth daily.     Cyanocobalamin (VITAMIN B 12 PO)  Take 1 tablet by mouth every evening.        ALLERGIES Penicillins  MEDICAL HISTORY Past Medical History:  Diagnosis Date   Anemia    Chronic kidney disease    dialysis, T/Th/Sat   Diabetes mellitus without complication (HCC)    Diabetic retinopathy ([place])    ESRD (end stage renal disease) ([place])    Glaucoma    Hypertension    Hyperthyroidism    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    Pneumonia    Systolic murmur      SOCIAL HISTORY Social History   Socioeconomic History   Marital status: Married    Spouse name: Not on file    Number of children: Not on file   Years of education: Not on file   Highest education level: Not on file  Occupational History   Not on file  Tobacco Use   Smoking status: Former    Types: Cigarettes    Quit date: 01/17/1984    Years since quitting: 37.6   Smokeless tobacco: Never  Substance and Sexual Activity   Alcohol use: No    Alcohol/week: 0.0 standard drinks   Drug use: No   Sexual activity: Not on file  Other Topics Concern   Not on file  Social History Narrative   Not on file   Social Determinants of Health   Financial Resource Strain: Not on file  Food Insecurity: Not on file  Transportation Needs: Not on file  Physical Activity: Not on file  Stress: Not on file  Social Connections: Not on file  Intimate Partner Violence: Not on file     FAMILY HISTORY Family History  Problem Relation Age of Onset   Diabetes Mother    Diabetes Father    Diabetes Brother    Hypertension Sister      Review of Systems: 12 systems were reviewed and negative except per HPI  Physical Exam: Vitals:   09/04/21 1710 09/04/21 1809  BP: (!) 204/84 (!) 195/83  Pulse: 80 73  Resp: 18 16  Temp:    SpO2: 100% 98%   No intake/output data recorded.  Intake/Output Summary (Last 24 hours) at 09/04/2021 2053 Last data filed at 09/04/2021 1839 Gross per 24 hour  Intake 300 ml  Output --  Net 300 ml   General: well-appearing, no acute distress. Laying flat in bed HEENT: anicteric sclera, MMM CV: normal rate, no murmurs, no edema Lungs: bilateral chest rise, normal wob Abd: soft, non-tender, non-distended Skin: no visible lesions or rashes Ext: left 2nd toe wound Psych: alert, engaged, appropriate mood and affect Neuro: normal speech, no gross focal deficits  Dialysis accessL LUE AVF +b/t  Test Results Reviewed Lab Results  Component Value Date   NA 139 09/04/2021   K 3.8 09/04/2021   CL 95 (L) 09/04/2021   CO2 32 09/04/2021   BUN 19 09/04/2021   CREATININE 4.00  (H) 09/04/2021   CALCIUM 8.4 (L) 09/04/2021   ALBUMIN 3.1 (L) 09/04/2021   PHOS 4.9 (H) 11/04/2014    I have reviewed relevant outside healthcare records

## 2021-09-04 NOTE — Progress Notes (Addendum)
Pharmacy Antibiotic Note  Brian Fuller is a 71 y.o. male admitted on 09/04/2021 with suspected bacteremia and cellulitis.  PMH is significant for ESRD on dialysis (TTS schedule). Pharmacy has been consulted for cefepime dosing.  WBC is 6.1, patient currently afebrile.   Plan: Cefepime 1g IV q24h Vancomycin 1g IV  x1 per MD Follow-up cultures Monitor for signs of clinical improvement  ADDENDUM: Pharmacy consulted for vancomycin dosing for osteomyelitis of left second toe. PMH significant for ESRD on TTS schedule outpatient. Surgery consulted and plan is for partial second toe amputation.  Plan: Cefepime 1g IV q24h Vancomycin 500 mg IV x1 to complete 1500 mg loading dose Vancomycin 750 mg IV with dialysis Follow-up HD schedule Follow-up cultures  Elita Quick, PharmD PGY1 Ambulatory Care Pharmacy Resident 09/04/2021 6:46 PM     Temp (24hrs), Avg:98.9 F (37.2 C), Min:98.9 F (37.2 C), Max:98.9 F (37.2 C)  Recent Labs  Lab 09/04/21 1320  WBC 6.1  CREATININE 4.00*  LATICACIDVEN 1.4    CrCl cannot be calculated (Unknown ideal weight.).    Allergies  Allergen Reactions   Penicillins Rash    Antimicrobials this admission: 1/17 vancomycin >>  1/17 cefepime >>   Dose adjustments this admission: none  Microbiology results: 1/17 BCx: pending  Thank you for involving pharmacy in this patient's care.  Elita Quick, PharmD PGY1 Ambulatory Care Pharmacy Resident 09/04/2021 6:44 PM  **Pharmacist phone directory can be found on [email or web address] listed under [place]**

## 2021-09-04 NOTE — Progress Notes (Signed)
Patient is a diabetic with end-stage renal disease on dialysis who presents with an infected open wound of the left second toe. Anticipate patient will need partial second toe amputation. Tentatively posted for surgery 1/18. Please make patient NPO after midnight. Full consult note to follow.

## 2021-09-04 NOTE — ED Provider Notes (Signed)
[facility] EMERGENCY DEPARTMENT Provider Note   CSN: [id number] Arrival date & time: 09/04/21  1210     History  Chief Complaint  Patient presents with   Wound Check    Brian Fuller is a 71 y.o. male With past medical history significant for ESRD on dialysis, diabetes who presents with concern for wound on the second toe of the left foot.  Patient reports that he filed down the nail around 4 weeks ago secondary to ingrown toenail, and he has had a wound for around 15 days.  Patient denies significant fever, shortness of breath, chills or pain of the foot.  Patient rates his pain 5/10 at this time.  Patient reports that he has applied some topical antibiotic ointments but is not taking anything by mouth or had any previous evaluation.  Patient has not seen a podiatrist for this problem or in the past.   Wound Check      Home Medications Prior to Admission medications   Medication Sig Start Date End Date Taking? Authorizing Provider  amLODipine (NORVASC) 2.5 MG tablet Take one tablet on M-W-F-Sundays if blood pressure over 140/90. Patient not taking: Reported on 09/04/2018 02/14/16   Boykin Nearing, MD  atorvastatin (LIPITOR) 40 MG tablet Take 1 tablet (40 mg total) by mouth daily. Patient not taking: Reported on 09/04/2018 01/19/16   Boykin Nearing, MD  calcium acetate (PHOSLO) 667 MG capsule Take 1 capsule (667 mg total) by mouth 3 (three) times daily with meals. 11/10/14   Bonnielee Haff, MD  cinacalcet (SENSIPAR) 30 MG tablet Take 30 mg by mouth daily.    [provider]  Cyanocobalamin (VITAMIN B 12 PO) Take 1 tablet by mouth every evening.     [provider]      Allergies    Penicillins    Review of Systems   Review of Systems  Skin:  Positive for wound.  All other systems reviewed and are negative.  Physical Exam Updated Vital Signs BP (!) 151/76 (BP Location: Right Arm)    Pulse 78    Temp 98.9 F (37.2 C) (Oral)     Resp 17    SpO2 99%  Physical Exam Vitals and nursing note reviewed.  Constitutional:      General: He is not in acute distress.    Appearance: Normal appearance.  HENT:     Head: Normocephalic and atraumatic.  Eyes:     General:        Right eye: No discharge.        Left eye: No discharge.  Cardiovascular:     Rate and Rhythm: Normal rate and regular rhythm.  Pulmonary:     Effort: Pulmonary effort is normal. No respiratory distress.  Musculoskeletal:        General: No deformity.     Comments: Patient with no significant tenderness to palpation of the proximal second phalanx of the left foot, or MTP of the left foot.  No tracking redness extending from the toe to the body of the left foot.  Skin:    General: Skin is warm and dry.     Comments: There is a dark, indurated, eroded wound on the very distal aspect of the second toe of the left foot.  Please see wound photos for further information.  There is macerated, peeling skin with raw pink skin underneath.  No purulent drainage noted.  Neurological:     Mental Status: He is alert and oriented to person, place,  and time.  Psychiatric:        Mood and Affect: Mood normal.        Behavior: Behavior normal.        ED Results / Procedures / Treatments   Labs (all labs ordered are listed, but only abnormal results are displayed) Labs Reviewed  COMPREHENSIVE METABOLIC PANEL - Abnormal; Notable for the following components:      Result Value   Chloride 95 (*)    Glucose, Bld 180 (*)    Creatinine, Ser 4.00 (*)    Calcium 8.4 (*)    Albumin 3.1 (*)    GFR, Estimated 15 (*)    All other components within normal limits  CBC WITH DIFFERENTIAL/PLATELET - Abnormal; Notable for the following components:   RBC 3.30 (*)    Hemoglobin 10.6 (*)    HCT 31.2 (*)    All other components within normal limits  CULTURE, BLOOD (ROUTINE X 2)  CULTURE, BLOOD (ROUTINE X 2)  RESP PANEL BY RT-PCR (FLU A&B, COVID) ARPGX2  LACTIC ACID,  PLASMA  LACTIC ACID, PLASMA  URINALYSIS, ROUTINE W REFLEX MICROSCOPIC    EKG None  Radiology DG Foot Complete Left  Result Date: 09/04/2021 CLINICAL DATA:  Left foot pain for 15 days. Second toe discolored. Diabetic. EXAM: LEFT FOOT - COMPLETE 3+ VIEW COMPARISON:  None. FINDINGS: Vascular calcifications are noted. Mild joint space narrowing of the interphalangeal joints diffusely. Mild dorsal talonavicular degenerative osteophytosis. No acute fracture is seen. No dislocation. No cortical erosion is seen. No soft tissue ulcer is identified. IMPRESSION: 1. No cortical erosion or acute fracture is seen. 2. Mild osteoarthritis as above. Electronically Signed   By: Yvonne Kendall M.D.   On: 09/04/2021 13:57    Procedures Procedures    Medications Ordered in ED Medications  vancomycin (VANCOCIN) IVPB 1000 mg/200 mL premix (has no administration in time range)    ED Course/ Medical Decision Making/ A&P Clinical Course as of 09/04/21 1648  Tue Sep 04, 2021  1618 Ainsley Spinner ortho will consult -- NPO at midnight. Plan to admit medicine, will begin antibiotics. [CP]  1638 Spoke with Hospitalist team who agrees to admit [CP]    Clinical Course User Index [CP] Anselmo Pickler, PA-C                           Medical Decision Making Amount and/or Complexity of Data Reviewed Labs: ordered. Radiology: ordered.  Risk Prescription drug management.   I discussed this case with my attending physician who cosigned this note including patient's presenting symptoms, physical exam, and planned diagnostics and interventions. Attending physician stated agreement with plan or made changes to plan which were implemented.   Attending physician assessed patient at bedside.  Is a patient with significant risk factors for poorly healing foot wounds including diabetes, peripheral vascular disease secondary to ESRD who presents with a wound on the distal phalanx of his second toe on the left foot.   Patient reports he felt the nail off around 3 to 4 weeks ago, with wound formed for the last 15 days.  He has applied some topical antibiotic ointment, has not taken anything by mouth.  Patient does not see podiatry.  I ordered and reviewed lab work including CMP which is significant for elevated blood glucose of 180.  Patient's creatinine is stable high at 4.00.  He reports he is up-to-date on his dialysis.  CBC is remarkable for mild anemia  with a hemoglobin of 10.6, no white count.  Patient is afebrile.  He is mildly hypertensive with systolic of 161.  Personally ordered and reviewed radiographic imaging of the foot which shows no evidence of erosion, no free air, no plain radiographic evidence of osteomyelitis.  Clinically on my exam there is a macerated, indurated, poorly healing wound with possible bone visualized at the tip of the toe.  There is no tracking redness, or extension of the pain into the MTP.  High clinical suspicion for early or developing osteomyelitis.  Consulted with [place] with orthopedics who agrees to see patient after admission to hospital service. Spoke with hospitalist who agrees to admission at this time. Vanc/Cefepime initiated, patient informed of plan. Final Clinical Impression(s) / ED Diagnoses Final diagnoses:  Pain    Rx / DC Orders ED Discharge Orders     None         Anselmo Pickler, PA-C 09/04/21 1648    Drenda Freeze, MD 09/04/21 [phone]

## 2021-09-04 NOTE — Progress Notes (Signed)
FPTS Brief Progress Note  S:Went to see patient in ED, patient was sitting in bed comfortably, talking on the phone.    O: BP (!) 195/83 (BP Location: Right Arm)    Pulse 73    Temp 98.9 F (37.2 C) (Oral)    Resp 16    SpO2 98%     A/P: Patient BP HTN, will start antihypertensive. Given pulse in 70-80's, and pharmacy consult, will elect for hydralazine.  -Hydralazine 25 mg, q8h - Orders reviewed. Labs for AM ordered, which was adjusted as needed.   Holley Bouche, MD 09/04/2021, 8:29 PM PGY-1, [facility] Night Resident  Please page [phone] with questions.

## 2021-09-04 NOTE — ED Provider Triage Note (Signed)
Emergency Medicine Provider Triage Evaluation Note  Brian Fuller , a 71 y.o. male  was evaluated in triage.  Pt complains of wound check.  Patient has a wound on his second toe at the distal phalanx on the left foot.  He has history of CKD on dialysis, diabetes.  Patient states that he filed down the nail 2 weeks ago and the wound appeared shortly after.  Spanish interpreter used in triage  Review of Systems  Positive: Toe wound Negative: Fever, chills  Physical Exam  BP (!) 151/76 (BP Location: Right Arm)    Pulse 78    Temp 98.9 F (37.2 C) (Oral)    Resp 17    SpO2 99%  Gen:   Awake, no distress   Resp:  Normal effort  MSK:   Moves extremities without difficulty  Other:  Area of necrosis at the tip of the left second toe, with generalized erythema and what appears to be beginning of sloughing of the skin  Medical Decision Making  Medically screening exam initiated at 1:03 PM.  Appropriate orders placed.  Brian Fuller was informed that the remainder of the evaluation will be completed by another provider, this initial triage assessment does not replace that evaluation, and the importance of remaining in the ED until their evaluation is complete.  Will obtain labs, and imaging with concern for osteo Explained to patient he will most likely need to stay for IV antibiotics   Dollene Mallery T, PA-C 09/04/21 1309

## 2021-09-04 NOTE — H&P (Addendum)
[facility] Admission History and Physical Service Pager: [phone]  Patient name: Brian Fuller Medical record number: [id number] Date of birth: January 19, 1951 Age: 71 y.o. Gender: male  Primary Care Provider: Pcp, No Consultants: Ortho Code Status: Full Preferred Emergency Contact: Brian Fuller (spouse) [phone]  Chief Complaint: L foot wound   Assessment and Plan: Brian Fuller is a 72 y.o. male presenting with left second toe gangrene. PMH is significant for DM2, HTN, ESRD on HD  Left second toe gangrene   Osteomyelitis Patient arrived to the ED with nonhealing L second toe ulcer that has been going on for over 10 days.  Denies any significant tenderness on the wound. No chills or fever in the last few days.  No leukocytosis on initial labs, lactic acid and BMP were within normal limits aside from slightly decreased calcium of 8.4 and elevated creatinine of 4.0.  He remained afebrile on admission however appears to be hypertensive with systolic ranging from 856D-149F. Left foot x-ray showed mild dorsal talonavicular degenerative osteophytosis indicative of mild osteoarthritis. Patient was started on vancomycin and cefepime.  Orthopedic surgery was consulted for further assessment. There recommendation is appreciated.  - Admit to [place], attending Dr. McDiarmid - Ortho following, appreciate recommendations - Continue Vancomycin and cefepime -Continue routine vitals per floor -Follow-up blood culture -Follow-up urine analysis -NPO at midnight -Heparin for VTE prophylaxis  T2DM  Last A1c was 10.8 six years ago. Blood glucose on admission was 180.  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  Home medication includes Metformin and he is unaware of the dose he takes.  Reports taking 2 tablets twice daily. -Consider  restarting metformin after med rec - Moderate SSI - Nutrition consulted - CGBs q4hr -Follow-up A1c                     HTN BP range on admission 151/76. Currently 206/81. Denies headache. Asymptomtic, Denies taking any medication although chart review shows patient should be on amlodipine 2.5 mg daily. -Start amlodipine 5mg   - follow routine vitals  Anemia of chronic disease  Hgb on admission was 10.6 with MCV of 94.5.  Per chart review his last known hemoglobin 5 years ago was 11.4.  Chronic anemia is most likely related to his ESRD.  Exam symptomatic, denies shortness of breath or dizziness.  Continue to monitor his CBC closely. -Daily a.m. lab, CBC -Transfusion threshold <8   ESRD on HD (Tuesdays, Thursday, Saturday)  Admission patient's creatinine was Cr 4.0, GFR 15.  Per Chart review most recent known creatinine was 6.8  5-years ago.. Nephrology has been consulted for patient on dialysis.  Reports getting hemodialysis on Tuesdays, Thursdays and Saturday.  Patient got hemodialysis earlier today for arriving to [facility]. - Nephrology consulted, appreciate recommendations - HD per nephrology - Avoid nephrotoxic agents - Monitor BMP - Heparin for VTE prophylaxis  FEN/GI: N.p.o. at midnight Prophylaxis: Heparin  Disposition: Mid to [place]  History of Present Illness:  Brian Fuller is a 71 y.o. male presenting with L foot wound.   States he tried to cut his nail but instead cut part of his toe. He states it is hard for him to see because he has vision issues. States he cut his nail about 15 days ago. Noted skin changes about 10 days ago. Denies much pain, stating it only hurts  a little. Denies issues in the past with wound healing. He came in today because he said he saw his PCP first who recommended evaluation in the ED. Denies fever, chills, does have some numbness of big toe.   States he has diabetes, and was taking medication but he ran out about 8 days ago. States  prior to this he was taking Metformin daily  Dialysis Tues/ Thurs/ Sat. He had dialysis earlier today.   Review Of Systems: Per HPI with the following additions:   Review of Systems  Constitutional:  Negative for chills, diaphoresis, fatigue and fever.  Respiratory:  Negative for chest tightness, shortness of breath and stridor.   Cardiovascular:  Negative for chest pain, palpitations and leg swelling.  Endocrine: Negative for polydipsia, polyphagia and polyuria.  Skin:  Positive for color change and wound (left second toe). Negative for rash.    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, with macular edema, associated with type 1 diabetes mellitus 02/10/2014    Past Medical History: Past Medical History:  Diagnosis Date   Anemia    Chronic kidney disease    dialysis, T/Th/Sat   Diabetes mellitus without complication ([place])    Diabetic retinopathy ([place])    ESRD (end stage renal disease) ([place])    Glaucoma    Hypertension    Hyperthyroidism    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    Pneumonia    Systolic murmur     Past Surgical History: Past Surgical History:  Procedure Laterality Date   AV FISTULA PLACEMENT Left 11/02/2014   Procedure: ARTERIOVENOUS (AV) FISTULA CREATION;  Surgeon: Mal Misty, MD;  Location: [place];  Service: Vascular;  Laterality: Left;   AV FISTULA PLACEMENT Left 04/26/2015   Procedure: LEFT BRACHIOCEPHALIC ARTERIOVENOUS (AV) FISTULA CREATION;  Surgeon: Conrad Tallmadge, MD;  Location: [place];  Service: Vascular;  Laterality: Left;   EYE SURGERY     INSERTION OF DIALYSIS CATHETER Right 11/04/2014   Procedure: INSERTION OF  DIALYSIS CATHETER RIGHT INTERNAL JUGULAR VEIN;  Surgeon: Mal Misty, MD;  Location: [place];  Service: Vascular;  Laterality: Right;   LIGATION OF ARTERIOVENOUS  FISTULA Left 04/26/2015   Procedure: LIGATION OF LEFT RADIOCEPHALIC ARTERIOVENOUS  FISTULA;  Surgeon: Conrad Polk, MD;  Location: [place];  Service: Vascular;  Laterality: Left;   REVISON OF ARTERIOVENOUS FISTULA Left 01/18/2015   Procedure: REVISON OF LEFT RADIOCEPHALIC ARTERIOVENOUS FISTULA USING VASCU-GUARD PERIPHERAL VASCULAR PATCH ;  Surgeon: Mal Misty, MD;  [facility];  Service: Vascular;  Laterality: Left;   TONSILLECTOMY      Social History: Social History   Tobacco Use   Smoking status: Former    Types: Cigarettes    Quit date: 01/17/1984    Years since quitting: 37.6   Smokeless tobacco: Never  Substance Use Topics   Alcohol use: No    Alcohol/week: 0.0 standard drinks   Drug use: No   Additional social history:   Please also refer to relevant sections of EMR.  Family History: Family History  Problem Relation Age of Onset   Diabetes Mother    Diabetes Father    Diabetes Brother  Hypertension Sister      Allergies and Medications: Allergies  Allergen Reactions   Penicillins Rash   No current facility-administered medications on file prior to encounter.   Current Outpatient Medications on File Prior to Encounter  Medication Sig Dispense Refill   amLODipine (NORVASC) 2.5 MG tablet Take one tablet on M-W-F-Sundays if blood pressure over 140/90. (Patient not taking: Reported on 09/04/2018) 30 tablet 11   atorvastatin (LIPITOR) 40 MG tablet Take 1 tablet (40 mg total) by mouth daily. (Patient not taking: Reported on 09/04/2018) 90 tablet 3   calcium acetate (PHOSLO) 667 MG capsule Take 1 capsule (667 mg total) by mouth 3 (three) times daily with meals. 90 capsule 0   cinacalcet (SENSIPAR) 30 MG tablet Take 30 mg by mouth daily.     Cyanocobalamin (VITAMIN B 12 PO) Take 1 tablet by mouth every  evening.       Objective: BP (!) 151/76 (BP Location: Right Arm)    Pulse 78    Temp 98.9 F (37.2 C) (Oral)    Resp 17    SpO2 99%  Exam: General:Awake, well appearing, NAD HEENT: Atraumatic, MMM, No sclera icterus CV: RRR, no murmurs, normal S1/S2 Pulm: CTAB, good WOB on RA, no crackles or wheezing Abd: Soft, no distension, no tenderness Skin: dry, warm Ext: No BLE edema, sensation in tact in both feet, left second toe with swelling and gangrenous coloration.   Neuro: Oriented x3, No focal neuro deficit       Labs and Imaging: CBC BMET  Recent Labs  Lab 09/04/21 1320  WBC 6.1  HGB 10.6*  HCT 31.2*  PLT 253   Recent Labs  Lab 09/04/21 1320  NA 139  K 3.8  CL 95*  CO2 32  BUN 19  CREATININE 4.00*  GLUCOSE 180*  CALCIUM 8.4*     EKG: None  DG Foot Complete Left  Result Date: 09/04/2021 CLINICAL DATA:  Left foot pain for 15 days. Second toe discolored. Diabetic. EXAM: LEFT FOOT - COMPLETE 3+ VIEW COMPARISON:  None. FINDINGS: Vascular calcifications are noted. Mild joint space narrowing of the interphalangeal joints diffusely. Mild dorsal talonavicular degenerative osteophytosis. No acute fracture is seen. No dislocation. No cortical erosion is seen. No soft tissue ulcer is identified. IMPRESSION: 1. No cortical erosion or acute fracture is seen. 2. Mild osteoarthritis as above. Electronically Signed   By: Yvonne Kendall M.D.   On: 09/04/2021 13:57     Alen Bleacher, MD 09/04/2021, 4:38 PM PGY-1, [place] Intern pager: [phone], text pages welcome   FPTS Upper-Level Resident Addendum   I have independently interviewed and examined the patient. I have discussed the above with the original author and agree with their documentation. My edits for correction/addition/clarification are included. Please see also any attending notes.   Shary Key, D.O. PGY-2, [facility] 09/04/2021 6:55 PM  [place] Service pager: [phone]  (text pages welcome through [place])

## 2021-09-05 ENCOUNTER — Inpatient Hospital Stay (HOSPITAL_COMMUNITY): Payer: Self-pay | Admitting: Anesthesiology

## 2021-09-05 ENCOUNTER — Encounter (HOSPITAL_COMMUNITY): Payer: Self-pay | Admitting: *Deleted

## 2021-09-05 ENCOUNTER — Encounter (HOSPITAL_COMMUNITY)
Admission: EM | Disposition: A | Discharge status: Home / Self Care | Payer: Self-pay | Source: Home / Self Care | Attending: Family Medicine

## 2021-09-05 ENCOUNTER — Other Ambulatory Visit (HOSPITAL_COMMUNITY): Payer: Self-pay

## 2021-09-05 ENCOUNTER — Encounter (HOSPITAL_COMMUNITY): Payer: Self-pay | Admitting: Family Medicine

## 2021-09-05 DIAGNOSIS — Z5989 Other problems related to housing and economic circumstances: Secondary | ICD-10-CM

## 2021-09-05 DIAGNOSIS — E785 Hyperlipidemia, unspecified: Secondary | ICD-10-CM | POA: Diagnosis present

## 2021-09-05 DIAGNOSIS — E1169 Type 2 diabetes mellitus with other specified complication: Secondary | ICD-10-CM | POA: Diagnosis present

## 2021-09-05 HISTORY — PX: AMPUTATION TOE: SHX6595

## 2021-09-05 LAB — LIPID PANEL
Cholesterol: 123 mg/dL (ref 0–200)
HDL: 31 mg/dL — ABNORMAL LOW (ref >40)
LDL Cholesterol: 71 mg/dL (ref 0–99)
Total CHOL/HDL Ratio: 4 RATIO
Triglycerides: 104 mg/dL (ref <150)
VLDL: 21 mg/dL (ref 0–40)

## 2021-09-05 LAB — BASIC METABOLIC PANEL
Anion gap: 11 (ref 5–15)
BUN: 30 mg/dL — ABNORMAL HIGH (ref 8–23)
CO2: 30 mmol/L (ref 22–32)
Calcium: 8.6 mg/dL — ABNORMAL LOW (ref 8.9–10.3)
Chloride: 98 mmol/L (ref 98–111)
Creatinine, Ser: 5.38 mg/dL — ABNORMAL HIGH (ref 0.61–1.24)
GFR, Estimated: 11 mL/min — ABNORMAL LOW (ref >60)
Glucose, Bld: 175 mg/dL — ABNORMAL HIGH (ref 70–99)
Potassium: 4 mmol/L (ref 3.5–5.1)
Sodium: 139 mmol/L (ref 135–145)

## 2021-09-05 LAB — CBC
HCT: 29.6 % — ABNORMAL LOW (ref 39.0–52.0)
Hemoglobin: 10 g/dL — ABNORMAL LOW (ref 13.0–17.0)
MCH: 31.9 pg (ref 26.0–34.0)
MCHC: 33.8 g/dL (ref 30.0–36.0)
MCV: 94.6 fL (ref 80.0–100.0)
Platelets: 232 10*3/uL (ref 150–400)
RBC: 3.13 MIL/uL — ABNORMAL LOW (ref 4.22–5.81)
RDW: 13.1 % (ref 11.5–15.5)
WBC: 7 10*3/uL (ref 4.0–10.5)
nRBC: 0 % (ref 0.0–0.2)

## 2021-09-05 LAB — HIV ANTIBODY (ROUTINE TESTING W REFLEX): HIV Screen 4th Generation wRfx: NONREACTIVE

## 2021-09-05 LAB — HEPATITIS B SURFACE ANTIGEN: Hepatitis B Surface Ag: NONREACTIVE

## 2021-09-05 LAB — GLUCOSE, CAPILLARY
Glucose-Capillary: 87 mg/dL (ref 70–99)
Glucose-Capillary: 110 mg/dL — ABNORMAL HIGH (ref 70–99)

## 2021-09-05 LAB — PHOSPHORUS: Phosphorus: 6 mg/dL — ABNORMAL HIGH (ref 2.5–4.6)

## 2021-09-05 LAB — MRSA NEXT GEN BY PCR, NASAL: MRSA by PCR Next Gen: NOT DETECTED

## 2021-09-05 LAB — HEMOGLOBIN A1C
Hgb A1c MFr Bld: 7.9 % — ABNORMAL HIGH (ref 4.8–5.6)
Mean Plasma Glucose: 180.03 mg/dL

## 2021-09-05 SURGERY — AMPUTATION, TOE
Anesthesia: General | Site: Toe | Laterality: Left

## 2021-09-05 MED ORDER — HYDROMORPHONE HCL 1 MG/ML IJ SOLN: 0.2500 mg | INTRAMUSCULAR | Status: DC | PRN

## 2021-09-05 MED ORDER — ONDANSETRON HCL 4 MG/2ML IJ SOLN
INTRAMUSCULAR | Status: AC
  Filled 2021-09-05: qty 2

## 2021-09-05 MED ORDER — SODIUM CHLORIDE 0.9 % IV SOLN: INTRAVENOUS | Status: DC | PRN

## 2021-09-05 MED ORDER — CEFAZOLIN SODIUM-DEXTROSE 2-4 GM/100ML-% IV SOLN
2.0000 g | INTRAVENOUS | Status: AC
  Administered 2021-09-05: 2 g via INTRAVENOUS

## 2021-09-05 MED ORDER — OXYCODONE HCL 5 MG/5ML PO SOLN: 5.0000 mg | Freq: Once | ORAL | Status: DC | PRN

## 2021-09-05 MED ORDER — PHENYLEPHRINE 40 MCG/ML (10ML) SYRINGE FOR IV PUSH (FOR BLOOD PRESSURE SUPPORT)
PREFILLED_SYRINGE | INTRAVENOUS | Status: DC | PRN
  Administered 2021-09-05 (×5): 80 ug via INTRAVENOUS

## 2021-09-05 MED ORDER — PROPOFOL 10 MG/ML IV BOLUS
INTRAVENOUS | Status: DC | PRN
  Administered 2021-09-05: 200 mg via INTRAVENOUS

## 2021-09-05 MED ORDER — FENTANYL CITRATE (PF) 250 MCG/5ML IJ SOLN
INTRAMUSCULAR | Status: AC
  Filled 2021-09-05: qty 5

## 2021-09-05 MED ORDER — CHLORHEXIDINE GLUCONATE 0.12 % MT SOLN: 15.0000 mL | Freq: Once | OROMUCOSAL | Status: AC

## 2021-09-05 MED ORDER — ONDANSETRON HCL 4 MG/2ML IJ SOLN
INTRAMUSCULAR | Status: DC | PRN
  Administered 2021-09-05: 4 mg via INTRAVENOUS

## 2021-09-05 MED ORDER — VANCOMYCIN HCL 750 MG/150ML IV SOLN
750.0000 mg | INTRAVENOUS | Status: DC
  Administered 2021-09-06: 750 mg via INTRAVENOUS
  Filled 2021-09-05 (×2): qty 150

## 2021-09-05 MED ORDER — OXYCODONE HCL 5 MG PO TABS: 5.0000 mg | ORAL_TABLET | Freq: Once | ORAL | Status: DC | PRN

## 2021-09-05 MED ORDER — CHLORHEXIDINE GLUCONATE 0.12 % MT SOLN
OROMUCOSAL | Status: AC
  Administered 2021-09-05: 15 mL via OROMUCOSAL
  Filled 2021-09-05: qty 15

## 2021-09-05 MED ORDER — LACTATED RINGERS IV SOLN: INTRAVENOUS | Status: DC

## 2021-09-05 MED ORDER — BUPIVACAINE HCL 0.25 % IJ SOLN
INTRAMUSCULAR | Status: DC | PRN
Start: 2021-09-05 — End: 2021-09-05
  Administered 2021-09-05: 9 mL

## 2021-09-05 MED ORDER — SUGAMMADEX SODIUM 500 MG/5ML IV SOLN
INTRAVENOUS | Status: AC
  Filled 2021-09-05: qty 5

## 2021-09-05 MED ORDER — CEFAZOLIN SODIUM-DEXTROSE 2-4 GM/100ML-% IV SOLN
INTRAVENOUS | Status: AC
  Filled 2021-09-05: qty 100

## 2021-09-05 MED ORDER — CHLORHEXIDINE GLUCONATE 4 % EX LIQD: 60.0000 mL | Freq: Once | CUTANEOUS | Status: DC

## 2021-09-05 MED ORDER — AMISULPRIDE (ANTIEMETIC) 5 MG/2ML IV SOLN: 10.0000 mg | Freq: Once | INTRAVENOUS | Status: DC | PRN

## 2021-09-05 MED ORDER — DARBEPOETIN ALFA 60 MCG/0.3ML IJ SOSY
60.0000 ug | PREFILLED_SYRINGE | INTRAMUSCULAR | Status: DC
  Administered 2021-09-06: 60 ug via INTRAVENOUS
  Filled 2021-09-05: qty 0.3

## 2021-09-05 MED ORDER — ORAL CARE MOUTH RINSE: 15.0000 mL | Freq: Once | OROMUCOSAL | Status: AC

## 2021-09-05 MED ORDER — PROPOFOL 10 MG/ML IV BOLUS
INTRAVENOUS | Status: AC
  Filled 2021-09-05: qty 20

## 2021-09-05 MED ORDER — LIDOCAINE 2% (20 MG/ML) 5 ML SYRINGE
INTRAMUSCULAR | Status: DC | PRN
  Administered 2021-09-05: 60 mg via INTRAVENOUS

## 2021-09-05 MED ORDER — FENTANYL CITRATE (PF) 250 MCG/5ML IJ SOLN
INTRAMUSCULAR | Status: DC | PRN
Start: 2021-09-05 — End: 2021-09-05
  Administered 2021-09-05: 100 ug via INTRAVENOUS

## 2021-09-05 MED ORDER — POVIDONE-IODINE 10 % EX SWAB: 2.0000 "application " | Freq: Once | CUTANEOUS | Status: DC

## 2021-09-05 MED ORDER — 0.9 % SODIUM CHLORIDE (POUR BTL) OPTIME
TOPICAL | Status: DC | PRN
  Administered 2021-09-05: 100 mL

## 2021-09-05 MED ORDER — ATORVASTATIN CALCIUM 40 MG PO TABS
40.0000 mg | ORAL_TABLET | Freq: Every day | ORAL | Status: DC
  Administered 2021-09-06 – 2021-09-07 (×2): 40 mg via ORAL
  Filled 2021-09-05 (×3): qty 1

## 2021-09-05 MED ORDER — PROMETHAZINE HCL 25 MG/ML IJ SOLN: 6.2500 mg | INTRAMUSCULAR | Status: DC | PRN

## 2021-09-05 SURGICAL SUPPLY — 19 items
BNDG ELASTIC 4X5.8 VLCR STR LF (GAUZE/BANDAGES/DRESSINGS) ×1 IMPLANT
BNDG GAUZE ELAST 4 BULKY (GAUZE/BANDAGES/DRESSINGS) ×2 IMPLANT
COVER SURGICAL LIGHT HANDLE (MISCELLANEOUS) ×3 IMPLANT
DRAPE U-SHAPE 47X51 STRL (DRAPES) ×3 IMPLANT
DRSG ADAPTIC 3X8 NADH LF (GAUZE/BANDAGES/DRESSINGS) ×2 IMPLANT
ELECT REM PT RETURN 9FT ADLT (ELECTROSURGICAL) ×2
ELECTRODE REM PT RTRN 9FT ADLT (ELECTROSURGICAL) ×1 IMPLANT
GAUZE SPONGE 4X4 12PLY STRL (GAUZE/BANDAGES/DRESSINGS) ×2 IMPLANT
GLOVE SURG ORTHO LTX SZ9 (GLOVE) ×2 IMPLANT
GLOVE SURG UNDER POLY LF SZ9 (GLOVE) ×2 IMPLANT
GOWN STRL REUS W/ TWL XL LVL3 (GOWN DISPOSABLE) ×2 IMPLANT
GOWN STRL REUS W/TWL XL LVL3 (GOWN DISPOSABLE) ×1
KIT BASIN OR (CUSTOM PROCEDURE TRAY) ×2 IMPLANT
KIT TURNOVER KIT B (KITS) ×2 IMPLANT
NS IRRIG 1000ML POUR BTL (IV SOLUTION) ×2 IMPLANT
PACK ORTHO EXTREMITY (CUSTOM PROCEDURE TRAY) ×2 IMPLANT
STOCKINETTE IMPERVIOUS LG (DRAPES) IMPLANT
SUT ETHILON 3 0 FSL (SUTURE) ×1 IMPLANT
TOWEL GREEN STERILE (TOWEL DISPOSABLE) ×2 IMPLANT

## 2021-09-05 NOTE — Op Note (Signed)
09/04/2021 - 09/05/2021  12:37 PM  PATIENT:  Brian Fuller    PRE-OPERATIVE DIAGNOSIS:  Left Second Toe nonhealing infected ulcer  POST-OPERATIVE DIAGNOSIS:  Same  PROCEDURE: Partial amputation left Second Toe  SURGEON:  Kimberly Coye A Yukari Flax, MD  PHYSICIAN ASSISTANT: none  ANESTHESIA:   General  PREOPERATIVE INDICATIONS:  Brian Fuller is a  71 y.o. male with a diagnosis of Left Second Toe infected nonhealing ulcer who failed conservative measures and elected for surgical management.    The risks benefits and alternatives were discussed with the patient preoperatively including but not limited to the risks of infection, bleeding, nerve injury, cardiopulmonary complications, the need for revision surgery, among others, and the patient was willing to proceed.  ESTIMATED BLOOD LOSS: 15cc   OPERATIVE IMPLANTS: None   OPERATIVE FINDINGS: Infection involving the distal aspect of the left second toe.  Purulence noted underneath the ulcer.  Amputation resected to healthy, bleeding, noninfected margins.   OPERATIVE PROCEDURE:  The patient was brought to the operating room.  Anesthesia was induced.  Patient was given his ceftriaxone dose, as well as is on scheduled Flagyl that he received earlier this morning.  Nonsterile tourniquet was put on the left thigh but not inflated.  The left foot was prepped and draped in a sterile fashion.  Timeout was called.   Local anesthetic was injected 9 cc of 0.25 percent Marcaine plain as a digital block around the metatarsal phalangeal joint.  Using a 15 blade the skin was sharply incised circumferentially proximal to the area of infection down to bone.  The remaining phalanx was then exposed circumferentially and using a bone cutter cut just proximal to the condyles to allow for skin closure.  The toe was sent to pathology and for culture.   The remaining tissue was debrided.  No additional infection was appreciated.  The wound was  thoroughly irrigated with normal saline.  The skin was closed with 3-0 nylon sutures in an interrupted fashion.  A bulky sterile dressing was applied as well as a postop shoe.       Debridement type: Excisional Debridement   Side: Left   Body Location: Second toe   Tools used for debridement: scalpel, freer elevator, and bone cutter   Pre-debridement Wound size (cm):   Length: 2        Width: 2     Depth: 2    Post-debridement Wound size (cm):   Length: 2        Width: 1    Depth: 1   Debridement depth beyond dead/damaged tissue down to healthy viable tissue: yes   Tissue layer involved: skin, subcutaneous tissue, muscle, bone   Nature of tissue removed: Devitalized Tissue   Irrigation volume: 500 mL      Irrigation fluid type: Normal Saline      Post op recs: WB: WBAT LLE in post op shoe Abx: continue abx for at least 23 hours post op, follow up intraop cxs Imaging: none Dressing: keep intact until follow up, change PRN if soiled or saturated. DVT prophylaxis: continue DVT prophylaxis with heparin or equivalent while in the hospital. Follow up: 2 weeks after surgery for a wound check with Dr. Zachery Dakins at [facility].  Address: [address], [place], [place] [address]  Office Phone: [phone]     Charlies Constable, MD Orthopaedic Surgery

## 2021-09-05 NOTE — Progress Notes (Signed)
[facility] Progress Note   Subjective:  Seen in room. Video interpreter used. No complaints this am. No cp, dyspnea. Going for toe amputation today.   Objective Vitals:   09/04/21 2105 09/05/21 0517 09/05/21 0545 09/05/21 1115  BP: (!) 194/86 (!) 156/71 (!) 163/83 (!) 191/67  Pulse: 79 82 81 83  Resp: 18  17 16   Temp: 97.8 F (36.6 C)  98.7 F (37.1 C) 98.3 F (36.8 C)  TempSrc: Oral   Oral  SpO2: 99% 98% 97% 96%  Weight:    64.4 kg  Height:    5\' 3"  (1.6 m)      Additional Objective Labs: Basic Metabolic Panel: Recent Labs  Lab 09/04/21 1320 09/05/21 0231 09/05/21 0303  NA 139 139  --   K 3.8 4.0  --   CL 95* 98  --   CO2 32 30  --   GLUCOSE 180* 175*  --   BUN 19 30*  --   CREATININE 4.00* 5.38*  --   CALCIUM 8.4* 8.6*  --   PHOS  --   --  6.0*   CBC: Recent Labs  Lab 09/04/21 1320 09/05/21 0231  WBC 6.1 7.0  NEUTROABS 4.4  --   HGB 10.6* 10.0*  HCT 31.2* 29.6*  MCV 94.5 94.6  PLT 253 232   Blood Culture    Component Value Date/Time   SDES BLOOD RIGHT HAND 09/04/2021 1320   SPECREQUEST  09/04/2021 1320    BOTTLES DRAWN AEROBIC AND ANAEROBIC Blood Culture adequate volume   CULT  09/04/2021 1320    NO GROWTH < 24 HOURS Performed at [facility], [place] [address], [place], [place] [address]    REPTSTATUS PENDING 09/04/2021 1320     Physical Exam General: Well appearing, nad  Heart: RRR No m,r,g  Lungs: Clear bilaterally Abdomen: soft non-tender  Extremities: No LE edema; L foot bandaged  Dialysis Access: LUE AVF, aneurysmal +bruit   Medications:  ceFAZolin      ceFAZolin (ANCEF) IV     [MAR Hold] ceFEPime (MAXIPIME) IV Stopped (09/04/21 1839)   lactated ringers 10 mL/hr at 09/05/21 1125    [MAR Hold] amLODipine  5 mg Oral Daily   atorvastatin  40 mg Oral Daily   chlorhexidine  60 mL Topical Once   [MAR Hold] heparin  5,000 Units Subcutaneous Q8H   [MAR Hold] hydrALAZINE  25 mg Oral Q8H   povidone-iodine  2  application Topical Once    Dialysis Orders:  AF TTS 3:45 400/500 EDW 63.5 kg 2K/2Ca UFP 2  AVF No heparin  Mircera 75 q 4 wks (start 1/19) Venofer 50 q wk  Hectorol 2 TIW    Assessment/Plan: # ESRD:  -next HD planned for Thursday   # Left second toe osteomyleitis -ortho on board, tentatively planning for partial toe amputation today  -abx per primary service, currently on vanc/cefepime   # Volume/ hypertension: EDW 64kg. Has been dialyzing to under EDW, will need new  EDW with next treatment. Resume home-anti HTNs, will UF as tolerated   # Anemia of Chronic Kidney Disease: Hemoglobin 10.  [name] -- will resume ESA with HD tomorrow.  Avoid IV iron for now (of note, just completed iron load as an outpatient)   # Secondary Hyperparathyroidism/Hyperphosphatemia: Phos not at goal resume home binders   #DM2 w/ hyperglycemia -per primary service   # Vascular access: LUE AVF +b/t   # Additional recommendations: - Dose all meds for  creatinine clearance < 10 ml/min  - Unless absolutely necessary, no MRIs with gadolinium.  - Implement save arm precautions.  Prefer needle sticks in the dorsum of the hands or wrists.  No blood pressure measurements in arm. - If blood transfusion is requested during hemodialysis sessions, please alert [place] prior to the session.  - If a hemodialysis catheter line culture is requested, please alert [place] as only hemodialysis nurses are able to collect those specimens.     Lynnda Child PA-C [facility] 09/05/2021,11:36 AM

## 2021-09-05 NOTE — Anesthesia Procedure Notes (Signed)
Procedure Name: LMA Insertion Date/Time: 09/05/2021 12:11 PM Performed by: Renato Shin, CRNA Pre-anesthesia Checklist: Patient identified, Emergency Drugs available, Suction available and Patient being monitored Patient Re-evaluated:Patient Re-evaluated prior to induction Oxygen Delivery Method: Circle system utilized Preoxygenation: Pre-oxygenation with 100% oxygen Induction Type: IV induction Ventilation: Mask ventilation without difficulty LMA: LMA inserted LMA Size: 4.0 Number of attempts: 1 Placement Confirmation: positive ETCO2 and breath sounds checked- equal and bilateral Tube secured with: Tape Dental Injury: Teeth and Oropharynx as per pre-operative assessment

## 2021-09-05 NOTE — Anesthesia Preprocedure Evaluation (Signed)
Anesthesia Evaluation  Patient identified by MRN, date of birth, ID band Patient awake    Reviewed: Allergy & Precautions, H&P , NPO status , Patient's Chart, lab work & pertinent test results  Airway Mallampati: III  TM Distance: >3 FB Neck ROM: Full    Dental no notable dental hx. (+) Teeth Intact, Dental Advisory Given   Pulmonary neg pulmonary ROS, former smoker,    Pulmonary exam normal breath sounds clear to auscultation       Cardiovascular hypertension, On Medications negative cardio ROS   Rhythm:Regular Rate:Normal     Neuro/Psych negative neurological ROS  negative psych ROS   GI/Hepatic negative GI ROS, Neg liver ROS,   Endo/Other  negative endocrine ROSdiabetes  Renal/GU ESRF and DialysisRenal disease  negative genitourinary   Musculoskeletal   Abdominal   Peds  Hematology negative hematology ROS (+) anemia ,   Anesthesia Other Findings   Reproductive/Obstetrics negative OB ROS                             Anesthesia Physical  Anesthesia Plan  ASA: 4  Anesthesia Plan: General   Post-op Pain Management:    Induction: Intravenous  PONV Risk Score and Plan: 2 and Ondansetron, Midazolam and Treatment may vary due to age or medical condition  Airway Management Planned: LMA  Additional Equipment:   Intra-op Plan:   Post-operative Plan: Extubation in OR  Informed Consent: I have reviewed the patients History and Physical, chart, labs and discussed the procedure including the risks, benefits and alternatives for the proposed anesthesia with the patient or authorized representative who has indicated his/her understanding and acceptance.     Dental advisory given  Plan Discussed with: CRNA  Anesthesia Plan Comments:         Anesthesia Quick Evaluation

## 2021-09-05 NOTE — Discharge Instructions (Addendum)
Dear Brian Fuller,   Thank you for letting us participate in your care!  You were admitted because you had non healing left toe wound. And diagnosed with diabetic toe ulcer.  You were treated with antibiotics and partial amputation of left second toe.   You were also seen by Orthopedic doctor. They performed amputation of the left second toe and recommended you follow up in two weeks (see their info below) .   POST-HOSPITAL & CARE INSTRUCTIONS Follow up with orthopedic doctors in 2 weeks  Please let PCP/Specialists know of any changes in medications that were made.  Please see medications section of this packet for any medication changes.   Thank you for choosing [facility]! Take care and be well!  [facility]  [place], [place] [address] [phone]   Diet: As you were doing prior to hospitalization   Shower:  May shower but keep the wounds dry, use an occlusive plastic wrap, NO SOAKING IN TUB.  If the bandage gets wet, change with a clean dry gauze.   Dressing:  You may change your dressing 3-5 days after surgery, unless you have a splint.  If the dressing remains clean and dry it can also be left on until follow up. If you change the dressing replace with clean gauze and tape or ace wrap.   If you had hand or foot surgery, we will plan to remove your stitches in about 2 weeks in the office.  For all other surgeries, there are sticky tapes (steri-strips) on your wounds and all the stitches are absorbable.  Leave the steri-strips in place when changing your dressings, they will peel off with time, usually 2-3 weeks.  Activity:  Increase activity slowly as tolerated, but follow the weight bearing instructions below.  The rules on driving is that you can not be taking narcotics while you drive, and you must feel in control of the vehicle.    Weight Bearing:   weight bearing  as tolerated left foot with the hard sole shoe..    To prevent constipation: you may use a stool softener such as -  Colace (over the counter) 100 mg by mouth twice a day  Drink plenty of fluids (prune juice may be helpful) and high fiber foods Miralax (over the counter) for constipation as needed.    Itching:  If you experience itching with your medications, try taking only a single pain pill, or even half a pain pill at a time.  You may take up to 10 pain pills per day, and you can also use benadryl over the counter for itching or also to help with sleep.   Precautions:  If you experience chest pain or shortness of breath - call 911 immediately for transfer to the hospital emergency department!!   Call office [phone]) for the following: Temperature greater than 101F Persistent nausea and vomiting Severe uncontrolled pain Redness, tenderness, or signs of infection (pain, swelling, redness, odor or green/yellow discharge around the site) Difficulty breathing, headache or visual disturbances Hives Persistent dizziness or light-headedness Extreme fatigue Any other questions or concerns you may have after discharge  In an emergency, call 911 or go to an Emergency Department at a nearby hospital  Follow- Up Appointment:  Please call for an appointment to be seen approximately 2-3 week after surgery in [facility] with your surgeon Dr. Charlies Constable - [phone] Address: [address]  [place], [place], [place] [address]

## 2021-09-05 NOTE — Anesthesia Postprocedure Evaluation (Signed)
Anesthesia Post Note  Patient: Brian Fuller  Procedure(s) Performed: AMPUTATION Left Second Toe (Left: Toe)     Patient location during evaluation: PACU Anesthesia Type: General Level of consciousness: awake and alert Pain management: pain level controlled Vital Signs Assessment: post-procedure vital signs reviewed and stable Respiratory status: spontaneous breathing, nonlabored ventilation and respiratory function stable Cardiovascular status: blood pressure returned to baseline and stable Postop Assessment: no apparent nausea or vomiting Anesthetic complications: no   No notable events documented.  Last Vitals:  Vitals:   09/05/21 1254 09/05/21 1309  BP: (!) 153/69 138/68  Pulse: 78 79  Resp: 13 13  Temp:  (!) 36.3 C  SpO2: 100% 95%    Last Pain:  Vitals:   09/05/21 1309  TempSrc:   PainSc: 0-No pain                 Lynda Rainwater

## 2021-09-05 NOTE — H&P (View-Only) (Signed)
ORTHOPAEDIC CONSULTATION  REQUESTING PHYSICIAN: Brian Fuller, Brian Ohara, MD  Chief Complaint: Left second toe ulcer  HPI: Brian Fuller is a 71 y.o. male who has a history of ESRD on dialysis who presents to the emergency room with about 2 weeks of progressively worsening ulcer and swelling on the distal tip of the left second toe.  He denies history of prior wounds.  He attributes the wound to cutting his toenails.  He notes some drainage at home but is currently dry with a distal scab.  He denies any fevers or chills.  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    Pneumonia    Systolic murmur    Past Surgical History:  Procedure Laterality Date   AV FISTULA PLACEMENT Left 11/02/2014   Procedure: ARTERIOVENOUS (AV) FISTULA CREATION;  Surgeon: Brian Misty, MD;  Location: [facility] OR;  Service: Vascular;  Laterality: Left;   AV FISTULA PLACEMENT Left 04/26/2015   Procedure: LEFT BRACHIOCEPHALIC ARTERIOVENOUS (AV) FISTULA CREATION;  Surgeon: Brian [place], MD;  Location: [place] OR;  Service: Vascular;  Laterality: Left;   EYE SURGERY     INSERTION OF DIALYSIS CATHETER Right 11/04/2014   Procedure: INSERTION OF DIALYSIS CATHETER RIGHT INTERNAL JUGULAR VEIN;  Surgeon: Brian Misty, MD;  Location: [place];  Service: Vascular;  Laterality: Right;   LIGATION OF ARTERIOVENOUS  FISTULA Left 04/26/2015   Procedure: LIGATION OF LEFT RADIOCEPHALIC ARTERIOVENOUS  FISTULA;  Surgeon: Brian [place] Wantagh, MD;  Location: [place];  Service: Vascular;  Laterality: Left;   REVISON OF ARTERIOVENOUS FISTULA Left 01/18/2015   Procedure: REVISON OF LEFT RADIOCEPHALIC ARTERIOVENOUS FISTULA USING VASCU-GUARD PERIPHERAL VASCULAR PATCH ;  Surgeon: Brian Misty, MD;  Location:  [facility] OR;  Service: Vascular;  Laterality: Left;   TONSILLECTOMY     Social History   Socioeconomic History   Marital status: Married    Spouse name: Not on file   Number of children: Not on file   Years of education: Not on file   Highest education level: Not on file  Occupational History   Not on file  Tobacco Use   Smoking status: Former    Types: Cigarettes    Quit date: 01/17/1984    Years since quitting: 37.6   Smokeless tobacco: Never  Substance and Sexual Activity   Alcohol use: No    Alcohol/week: 0.0 standard drinks   Drug use: No   Sexual activity: Not on file  Other Topics Concern   Not on file  Social History Narrative   Not on file   Social Determinants of Health   Financial Resource Strain: Not on file  Food Insecurity: Not on file  Transportation Needs: Not on file  Physical Activity: Not on file  Stress: Not on file  Social Connections: Not on file   Family History  Problem Relation Age of Onset   Diabetes Mother    Diabetes Father    Diabetes Brother    Hypertension Sister    Allergies  Allergen Reactions   Metformin And Related Other (See Comments)    Metformin contraindication in ESRD   Penicillins Rash     Positive ROS: All  other systems have been reviewed and were otherwise negative with the exception of those mentioned in the HPI and as above.  Physical Exam: General: Alert, no acute distress Cardiovascular: No pedal edema Respiratory: No cyanosis, no use of accessory musculature Skin: No lesions in the area of chief complaint Neurologic: Sensation intact distally Psychiatric: Patient is competent for consent with normal mood and affect  MUSCULOSKELETAL:  LLE approximately 1.5 x 1.5 cm ulcer over the distal tip of the second toe without any active drainage or purulence moderate surrounding erythema and swelling concerning for infection to about the level of the PIP joint  Nontender  No groin pain with log roll  No knee or ankle  effusion  Sens DPN, SPN, TN intact  Motor EHL, ext, flex 5/5  Feet are otherwise warm and well-perfused, No significant edema   IMAGING: Left foot x-rays demonstrate no fracture, dislocation, or other osseous abnormality  Assessment: Principal Problem:   Diabetic toe ulcer (HCC) Active Problems:   Type 2 diabetes mellitus with ophthalmic complication (HCC)   ESRD on dialysis ([place])   Hypertension associated with diabetes ([place])   Hyperlipidemia associated with type 2 diabetes mellitus ([place])   Uninsured   Left second toe nonhealing ulcer  Plan: I had a good discussion with the patient using a video's Spanish interpreter as well as spoke with his family friend, Brian Fuller, over the phone.  I discussed that given the presence of the large toe ulcer in the setting of ESRD and diabetes, he is at high risk of the toe and not healing with conservative treatment and likely spreading proximally.  Discussed that the advantage of partial toe amputation would be to reapproximate clean and healthy tissues, with a lower risk of deep infection, and allow for faster healing.  We will plan for amputation through the PIP or DIP joint depending on the level of healthy tissue available.  The risks benefits and alternatives were discussed with the patient including but not limited to the risks of nonoperative treatment, versus surgical intervention including infection, bleeding, nerve injury, blood clots, cardiopulmonary complications, morbidity, mortality, among others, and they were willing to proceed.      Brian Sheng, MD  Contact information:   [id number] 7am-5pm epic message Dr. Zachery Fuller, or call office for patient follow up: [phone] After hours and holidays please check [email or web address] for group call information for [facility]

## 2021-09-05 NOTE — Progress Notes (Signed)
Family Medicine Teaching Service Daily Progress Note Intern Pager: [phone]  Patient name: Brian Fuller Medical record number: [id number] Date of birth: [birth year] Age: 71 y.o. Gender: male  Primary Care Provider: Pcp, No Consultants: Ortho, nephrology Code Status: Full  Pt Overview and Major Events to Date:  1/17-admitted  Assessment and Plan:  Brian Fuller is a 71 year old male who presented with left second toe wound.  PMH is significant for T2DM, HTN, ESRD on HD, and anemia of chronic disease.  Left second toe wound   osteomyelitis Patient came in with 2 weeks of nonhealing wound in his left second toe.  He continues to be afebrile with negative leukocytosis.  Surgery has been consulted with anticipation to perform a partial toe amputation today.  Patient denies any chills or pain at his wound site. On exam has longer send toes which could be rubbing against his shoe frequently. He might need diabetic footwear outpatient.  -Ortho following, appreciate rec -Continue vancomycin and cefepime -Continue routine vitals -Follow blood culture, urine -surgery scheduled for today -Consider getting a diabetic footwear out patient   T2DM A1c on admission is 7.9.  Blood glucose this morning was 175.  His home medication includes metformin.  Will hold metformin due to patient's ESRD per nephrology. -Continue monitoring CBGs -Hold metformin due to ESRD  ESRD on HD (T, TH, Sat) Creatinine this morning was 5.38.  Neurology has been consulted for patient's HD.  They recommend metformin and avoid an MRI with GI duodenum. -Nephrology following, appreciate recs -HD per nephrology -Avoid nephrotoxic agents, holding metformin -Monitor BMP daily  HTN BP overnight was 151-204/71-86.  BP this morning was 163/83 patient is on home medication of amlodipine 2.5 mg and was started on hydralazine 25 mg  3 times daily.  We will look to discontinue hydralazine with more stable BP. -Continue  amlodipine 5 mg daily -Continue hydralazine 3 times daily -Continue routine vitals  Hyperlipidemia Per chart review patient's last lipid panel 5 years ago showed cholesterol of 191, triglycerides 299, HDL 35 and LDL 96.  Chart review shows patient's takes atorvastatin 40 mg daily.  We will continue his home medication of atorvastatin.  Anemia of chronic disease Hgb on admission was 10.6.  He denies any shortness of breath or dizziness. -Continue daily lab, CBC -Transfusion threshold <8   FEN/GI: N.p.o. PPx: Heparin Dispo:Home pending clinical improvement . Barriers include clinical status.   Subjective:  Brian Fuller awake and laying in bed.  He said he is doing well and denies any pains or chills.  No complaint at this time  Objective: Temp:  [97.8 F (36.6 C)-98.9 F (37.2 C)] 98.7 F (37.1 C) (01/18 0545) Pulse Rate:  [73-82] 81 (01/18 0545) Resp:  [16-18] 17 (01/18 0545) BP: (151-204)/(71-86) 163/83 (01/18 0545) SpO2:  [97 %-100 %] 97 % (01/18 0545) Physical Exam: General:Awake, well appearing, NAD HEENT: Atraumatic, MMM, No sclera icterus CV: RRR, no murmurs, normal S1/S2 Pulm: CTAB, good WOB on RA, no crackles or wheezing Abd: Soft, no distension, no tenderness Skin: dry, warm, nonhealing wound on left second toe, with surrounding edema and skin peeling. Ext: No BLE edema,    Laboratory: Recent Labs  Lab 09/04/21 1320 09/05/21 0231  WBC 6.1 7.0  HGB 10.6* 10.0*  HCT 31.2* 29.6*  PLT 253 232   Recent Labs  Lab 09/04/21 1320 09/05/21 0231  NA 139 139  K 3.8 4.0  CL 95* 98  CO2 32 30  BUN 19 30*  CREATININE 4.00* 5.38*  CALCIUM 8.4* 8.6*  PROT 7.1  --   BILITOT 0.6  --   ALKPHOS 113  --   ALT 38  --   AST 35  --   GLUCOSE 180* 175*     Imaging/Diagnostic Tests: No new test  Alen Bleacher, MD 09/05/2021, 7:35 AM PGY-1, [place] Intern pager: [phone], text pages welcome

## 2021-09-05 NOTE — Hospital Course (Addendum)
Brian Fuller is a 71 y.o. male who was admitted to the [facility] Teaching Service at [facility] for infected left second toe wound. Hospital course is outlined below by system.    Left second toe wound   diabetic toe ulcer Patient presented with nonhealing, infected left second toe wound.  Of foot x-ray shows no evidence of osteomyelitis.  He was afebrile on admission and no signs of leukocytosis.  He was started on antibiotics of cefepime and vancomycin.  Orthopedic surgery was consulted who performed a partial toe amputation without complication. Patient remained afebrile during hospitalization and pain was well controlled with PT/OT evaluated patient and recommended no need for physical therapy.  Wound was dressed by ortho with plan to follow up in two weeks.   ESRD on HD (T, TH, Sat) Patient with history of ESRD on admission had creatinine of 4.0 and GFR of 11.  Nephrology was consulted to manage patient's hemodialysis and recommended avoiding nephrotoxic agents which includes holding his home medication of metformin. Patient completed one course of hemodialysis during hospitalization and monitored closely with daily labs.   Type 2 diabetes A1c on admission was 7.9.  He is on home medication of metformin. Per nephrology recommendation, metformin was discontinued due to patient being on ESRD.  He was discharged on alogliptin 6.25 mg daily.  His CBG was monitored daily. Recommend following up with PCP for long-term management of diabetes.   Hypertension Patient was hypertensive on admission with systolic ranging from 024O - 200s.  His home medication include amlodipine 2.5 mg daily.  His amlodipine was increased to 5 mg daily however were supplemented with hydralazine 25 mg 3 times daily initially due to significantly elevated blood pressure.  He was eventually transitioned to only amlodipine 10 mg daily.  Recommend following up with PCP outpatient for BP control.  Hyperlipidemia Lipid  panel on admission was L[place] 129, triglyceride 104, HLD 31 and LDL 71.  Patient is on home medication of atorvastatin 40 mg daily but reports noncompliance because he ran out of medications.  He was restarted on his statins during admission.   PCP follow-up recommendations Blood pressure on admission was significantly elavated, consider tighter BP control including addition of ACE-I/ARB. Discontinued Metformin due to ESRD and discharged on alogliptin tied 6.25 mg daily. Post surgery follow up with ortho Consider diabetic footwear. Second toes are longer than rest of the toe which could predispose patient to repeated injury on second toe.  Social work was consulted for [facility] in finding patient a new PCP for long-term manage.

## 2021-09-05 NOTE — Consult Note (Signed)
ORTHOPAEDIC CONSULTATION  REQUESTING PHYSICIAN: McDiarmid, Blane Ohara, MD  Chief Complaint: Left second toe ulcer  HPI: Brian Fuller is a 71 y.o. male who has a history of ESRD on dialysis who presents to the emergency room with about 2 weeks of progressively worsening ulcer and swelling on the distal tip of the left second toe.  He denies history of prior wounds.  He attributes the wound to cutting his toenails.  He notes some drainage at home but is currently dry with a distal scab.  He denies any fevers or chills.  Past Medical History:  Diagnosis Date   Anemia    Chronic kidney disease    dialysis, T/Th/Sat   Diabetes mellitus without complication ([place])    Diabetic retinopathy ([place])    ESRD (end stage renal disease) ([place])    Glaucoma    Hypertension    Hyperthyroidism    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    Pneumonia    Systolic murmur    Past Surgical History:  Procedure Laterality Date   AV FISTULA PLACEMENT Left 11/02/2014   Procedure: ARTERIOVENOUS (AV) FISTULA CREATION;  Surgeon: Mal Misty, MD;  Location: [facility] OR;  Service: Vascular;  Laterality: Left;   AV FISTULA PLACEMENT Left 04/26/2015   Procedure: LEFT BRACHIOCEPHALIC ARTERIOVENOUS (AV) FISTULA CREATION;  Surgeon: Conrad Epes, MD;  Location: [place] OR;  Service: Vascular;  Laterality: Left;   EYE SURGERY     INSERTION OF DIALYSIS CATHETER Right 11/04/2014   Procedure: INSERTION OF DIALYSIS CATHETER RIGHT INTERNAL JUGULAR VEIN;  Surgeon: Mal Misty, MD;  Location: [place];  Service: Vascular;  Laterality: Right;   LIGATION OF ARTERIOVENOUS  FISTULA Left 04/26/2015   Procedure: LIGATION OF LEFT RADIOCEPHALIC ARTERIOVENOUS  FISTULA;  Surgeon: Conrad [place], MD;  Location: [place];  Service: Vascular;  Laterality: Left;   REVISON OF ARTERIOVENOUS FISTULA Left 01/18/2015   Procedure: REVISON OF LEFT RADIOCEPHALIC ARTERIOVENOUS FISTULA USING VASCU-GUARD PERIPHERAL VASCULAR PATCH ;  Surgeon: Mal Misty, MD;  Location:  [facility] OR;  Service: Vascular;  Laterality: Left;   TONSILLECTOMY     Social History   Socioeconomic History   Marital status: Married    Spouse name: Not on file   Number of children: Not on file   Years of education: Not on file   Highest education level: Not on file  Occupational History   Not on file  Tobacco Use   Smoking status: Former    Types: Cigarettes    Quit date: 01/17/1984    Years since quitting: 37.6   Smokeless tobacco: Never  Substance and Sexual Activity   Alcohol use: No    Alcohol/week: 0.0 standard drinks   Drug use: No   Sexual activity: Not on file  Other Topics Concern   Not on file  Social History Narrative   Not on file   Social Determinants of Health   Financial Resource Strain: Not on file  Food Insecurity: Not on file  Transportation Needs: Not on file  Physical Activity: Not on file  Stress: Not on file  Social Connections: Not on file   Family History  Problem Relation Age of Onset   Diabetes Mother    Diabetes Father    Diabetes Brother    Hypertension Sister    Allergies  Allergen Reactions   Metformin And Related Other (See Comments)    Metformin contraindication in ESRD   Penicillins Rash     Positive ROS: All  other systems have been reviewed and were otherwise negative with the exception of those mentioned in the HPI and as above.  Physical Exam: General: Alert, no acute distress Cardiovascular: No pedal edema Respiratory: No cyanosis, no use of accessory musculature Skin: No lesions in the area of chief complaint Neurologic: Sensation intact distally Psychiatric: Patient is competent for consent with normal mood and affect  MUSCULOSKELETAL:  LLE approximately 1.5 x 1.5 cm ulcer over the distal tip of the second toe without any active drainage or purulence moderate surrounding erythema and swelling concerning for infection to about the level of the PIP joint  Nontender  No groin pain with log roll  No knee or ankle  effusion  Sens DPN, SPN, TN intact  Motor EHL, ext, flex 5/5  Feet are otherwise warm and well-perfused, No significant edema   IMAGING: Left foot x-rays demonstrate no fracture, dislocation, or other osseous abnormality  Assessment: Principal Problem:   Diabetic toe ulcer (HCC) Active Problems:   Type 2 diabetes mellitus with ophthalmic complication (HCC)   ESRD on dialysis ([place])   Hypertension associated with diabetes ([place])   Hyperlipidemia associated with type 2 diabetes mellitus ([place])   Uninsured   Left second toe nonhealing ulcer  Plan: I had a good discussion with the patient using a video's Spanish interpreter as well as spoke with his family friend, [name], over the phone.  I discussed that given the presence of the large toe ulcer in the setting of ESRD and diabetes, he is at high risk of the toe and not healing with conservative treatment and likely spreading proximally.  Discussed that the advantage of partial toe amputation would be to reapproximate clean and healthy tissues, with a lower risk of deep infection, and allow for faster healing.  We will plan for amputation through the PIP or DIP joint depending on the level of healthy tissue available.  The risks benefits and alternatives were discussed with the patient including but not limited to the risks of nonoperative treatment, versus surgical intervention including infection, bleeding, nerve injury, blood clots, cardiopulmonary complications, morbidity, mortality, among others, and they were willing to proceed.      Willaim Sheng, MD  Contact information:   [id number] 7am-5pm epic message Dr. Zachery Dakins, or call office for patient follow up: [phone] After hours and holidays please check [email or web address] for group call information for [facility]

## 2021-09-05 NOTE — Progress Notes (Signed)
Orthopedic Tech Progress Note Patient Details:  Long Brimage 26-Oct-1950 [id number] OR RN called requesting I drop off a HARD BOTTOM SHOE to be dropped off to PACU   Ortho Devices Type of Ortho Device: Postop shoe/boot Ortho Device/Splint Location: LLE Ortho Device/Splint Interventions: Ordered, Application, Adjustment   Post Interventions Patient Tolerated: Well Instructions Provided: Care of device  Janit Pagan 09/05/2021, 1:51 PM

## 2021-09-05 NOTE — Progress Notes (Addendum)
Inpatient Diabetes Program Recommendations  AACE/ADA: New Consensus Statement on Inpatient Glycemic Control (2015)  Target Ranges:  Prepandial:   less than 140 mg/dL      Peak postprandial:   less than 180 mg/dL (1-2 hours)      Critically ill patients:  140 - 180 mg/dL   Lab Results  Component Value Date   GLUCAP 147 (H) 09/04/2018   HGBA1C 7.9 (H) 09/05/2021    Review of Glycemic Control  Latest Reference Range & Units 09/05/21 02:31  Glucose 70 - 99 mg/dL 175 (H)  (H): Data is abnormally high  Latest Reference Range & Units 09/05/21 02:31  Hemoglobin A1C 4.8 - 5.6 % 7.9 (H)  (H): Data is abnormally high  Diabetes history: DM2 Outpatient Diabetes medications: none Current orders for Inpatient glycemic control: None  Inpatient diabetes recommendations:  CBGs ac/hs  Received referral for long term uncontrolled glucose, insulin therapy and teaching.  Current A1C is 7.9% (average glucose of 180 mg/dL).  Ordered LWWD booklet.  Will speak with him today.  Addendum@1049 : Went to speak with patient; he is going to OR now.  Will see him tomorrow.    Will continue to follow while inpatient.  Thank you, Reche Dixon, MSN, RN Diabetes Coordinator Inpatient Diabetes Program [phone] (team pager from 8a-5p)

## 2021-09-05 NOTE — Transfer of Care (Signed)
Immediate Anesthesia Transfer of Care Note  Patient: Brian Fuller  Procedure(s) Performed: AMPUTATION Left Second Toe (Left: Toe)  Patient Location: PACU  Anesthesia Type:General  Level of Consciousness: drowsy and patient cooperative  Airway & Oxygen Therapy: Patient Spontanous Breathing and Patient connected to face mask oxygen  Post-op Assessment: Report given to RN and Post -op Vital signs reviewed and stable  Post vital signs: Reviewed and stable  Last Vitals:  Vitals Value Taken Time  BP 139/63 09/05/21 1239  Temp    Pulse 76 09/05/21 1242  Resp 12 09/05/21 1242  SpO2 100 % 09/05/21 1242  Vitals shown include unvalidated device data.  Last Pain:  Vitals:   09/05/21 1115  TempSrc: Oral  PainSc: 0-No pain         Complications: No notable events documented.

## 2021-09-05 NOTE — Progress Notes (Signed)
Assessment was completed on pt this morning via the use of [employer] (language interpretor Terrial Rhodes [phone]). Pt was received in bed and had no c/o at this time. Pt is A&O X 4, calm and cooperative, denies pain. Pt stated that his last BM was "about two weeks ago", and does not express any concern about this. Pt's last void was "yesterday some time, but  I haven't gone today as yet". It was explained to pt that he would not be able to eat until after his procedure.   Pt's consent for procedure and blood was attained at 1042 with the aid of Shanon Brow [phone]) - translator. Pt prepped and sent to procedure

## 2021-09-05 NOTE — Progress Notes (Signed)
Pharmacy Antibiotic Note  Brian Fuller is a 71 y.o. male admitted on 09/04/2021 with  suspected bacteremia and cellulitis .  Pharmacy has been consulted for vancomycin and cefepime dosing dosing.   PMH significant for ESRD on TThSat schedule outpatient, which will be continued inpatient. Patient is now status-post left second toe partial amputation. WBC is 7, patient has remained afebrile.   Plan: Vancomycin 750 mg IV with dialysis  Continue cefepime 1g IV q24h  F/u cultures, clinical course, antibiotic length of therapy  Height: 5\' 3"  (160 cm) Weight: 64.4 kg (142 lb) IBW/kg (Calculated) : 56.9  Temp (24hrs), Avg:98.1 F (36.7 C), Min:97.4 F (36.3 C), Max:98.7 F (37.1 C)  Recent Labs  Lab 09/04/21 1320 09/04/21 1635 09/05/21 0231  WBC 6.1  --  7.0  CREATININE 4.00*  --  5.38*  LATICACIDVEN 1.4 1.1  --     Estimated Creatinine Clearance: 10.4 mL/min (A) (by C-G formula based on SCr of 5.38 mg/dL (H)).    Allergies  Allergen Reactions   Metformin And Related Other (See Comments)    Metformin contraindication in ESRD   Penicillins Rash    Antimicrobials this admission: Cefepime 1/17 >> Vancomycin 1/17 >>  Dose adjustments this admission: N/A  Microbiology results: 1/17 BCx: pending 1/18 MRSA PCR: negative Aerobic/anaerobic culture with gram-stain (digit amputation): pending   Thank you for allowing pharmacy to be a part of this patients care.  Zenaida Deed, PharmD PGY1 Acute Care Pharmacy Resident  Phone: [phone] 09/05/2021  4:12 PM  Please check [email or web address] for unit-specific pharmacy phone numbers.

## 2021-09-05 NOTE — Interval H&P Note (Signed)
The patient has been re-examined, and the chart reviewed, and there have been no interval changes to the documented history and physical.      The risks, benefits, and alternatives have been discussed at length with patient, and the patient is willing to proceed.  Left foot 2nd toe marked. Consent has been signed.

## 2021-09-05 NOTE — Progress Notes (Signed)
Reassessment was completed with help of Marianna Fuss #[id number] with the translation services. Pt has denied any pain at this time and states that he doesn't need anything at this time.  Pt received his scheduled medication at 1527 with the help of [facility] #[id number].

## 2021-09-05 NOTE — Progress Notes (Signed)
FPTS Brief Progress Note  S: Patient had partial amputation of left second toe today with orthopedics.  Patient is resting comfortably.  Went to patient's room but did not disturb.   O: BP 139/60 (BP Location: Right Arm)    Pulse 77    Temp 98.3 F (36.8 C) (Oral)    Resp 19    Ht 5\' 3"  (1.6 m)    Wt 64.4 kg    SpO2 98%    BMI 25.15 kg/m   General: Comfortable appearing 71 year old male in no acute distress  A/P: Osteomyelitis-resolved Patient s/p amputation of left second toe.  Currently doing well.  Pain management per orthopedics.  Continue day team plan with no adjustments at this time. - Orders reviewed. Labs for AM ordered, which was adjusted as needed.   Gifford Shave, MD 09/05/2021, 9:04 PM PGY-3, [place] Night Resident  Please page [phone] with questions.

## 2021-09-05 NOTE — Plan of Care (Signed)

## 2021-09-06 ENCOUNTER — Encounter (HOSPITAL_COMMUNITY): Payer: Self-pay | Admitting: Orthopedic Surgery

## 2021-09-06 DIAGNOSIS — E1159 Type 2 diabetes mellitus with other circulatory complications: Secondary | ICD-10-CM

## 2021-09-06 DIAGNOSIS — E11621 Type 2 diabetes mellitus with foot ulcer: Principal | ICD-10-CM

## 2021-09-06 DIAGNOSIS — L97509 Non-pressure chronic ulcer of other part of unspecified foot with unspecified severity: Secondary | ICD-10-CM

## 2021-09-06 DIAGNOSIS — I152 Hypertension secondary to endocrine disorders: Secondary | ICD-10-CM

## 2021-09-06 DIAGNOSIS — N186 End stage renal disease: Secondary | ICD-10-CM

## 2021-09-06 DIAGNOSIS — Z992 Dependence on renal dialysis: Secondary | ICD-10-CM

## 2021-09-06 LAB — CBC
HCT: 29.7 % — ABNORMAL LOW (ref 39.0–52.0)
Hemoglobin: 10 g/dL — ABNORMAL LOW (ref 13.0–17.0)
MCH: 32.3 pg (ref 26.0–34.0)
MCHC: 33.7 g/dL (ref 30.0–36.0)
MCV: 95.8 fL (ref 80.0–100.0)
Platelets: 218 10*3/uL (ref 150–400)
RBC: 3.1 MIL/uL — ABNORMAL LOW (ref 4.22–5.81)
RDW: 13 % (ref 11.5–15.5)
WBC: 6.7 10*3/uL (ref 4.0–10.5)
nRBC: 0 % (ref 0.0–0.2)

## 2021-09-06 LAB — BASIC METABOLIC PANEL
Anion gap: 13 (ref 5–15)
BUN: 49 mg/dL — ABNORMAL HIGH (ref 8–23)
CO2: 27 mmol/L (ref 22–32)
Calcium: 8.4 mg/dL — ABNORMAL LOW (ref 8.9–10.3)
Chloride: 98 mmol/L (ref 98–111)
Creatinine, Ser: 7.82 mg/dL — ABNORMAL HIGH (ref 0.61–1.24)
GFR, Estimated: 7 mL/min — ABNORMAL LOW (ref >60)
Glucose, Bld: 135 mg/dL — ABNORMAL HIGH (ref 70–99)
Potassium: 4.3 mmol/L (ref 3.5–5.1)
Sodium: 138 mmol/L (ref 135–145)

## 2021-09-06 LAB — TSH: TSH: 0.812 u[IU]/mL (ref 0.350–4.500)

## 2021-09-06 LAB — SURGICAL PATHOLOGY

## 2021-09-06 LAB — HEPATITIS B SURFACE ANTIBODY,QUALITATIVE: Hep B S Ab: REACTIVE — AB

## 2021-09-06 LAB — HEPATITIS B SURFACE ANTIGEN: Hepatitis B Surface Ag: NONREACTIVE

## 2021-09-06 MED ORDER — SODIUM CHLORIDE 0.9 % IV SOLN: 100.0000 mL | INTRAVENOUS | Status: DC | PRN

## 2021-09-06 MED ORDER — PENTAFLUOROPROP-TETRAFLUOROETH EX AERO: 1.0000 "application " | INHALATION_SPRAY | CUTANEOUS | Status: DC | PRN

## 2021-09-06 MED ORDER — LIDOCAINE HCL (PF) 1 % IJ SOLN: 5.0000 mL | INTRAMUSCULAR | Status: DC | PRN

## 2021-09-06 MED ORDER — LIDOCAINE-PRILOCAINE 2.5-2.5 % EX CREA: 1.0000 "application " | TOPICAL_CREAM | CUTANEOUS | Status: DC | PRN

## 2021-09-06 MED ORDER — ALTEPLASE 2 MG IJ SOLR: 2.0000 mg | Freq: Once | INTRAMUSCULAR | Status: DC | PRN

## 2021-09-06 MED ORDER — HEPARIN SODIUM (PORCINE) 1000 UNIT/ML DIALYSIS: 1000.0000 [IU] | INTRAMUSCULAR | Status: DC | PRN

## 2021-09-06 NOTE — Progress Notes (Signed)
OT Cancellation Note  Patient Details Name: Brian Fuller MRN: [id number] DOB: [birth year]   Cancelled Treatment:    Reason Eval/Treat Not Completed: Patient at procedure or test/ unavailable- pt off unit in HD. Will follow and see as able.   Jolaine Artist, OT Acute Rehabilitation Services Pager [phone] Office [phone]   Delight Stare 09/06/2021, 12:38 PM

## 2021-09-06 NOTE — Progress Notes (Signed)
Inpatient Diabetes Program Recommendations  AACE/ADA: New Consensus Statement on Inpatient Glycemic Control (2015)  Target Ranges:  Prepandial:   less than 140 mg/dL      Peak postprandial:   less than 180 mg/dL (1-2 hours)      Critically ill patients:  140 - 180 mg/dL   Lab Results  Component Value Date   GLUCAP 110 (H) 09/05/2021   HGBA1C 7.9 (H) 09/05/2021    Review of Glycemic Control  Latest Reference Range & Units 09/05/21 11:11 09/05/21 12:40  Glucose-Capillary 70 - 99 mg/dL 87 110 (H)   Diabetes history: DM 2 Outpatient Diabetes medications:  None listed Current orders for Inpatient glycemic control: None Inpatient Diabetes Program Recommendations:    Referral received regarding possible insulin teaching.  Of note, patient's blood sugars have been okay mostly in the hospital.  Note that he is on HD.  Recommend possibly a DPP-4 such as Tradjenta 5 mg daily (not renally cleared).  Sent message to Dr. Adah Salvage regarding recommendation.  Will follow.   Thanks,  Adah Perl, RN, BC-ADM Inpatient Diabetes Coordinator Pager [phone]  (8a-5p)

## 2021-09-06 NOTE — Progress Notes (Signed)
Pt receives out-pt HD at [facility] SW on TTS. Pt arrives at 5:30 for 5:45 chair time. Will assist as needed.   Melven Sartorius Renal Navigator [phone]

## 2021-09-06 NOTE — Evaluation (Signed)
Physical Therapy Evaluation Patient Details Name: Brian Fuller MRN: [id number] DOB: 29-Oct-1950 Today's Date: 09/06/2021  History of Present Illness  Pt is a 71 y/o male admitted secondary to L second toe wound. Pt is s/p L 2nd toe amputation on 1/18. PMH includes ESRD on HD TTS, HTN, and DM.  Clinical Impression  Pt admitted secondary to problem above with deficits below. Pt requiring min guard A for mobility tasks using RW. One LOB and required min A for safety. Able to maintain heel weightbearing appropriately. Anticipate pt will progress well and will not require follow up PT. Recommending use of RW to increase safety. Will continue to follow acutely.      Recommendations for follow up therapy are one component of a multi-disciplinary discharge planning process, led by the attending physician.  Recommendations may be updated based on patient status, additional functional criteria and insurance authorization.  Follow Up Recommendations No PT follow up    Assistance Recommended at Discharge Intermittent Supervision/Assistance  Patient can return home with the following  A little help with walking and/or transfers    Equipment Recommendations Rolling walker (2 wheels);BSC/3in1  Recommendations for Other Services       Functional Status Assessment Patient has had a recent decline in their functional status and demonstrates the ability to make significant improvements in function in a reasonable and predictable amount of time.     Precautions / Restrictions Precautions Precautions: Fall Restrictions Weight Bearing Restrictions: Yes LLE Weight Bearing: Weight bearing as tolerated (through heel) Other Position/Activity Restrictions: WBAT on heel      Mobility  Bed Mobility Overal bed mobility: Modified Independent                  Transfers Overall transfer level: Needs assistance Equipment used: Rolling walker (2 wheels) Transfers: Sit to/from Stand Sit to  Stand: Min guard           General transfer comment: Min guard for safety. Cues for heel weightbearing and pt able to maintain.    Ambulation/Gait Ambulation/Gait assistance: Min guard, Min assist Gait Distance (Feet): 100 Feet Assistive device: Rolling walker (2 wheels) Gait Pattern/deviations: Step-to pattern, Decreased step length - right, Decreased step length - left, Decreased weight shift to left Gait velocity: Decreased     General Gait Details: Pt able to maintain heel weightbearing appropriately. LOB X1 and required min A. Otherwise requiring min guard. Educated about using RW at home.  Stairs            Wheelchair Mobility    Modified Rankin (Stroke Patients Only)       Balance Overall balance assessment: Mild deficits observed, not formally tested                                           Pertinent Vitals/Pain Pain Assessment Pain Assessment: No/denies pain    Home Living Family/patient expects to be discharged to:: Private residence Living Arrangements: Spouse/significant other Available Help at Discharge: Family;Available 24 hours/day Type of Home: Mobile home Home Access: Stairs to enter Entrance Stairs-Rails: Right;Left;Can reach both Entrance Stairs-Number of Steps: 3   Home Layout: One level Home Equipment: Cane - single point;Shower seat      Prior Function Prior Level of Function : Independent/Modified Independent                     Hand Dominance  Extremity/Trunk Assessment   Upper Extremity Assessment Upper Extremity Assessment: Defer to OT evaluation    Lower Extremity Assessment Lower Extremity Assessment: LLE deficits/detail LLE Deficits / Details: Deficits consistent with post op pain and weakness.    Cervical / Trunk Assessment Cervical / Trunk Assessment: Normal  Communication   Communication: Prefers language other than [place];Interpreter utilized ([employer] interpreter; Hilliard [phone])  Cognition Arousal/Alertness: Awake/alert Behavior During Therapy: WFL for tasks assessed/performed Overall Cognitive Status: Within Functional Limits for tasks assessed                                          General Comments      Exercises     Assessment/Plan    PT Assessment Patient needs continued PT services  PT Problem List Decreased balance;Decreased activity tolerance;Decreased mobility;Decreased knowledge of use of DME;Decreased knowledge of precautions       PT Treatment Interventions Gait training;Stair training;Therapeutic activities;Functional mobility training;DME instruction;Balance training;Therapeutic exercise;Patient/family education    PT Goals (Current goals can be found in the Care Plan section)  Acute Rehab PT Goals Patient Stated Goal: to go home PT Goal Formulation: With patient Time For Goal Achievement: 09/20/21 Potential to Achieve Goals: Good    Frequency Min 3X/week     Co-evaluation               AM-PAC PT "6 Clicks" Mobility  Outcome Measure Help needed turning from your back to your side while in a flat bed without using bedrails?: None Help needed moving from lying on your back to sitting on the side of a flat bed without using bedrails?: None Help needed moving to and from a bed to a chair (including a wheelchair)?: A Little Help needed standing up from a chair using your arms (e.g., wheelchair or bedside chair)?: A Little Help needed to walk in hospital room?: A Little Help needed climbing 3-5 steps with a railing? : A Little 6 Click Score: 20    End of Session Equipment Utilized During Treatment: Gait belt Activity Tolerance: Patient tolerated treatment well Patient left: in bed;with call bell/phone within reach Nurse Communication: Mobility status PT Visit Diagnosis: Other abnormalities of gait and mobility (R26.89);Unsteadiness on feet (R26.81)    Time: 6812-7517 PT Time Calculation (min)  (ACUTE ONLY): 22 min   Charges:   PT Evaluation $PT Eval Low Complexity: 1 Low          Lou Miner, DPT  Acute Rehabilitation Services  Pager: [phone] Office: [phone]   Rudean Hitt 09/06/2021, 4:19 PM

## 2021-09-06 NOTE — Progress Notes (Signed)
° ° ° °  Subjective:  Spoke with the patient using the video Spanish interpreter.  Patient doing well this morning reports minimal pain in the left foot.  Discussed Intra-Op findings.  Discussed plan for weightbearing in the shoe, no pressure on the toes.  Follow-up in 2 weeks for suture removal.  All questions answered.  Objective:   VITALS:   Vitals:   09/05/21 1935 09/05/21 2008 09/06/21 0330 09/06/21 0721  BP: (!) 150/59 139/60 (!) 157/71 (!) 181/74  Pulse: 79 77 73 79  Resp: 16 19 17 17   Temp:  98.3 F (36.8 C)  98 F (36.7 C)  TempSrc:  Oral  Oral  SpO2: 94% 98% 95% 98%  Weight:      Height:        Incision: dressing C/D/I Compartment soft Hard sole shoe on left foot  Lab Results  Component Value Date   WBC 7.0 09/05/2021   HGB 10.0 (L) 09/05/2021   HCT 29.6 (L) 09/05/2021   MCV 94.6 09/05/2021   PLT 232 09/05/2021   BMET    Component Value Date/Time   NA 138 09/06/2021 0302   K 4.3 09/06/2021 0302   CL 98 09/06/2021 0302   CO2 27 09/06/2021 0302   GLUCOSE 135 (H) 09/06/2021 0302   BUN 49 (H) 09/06/2021 0302   CREATININE 7.82 (H) 09/06/2021 0302   CREATININE 6.82 (H) 01/18/2016 1027   CALCIUM 8.4 (L) 09/06/2021 0302   GFRNONAA 7 (L) 09/06/2021 0302   GFRNONAA 8 (L) 01/18/2016 1027     Assessment/Plan: 1 Day Post-Op   Principal Problem:   Diabetic toe ulcer ([place]) Active Problems:   Type 2 diabetes mellitus with ophthalmic complication (HCC)   ESRD on dialysis ([place])   Hypertension associated with diabetes ([place])   Hyperlipidemia associated with type 2 diabetes mellitus (HCC)   Uninsured  Left 2nd toe partial amputation 1/18  Post op recs: WB: WBAT LLE in post op shoe Abx: continue abx for at least 23 hours post op, follow up intraop cxs Imaging: none Dressing: keep intact until follow up, change PRN if soiled or saturated. DVT prophylaxis: continue DVT prophylaxis with heparin or equivalent while in the hospital. Follow up: 2 weeks after surgery  for a wound check with Dr. Zachery Dakins at [facility].  Address: [address] [place], [place], [place] [address]  Office Phone: [phone]    Willaim Sheng 09/06/2021, 7:49 AM   Charlies Constable, MD  Contact information:   [phone] 7am-5pm epic message Dr. Zachery Dakins, or call office for patient follow up: [phone] After hours and holidays please check [email or web address] for group call information for [facility]

## 2021-09-06 NOTE — Progress Notes (Signed)
PT Cancellation Note  Patient Details Name: Brian Fuller MRN: [id number] DOB: [birth year]   Cancelled Treatment:    Reason Eval/Treat Not Completed: Patient at procedure or test/unavailable Pt currently at HD. Will follow up as schedule allows.   Lou Miner, DPT  Acute Rehabilitation Services  Pager: [phone] Office: [phone]    Rudean Hitt 09/06/2021, 12:09 PM

## 2021-09-06 NOTE — Progress Notes (Signed)
[facility] Progress Note   Subjective:  Seen in dialysis unit. Comfortable this am, no complaints. No cp, dyspnea.   Objective Vitals:   09/06/21 0721 09/06/21 0830 09/06/21 0847 09/06/21 0900  BP: (!) 181/74 (!) 167/76 (!) 163/74 130/72  Pulse: 79 79 78 77  Resp: 17 12  12   Temp: 98 F (36.7 C) 98.1 F (36.7 C)    TempSrc: Oral     SpO2: 98% 96% 97% 98%  Weight:  65.5 kg    Height:          Additional Objective Labs: Basic Metabolic Panel: Recent Labs  Lab 09/04/21 1320 09/05/21 0231 09/05/21 0303 09/06/21 0302  NA 139 139  --  138  K 3.8 4.0  --  4.3  CL 95* 98  --  98  CO2 32 30  --  27  GLUCOSE 180* 175*  --  135*  BUN 19 30*  --  49*  CREATININE 4.00* 5.38*  --  7.82*  CALCIUM 8.4* 8.6*  --  8.4*  PHOS  --   --  6.0*  --    CBC: Recent Labs  Lab 09/04/21 1320 09/05/21 0231 09/06/21 0302  WBC 6.1 7.0 6.7  NEUTROABS 4.4  --   --   HGB 10.6* 10.0* 10.0*  HCT 31.2* 29.6* 29.7*  MCV 94.5 94.6 95.8  PLT 253 232 218   Blood Culture    Component Value Date/Time   SDES TISSUE 09/05/2021 1224   SPECREQUEST LEFT GREAT TOE SPEC A 09/05/2021 1224   CULT PENDING 09/05/2021 1224   REPTSTATUS PENDING 09/05/2021 1224     Physical Exam General: Well appearing, nad  Heart: RRR No m,r,g  Lungs: Clear bilaterally  Abdomen: soft non-tender  Extremities: s/p L toe amp; no LE edema; s/p  Dialysis Access: LUE AVF +bruit   Medications:  [START ON 09/07/2021] sodium chloride     [START ON 09/07/2021] sodium chloride     ceFEPime (MAXIPIME) IV 1 g (09/05/21 1814)   vancomycin      amLODipine  5 mg Oral Daily   atorvastatin  40 mg Oral Daily   darbepoetin (ARANESP) injection - DIALYSIS  60 mcg Intravenous Q Thu-HD   heparin  5,000 Units Subcutaneous Q8H   hydrALAZINE  25 mg Oral Q8H    Dialysis Orders:  AF TTS 3:45 400/500 EDW 63.5 kg 2K/2Ca UFP 2  AVF No heparin  Mircera 75 q 4 wks (start 1/19) Venofer 50 q wk  Hectorol 2 TIW       Assessment/Plan: # ESRD: HD TTS  -continue on schedule. HD today. 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 Resume home-anti HTNs, will UF as tolerated   # Anemia of Chronic Kidney Disease: Hemoglobin 10.  [name] -- will resume ESA with HD today  Avoid IV iron for now (of note, just completed iron load as an outpatient)   # Secondary Hyperparathyroidism/Hyperphosphatemia: Phos not at goal resume home binders   #DM2 w/ hyperglycemia -per primary service   # Vascular access: LUE AVF +b/t   # Additional recommendations: - Dose all meds for creatinine clearance < 10 ml/min  - Unless absolutely necessary, no MRIs with gadolinium.  - Implement save arm precautions.  Prefer needle sticks in the dorsum of  the hands or wrists.  No blood pressure measurements in arm. - If blood transfusion is requested during hemodialysis sessions, please alert [place] prior to the session.  - If a hemodialysis catheter line culture is requested, please alert [place] as only hemodialysis [place] PA-C [place] 09/06/2021,9:34 AM

## 2021-09-06 NOTE — Progress Notes (Signed)
Family Medicine Teaching Service Daily Progress Note Intern Pager: [phone]  Patient name: Brian Fuller Medical record number: [id number] Date of birth: May 31, 1951 Age: 71 y.o. Gender: male  Primary Care Provider: Pcp, No Consultants: Ortho, nephrology Code Status: Full  Pt Overview and Major Events to Date:  1/17-admitted 1/18-left second toe amputation  Assessment and Plan:  Brian Fuller is a 71 year old male who presented with left second toe wound.  PMH is significant for T2DM, HTN, ESRD on HD, and anemia of chronic disease.  Left second toe wound  Patient is postop day 1 of left second toe amputation.  Blood culture has no growth in over 24 hours.  He has remained afebrile.  Pain is well controlled and patient is not in any discomfort.  Wound dressing is clean and dry.  Ortho recommend continued antibiotics for 23 hours postop and follow-up in 2 weeks for wound check. -Ortho following, appreciate recs -Continue cefepime and Vancomycin for antibiotics -Tylenol 650 mg QH PRN - PT/OT eval and treat -Continue routine vitals -Wound care per Ortho  Hyperphosphatemia Patient's phosphate this morning was 6.0.  Likely due to his ESRD.  He is scheduled to have hemodialysis today. -Follow-up with BMP post dialysis  T2DM CBG in the last 24 hours have ranged 87-175.  Most recent blood glucose is 135.  Holding home medication of metformin due to ESRD per nephrology. -Continue monitoring CBGs -Continue holding metformin -Consider GLP-1 and Tradjenta  ESRD on HD (T, TH, Sat) Creatinine this morning was 7.82.  Nephrology is following and patient is scheduled to have dialysis today. -Nephrology following, appreciate recs -Hemodialysis today -Avoid nephrotoxic agent, holding metformin -Continue daily BMP labs  HTN BP overnight has ranged from 150-to 181/59-74.  Mild was 181/74.  Home medication include amlodipine 2.5 mg which is recently been increased to 5 mg  daily. -Continue amlodipine 5 mg daily -Continue hydralazine 3 times daily -Continue routine vitals  Hyperlipidemia Patient's lipid panel is improved from previous test with cholesterol 123, triglycerides 104, HDL 31 and LDL 71.  Home medication include atorvastatin 40 mg daily -Continue home medication  Anemia of chronic disease  Hemoglobin this morning was 10.0.  Patient denies any shortness of breath or dizziness. -Continue daily lab, CBC -Transfusion threshold <8   FEN/GI: Renal/carb modified diet PPx: Heparin Dispo:Home pending clinical improvement . Barriers include clinical status.   Subjective:  [name] was awake and laying in bed for hemodialysis.  He is doing well and pain well controlled.  No complaint at this time  Objective: Temp:  [97.4 F (36.3 C)-98.3 F (36.8 C)] 98 F (36.7 C) (01/19 0721) Pulse Rate:  [73-83] 79 (01/19 0721) Resp:  [12-19] 17 (01/19 0721) BP: (138-191)/(59-74) 181/74 (01/19 0721) SpO2:  [94 %-100 %] 98 % (01/19 0721) Weight:  [64.4 kg] 64.4 kg (01/18 1115) Physical Exam: General:Awake, well appearing, NAD HEENT: Atraumatic, MMM, No sclera icterus CV: RRR, no murmurs, normal S1/S2 Pulm: CTAB, good WOB on RA, no crackles or wheezing Abd: Soft, no distension, no tenderness Skin: dry, warm Ext: No BLE edema, wond dressing on left foot is clean and dry.   Laboratory: Recent Labs  Lab 09/04/21 1320 09/05/21 0231  WBC 6.1 7.0  HGB 10.6* 10.0*  HCT 31.2* 29.6*  PLT 253 232   Recent Labs  Lab 09/04/21 1320 09/05/21 0231 09/06/21 0302  NA 139 139 138  K 3.8 4.0 4.3  CL 95* 98 98  CO2 32 30 27  BUN 19 30* 49*  CREATININE 4.00* 5.38* 7.82*  CALCIUM 8.4* 8.6* 8.4*  PROT 7.1  --   --   BILITOT 0.6  --   --   ALKPHOS 113  --   --   ALT 38  --   --   AST 35  --   --   GLUCOSE 180* 175* 135*      Imaging/Diagnostic Tests: No new messages  Alen Bleacher, MD 09/06/2021, 7:24 AM PGY-1, [place] Intern pager: [phone], text pages welcome

## 2021-09-06 NOTE — Progress Notes (Signed)
FPTS Brief Progress Note  S: Patient is sleeping comfortably on his side when I get to the room.  Did not wake the patient.   O: BP (!) 143/64 (BP Location: Right Arm)    Pulse 77    Temp 97.9 F (36.6 C) (Oral)    Resp 15    Ht 5\' 3"  (1.6 m)    Wt 65.1 kg    SpO2 98%    BMI 25.42 kg/m   General: Resting comfortably in no acute distress Respiratory: Normal work of breathing  A/P: Osteomyelitis S/p amputation of left second toe.  Plan for discharge tomorrow after evaluated by physical therapy.  Continue with day team plans with no change at this time. - Orders reviewed. Labs for AM ordered, which was adjusted as needed.   Gifford Shave, MD 09/06/2021, 9:59 PM PGY-3,  Family Medicine Night Resident  Please page [phone] with questions.

## 2021-09-07 ENCOUNTER — Other Ambulatory Visit (HOSPITAL_COMMUNITY): Payer: Self-pay

## 2021-09-07 ENCOUNTER — Encounter (HOSPITAL_COMMUNITY): Payer: Self-pay | Admitting: *Deleted

## 2021-09-07 DIAGNOSIS — I10 Essential (primary) hypertension: Secondary | ICD-10-CM

## 2021-09-07 LAB — HEPATITIS B DNA, ULTRAQUANTITATIVE, PCR
HBV DNA SERPL PCR-ACNC: NOT DETECTED IU/mL
HBV DNA SERPL PCR-LOG IU: UNDETERMINED log10 IU/mL

## 2021-09-07 LAB — HEPATITIS B SURFACE ANTIBODY, QUANTITATIVE
Hep B S AB Quant (Post): 44.4 m[IU]/mL (ref >9.9)
Hep B S AB Quant (Post): 46.8 m[IU]/mL (ref >9.9)

## 2021-09-07 MED ORDER — AMLODIPINE BESYLATE 10 MG PO TABS
10.0000 mg | ORAL_TABLET | Freq: Every day | ORAL | 3 refills | Status: DC
  Filled 2021-09-07: qty 30, 30d supply, fill #0

## 2021-09-07 MED ORDER — CHLORHEXIDINE GLUCONATE CLOTH 2 % EX PADS
6.0000 | MEDICATED_PAD | Freq: Every day | CUTANEOUS | Status: DC
  Administered 2021-09-07: 6 via TOPICAL

## 2021-09-07 MED ORDER — ALOGLIPTIN BENZOATE 6.25 MG PO TABS
1.0000 | ORAL_TABLET | Freq: Every day | ORAL | 0 refills | Status: DC
  Filled 2021-09-07: qty 30, 30d supply, fill #0

## 2021-09-07 NOTE — Progress Notes (Signed)
[facility] Progress Note   Subjective:    Seen and examined at bedside. Laptop interpreter utilized and patient's wife also at bedside. Patient with no complaints, appears comfortable. Noted minimal UF with yesterday's HD. Plan for HD 09/08/21 if patient is still here.  Objective Vitals:   09/06/21 1300 09/06/21 1947 09/07/21 0813 09/07/21 1152  BP: (!) 151/60 (!) 143/64 (!) 172/74 (!) 169/62  Pulse: 79 77 80 82  Resp: 13 15 19 14   Temp: 98.6 F (37 C) 97.9 F (36.6 C) 98 F (36.7 C) 98 F (36.7 C)  TempSrc: Oral Oral  Oral  SpO2: 97% 98% 97% 98%  Weight:      Height:       Physical Exam General: Well-appearing; NAD Heart: Normal S1 and S2; No murmurs, gallops, or rubs Lungs: Clear throughout; No wheezing, rales, or rhonchi Abdomen: Soft and non-tender Extremities: S/p L toe amputation-ace wrap and foot boot in place; No LE edema Dialysis Access: LUE AVF (+) Bruit/Thrill   Filed Weights   09/05/21 1115 09/06/21 0830 09/06/21 1230  Weight: 64.4 kg 65.5 kg 65.1 kg    Intake/Output Summary (Last 24 hours) at 09/07/2021 1313 Last data filed at 09/07/2021 0600 Gross per 24 hour  Intake 250 ml  Output 600 ml  Net -350 ml    Additional Objective Labs: Basic Metabolic Panel: Recent Labs  Lab 09/04/21 1320 09/05/21 0231 09/05/21 0303 09/06/21 0302  NA 139 139  --  138  K 3.8 4.0  --  4.3  CL 95* 98  --  98  CO2 32 30  --  27  GLUCOSE 180* 175*  --  135*  BUN 19 30*  --  49*  CREATININE 4.00* 5.38*  --  7.82*  CALCIUM 8.4* 8.6*  --  8.4*  PHOS  --   --  6.0*  --    Liver Function Tests: Recent Labs  Lab 09/04/21 1320  AST 35  ALT 38  ALKPHOS 113  BILITOT 0.6  PROT 7.1  ALBUMIN 3.1*   No results for input(s): LIPASE, AMYLASE in the last 168 hours. CBC: Recent Labs  Lab 09/04/21 1320 09/05/21 0231 09/06/21 0302  WBC 6.1 7.0 6.7  NEUTROABS 4.4  --   --   HGB 10.6* 10.0* 10.0*  HCT 31.2* 29.6* 29.7*  MCV 94.5 94.6 95.8  PLT 253 232  218   Blood Culture    Component Value Date/Time   SDES TISSUE 09/05/2021 1224   SPECREQUEST LEFT GREAT TOE SPEC A 09/05/2021 1224   CULT  09/05/2021 1224    FEW GROUP B STREP(S.AGALACTIAE)ISOLATED RARE STAPHYLOCOCCUS AUREUS SUSCEPTIBILITIES TO FOLLOW Performed at [facility] [address], [place], [place] [address]    REPTSTATUS PENDING 09/05/2021 1224    Cardiac Enzymes: No results for input(s): CKTOTAL, CKMB, CKMBINDEX, TROPONINI in the last 168 hours. CBG: Recent Labs  Lab 09/05/21 1111 09/05/21 1240  GLUCAP 87 110*   Iron Studies: No results for input(s): IRON, TIBC, TRANSFERRIN, FERRITIN in the last 72 hours. Lab Results  Component Value Date   INR 1.04 11/02/2014   INR 1.13 10/30/2014   INR 1.05 02/21/2014   Studies/Results: No results found.  Medications:  ceFEPime (MAXIPIME) IV 1 g (09/06/21 2023)   vancomycin 750 mg (09/06/21 1055)    amLODipine  5 mg Oral Daily   atorvastatin  40 mg Oral Daily   Chlorhexidine Gluconate Cloth  6 each Topical Daily   darbepoetin (ARANESP) injection -  DIALYSIS  60 mcg Intravenous Q Thu-HD   heparin  5,000 Units Subcutaneous Q8H    Dialysis Orders: AF TTS 3:45 400/500 EDW 63.5 kg 2K/2Ca UFP 2  AVF No heparin  Mircera 75 q 4 wks (start 1/19) Venofer 50 q wk  Hectorol 2 TIW   Assessment/Plan: # ESRD: HD TTS  -continue on schedule. HD 09/08/21. 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 Resume home-anti HTNs, will UF as tolerated   # Anemia of Chronic Kidney Disease: Hemoglobin 10.  [name] -- will resume ESA with HD today  Avoid IV iron for now (of note, just completed iron load as an outpatient)   # Secondary Hyperparathyroidism/Hyperphosphatemia: Phos not at goal resume home binders   #DM2 w/  hyperglycemia -per primary service   # Vascular access: LUE AVF +b/t   # Additional recommendations: - Dose all meds for creatinine clearance < 10 ml/min  - Unless absolutely necessary, no MRIs with gadolinium.  - Implement save arm precautions.  Prefer needle sticks in the dorsum of the hands or wrists.  No blood pressure measurements in arm. - If blood transfusion is requested during hemodialysis sessions, please alert [place] prior to the session.  - If a hemodialysis catheter line culture is requested, please alert [place] as only hemodialysis nurses   Tobie Poet, NP [facility] 09/07/2021,1:13 PM  LOS: 3 days

## 2021-09-07 NOTE — Evaluation (Signed)
Occupational Therapy Evaluation Patient Details Name: Brian Fuller MRN: [id number] DOB: January 19, 1951 Today's Date: 09/07/2021   History of Present Illness Pt is a 71 y/o male admitted secondary to L second toe wound. Pt is s/p L 2nd toe amputation on 1/18. PMH includes ESRD on HD TTS, HTN, and DM.   Clinical Impression   Patient evaluated by Occupational Therapy with no further acute OT needs identified. All education has been completed and the patient has no further questions. Pt is able to complete ADLs with supervision/set up assist.  All education completed.  See below for any follow-up Occupational Therapy or equipment needs. OT is signing off. Thank you for this referral.       Recommendations for follow up therapy are one component of a multi-disciplinary discharge planning process, led by the attending physician.  Recommendations may be updated based on patient status, additional functional criteria and insurance authorization.   Follow Up Recommendations  No OT follow up    Assistance Recommended at Discharge Intermittent Supervision/Assistance  Patient can return home with the following Assist for transportation;A little help with bathing/dressing/bathroom;A little help with walking and/or transfers;Help with stairs or ramp for entrance    Functional Status Assessment  Patient has had a recent decline in their functional status and demonstrates the ability to make significant improvements in function in a reasonable and predictable amount of time.  Equipment Recommendations  BSC/3in1    Recommendations for Other Services       Precautions / Restrictions Precautions Precautions: Fall Restrictions Weight Bearing Restrictions: Yes LLE Weight Bearing: Weight bearing as tolerated Other Position/Activity Restrictions: WBAT on heel      Mobility Bed Mobility Overal bed mobility: Modified Independent                  Transfers Overall transfer level:  Needs assistance Equipment used: Rolling walker (2 wheels) Transfers: Sit to/from Stand, Bed to chair/wheelchair/BSC Sit to Stand: Supervision     Step pivot transfers: Supervision            Balance Overall balance assessment: Mild deficits observed, not formally tested                                         ADL either performed or assessed with clinical judgement   ADL Overall ADL's : Needs assistance/impaired Eating/Feeding: Independent   Grooming: Wash/dry hands;Wash/dry face;Oral care;Brushing hair;Set up;Sitting   Upper Body Bathing: Set up;Supervision/ safety;Sitting   Lower Body Bathing: Supervison/ safety;Sit to/from stand   Upper Body Dressing : Supervision/safety;Set up;Sitting   Lower Body Dressing: Supervision/safety;Sit to/from stand   Toilet Transfer: Supervision/safety;Ambulation;Comfort height toilet;Rolling walker (2 wheels)   Toileting- Clothing Manipulation and Hygiene: Supervision/safety;Sit to/from Nurse, children's Details (indicate cue type and reason): Pt and wife instructed how to use BSC as shower seat when he is cleared by MD to shower Functional mobility during ADLs: Supervision/safety General ADL Comments: Pt and wife instructed that pt should sponge bathe until cleared for shower by MD.  They verbalized understanding.  Instructed them that nurse would provide info on how to clean his wound.     Vision         Perception     Praxis      Pertinent Vitals/Pain Pain Assessment Pain Assessment: No/denies pain     Hand Dominance Right   Extremity/Trunk Assessment Upper Extremity Assessment  Upper Extremity Assessment: Overall WFL for tasks assessed   Lower Extremity Assessment Lower Extremity Assessment: Defer to PT evaluation   Cervical / Trunk Assessment Cervical / Trunk Assessment: Normal   Communication Communication Communication: Prefers language other than [place];Interpreter utilized  (video interpreter Magda Paganini [phone])   Cognition Arousal/Alertness: Awake/alert Behavior During Therapy: WFL for tasks assessed/performed Overall Cognitive Status: Within Functional Limits for tasks assessed                                       General Comments  discussed use of walker bag on RW    Exercises     Shoulder Instructions      Home Living Family/patient expects to be discharged to:: Private residence Living Arrangements: Spouse/significant other Available Help at Discharge: Family;Available 24 hours/day Type of Home: Mobile home Home Access: Stairs to enter Entrance Stairs-Number of Steps: 3 Entrance Stairs-Rails: Right;Left;Can reach both Home Layout: One level     Bathroom Shower/Tub: Occupational psychologist: Standard     Home Equipment: Cane - single point;Shower seat          Prior Functioning/Environment Prior Level of Function : Independent/Modified Independent                        OT Problem List: Decreased knowledge of use of DME or AE      OT Treatment/Interventions:      OT Goals(Current goals can be found in the care plan section) Acute Rehab OT Goals Patient Stated Goal: to go home today OT Goal Formulation: All assessment and education complete, DC therapy  OT Frequency:      Co-evaluation              AM-PAC OT "6 Clicks" Daily Activity     Outcome Measure Help from another person eating meals?: None Help from another person taking care of personal grooming?: A Little Help from another person toileting, which includes using toliet, bedpan, or urinal?: A Little Help from another person bathing (including washing, rinsing, drying)?: A Little Help from another person to put on and taking off regular upper body clothing?: A Little Help from another person to put on and taking off regular lower body clothing?: A Little 6 Click Score: 19   End of Session Equipment Utilized During Treatment:  Rolling walker (2 wheels) Nurse Communication: Mobility status  Activity Tolerance: Patient tolerated treatment well Patient left: in bed;with call bell/phone within reach;with family/visitor present  OT Visit Diagnosis: Unsteadiness on feet (R26.81)                Time: 3276-1470 OT Time Calculation (min): 36 min Charges:  OT General Charges $OT Visit: 1 Visit OT Evaluation $OT Eval Moderate Complexity: 1 Mod OT Treatments $Self Care/Home Management : 8-22 mins  Nilsa Nutting., OTR/L Acute Rehabilitation Services Pager [phone] Office [phone]   Lucille Passy M 09/07/2021, 2:13 PM

## 2021-09-07 NOTE — Discharge Summary (Signed)
[facility] Discharge Summary  Patient name: Brian Fuller Medical record number: [id number] Date of birth: 1951/06/04 Age: 71 y.o. Gender: male Date of Admission: 09/04/2021  Date of Discharge: 09/07/2021 Admitting Physician: Blane Ohara McDiarmid, MD  Primary Care Provider: Pcp, No Consultants: Ortho, PT/OT   Indication for Hospitalization:  Nonhealing left second toe wound  Discharge Diagnoses/Problem List:  Infected nonhealing left second toe wound  Disposition: Home  Discharge Condition: Stable  Discharge Exam:  General:Awake, well appearing, NAD HEENT: Atraumatic, MMM, No sclera icterus CV: RRR, no murmurs, normal S1/S2 Pulm: CTAB, good WOB on RA, no crackles or wheezing Abd: Soft, no distension, no tenderness Skin: dry, warm Ext: No BLE edema, wound dressing on left foot is clean and dry.    Brief Hospital Course:  Brian Fuller is a 71 y.o. male who was admitted to the [facility] Teaching Service at [facility] for infected left second toe wound. Hospital course is outlined below by system.    Left second toe wound   diabetic toe ulcer Patient presented with nonhealing, infected left second toe wound.  Of foot x-ray shows no evidence of osteomyelitis.  He was afebrile on admission and no signs of leukocytosis.  He was started on antibiotics of cefepime and vancomycin.  Orthopedic surgery was consulted who performed a partial toe amputation without complication. Patient remained afebrile during hospitalization and pain was well controlled with PT/OT evaluated patient and recommended no need for physical therapy.  Wound was dressed by ortho with plan to follow up in two weeks.   ESRD on HD (T, TH, Sat) Patient with history of ESRD on admission had creatinine of 4.0 and GFR of 11.  Nephrology was consulted to manage patient's hemodialysis and recommended avoiding nephrotoxic agents which includes holding his home medication of  metformin. Patient completed one course of hemodialysis during hospitalization and monitored closely with daily labs.   Type 2 diabetes A1c on admission was 7.9.  He is on home medication of metformin. Per nephrology recommendation, metformin was discontinued due to patient being on ESRD.  He was discharged on alogliptin 6.25 mg daily.  His CBG was monitored daily. Recommend following up with PCP for long-term management of diabetes.   Hypertension Patient was hypertensive on admission with systolic ranging from 272Z - 200s.  His home medication include amlodipine 2.5 mg daily.  His amlodipine was increased to 5 mg daily however were supplemented with hydralazine 25 mg 3 times daily initially due to significantly elevated blood pressure.  He was eventually transitioned to only amlodipine 10 mg daily.  Recommend following up with PCP outpatient for BP control.  Hyperlipidemia Lipid panel on admission was Lustral 129, triglyceride 104, HLD 31 and LDL 71.  Patient is on home medication of atorvastatin 40 mg daily but reports noncompliance because he ran out of medications.  He was restarted on his statins during admission.   PCP follow-up recommendations Blood pressure on admission was significantly elavated, consider tighter BP control including addition of ACE-I/ARB. Discontinued Metformin due to ESRD and discharged on alogliptin tied 6.25 mg daily. Post surgery follow up with ortho Consider diabetic footwear. Second toes are longer than rest of the toe which could predispose patient to repeated injury on second toe.  Social work was consulted for [facility] in finding patient a new PCP for long-term manage.   Significant Procedures: Left second toe partial amputation  Significant Labs and Imaging:  Recent Labs  Lab 09/04/21 1320 09/05/21 0231 09/06/21 0302  WBC 6.1 7.0 6.7  HGB 10.6* 10.0* 10.0*  HCT 31.2* 29.6* 29.7*  PLT 253 232 218   Recent Labs  Lab 09/04/21 1320 09/05/21 0231  09/05/21 0303 09/06/21 0302  NA 139 139  --  138  K 3.8 4.0  --  4.3  CL 95* 98  --  98  CO2 32 30  --  27  GLUCOSE 180* 175*  --  135*  BUN 19 30*  --  49*  CREATININE 4.00* 5.38*  --  7.82*  CALCIUM 8.4* 8.6*  --  8.4*  PHOS  --   --  6.0*  --   ALKPHOS 113  --   --   --   AST 35  --   --   --   ALT 38  --   --   --   ALBUMIN 3.1*  --   --   --     DG Foot Complete Left  Result Date: 09/04/2021 CLINICAL DATA:  Left foot pain for 15 days. Second toe discolored. Diabetic. EXAM: LEFT FOOT - COMPLETE 3+ VIEW COMPARISON:  None. FINDINGS: Vascular calcifications are noted. Mild joint space narrowing of the interphalangeal joints diffusely. Mild dorsal talonavicular degenerative osteophytosis. No acute fracture is seen. No dislocation. No cortical erosion is seen. No soft tissue ulcer is identified. IMPRESSION: 1. No cortical erosion or acute fracture is seen. 2. Mild osteoarthritis as above. Electronically Signed   By: Yvonne Kendall M.D.   On: 09/04/2021 13:57     Results/Tests Pending at Time of Discharge: None  Discharge Medications:  Allergies as of 09/07/2021       Reactions   Metformin And Related Other (See Comments)   Metformin contraindication in ESRD   Penicillins Rash        Medication List     TAKE these medications    Alogliptin Benzoate 6.25 MG Tabs Tome 1 tableta por va oral diariamente. (Take 1 tablet by mouth daily.)   amLODipine 10 MG tablet Commonly known as: NORVASC Tome 1 tableta (10 mg en total) por va oral diariamente. (Take 1 tablet (10 mg total) by mouth daily.) What changed:  medication strength how much to take how to take this when to take this additional instructions   atorvastatin 40 MG tablet Commonly known as: Lipitor Take 1 tablet (40 mg total) by mouth daily.   Auryxia 1 GM 210 MG(Fe) tablet Generic drug: ferric citrate Take 420 mg by mouth 2 (two) times daily with a meal.   calcium acetate 667 MG capsule Commonly known  as: PHOSLO Take 1 capsule (667 mg total) by mouth 3 (three) times daily with meals.   cinacalcet 60 MG tablet Commonly known as: SENSIPAR Take 60 mg by mouth daily with lunch.               Durable Medical Equipment  (From admission, onward)           Start     Ordered   09/07/21 1115  For home use only DME 3 n 1  Once        09/07/21 1114   09/07/21 1114  For home use only DME Walker rolling  Once       Question Answer Comment  Walker: With 5 Inch Wheels   Patient needs a walker to treat with the following condition Weakness      09/07/21 1114            Discharge Instructions: Please refer  to Patient Instructions section of EMR for full details.  Patient was counseled important signs and symptoms that should prompt return to medical care, changes in medications, dietary instructions, activity restrictions, and follow up appointments.   Follow-Up Appointments:  Follow-up [place] Follow up on 10/17/2021.   Why: You have an appointment scheduled with the [facility] on 10/17/2021 at 2:30 pm  with Dr. Georgiann Hahn to establish primary care. Contact information: [place] [address] [phone]        [facility] Follow up on 09/20/2021.   Why: Post hospital follow up scheduled for  09/20/2021 at 2:30 pm with Dr. Zachery Dakins ( ortho MD) Contact information: [facility] [place] [address] [phone]                 Alen Bleacher, MD 09/07/2021, 3:04 PM PGY-1, [place]

## 2021-09-07 NOTE — TOC Initial Note (Addendum)
Transition of Care [facility]) - Initial/Assessment Note    Patient Details  Name: Brian Fuller MRN: [id number] Date of Birth: June 18, 1951  Transition of Care [facility]) CM/SW Contact:    Sharin Mons, RN Phone Number: 09/07/2021, 11:02 AM  Clinical Narrative:      s/p L 2nd toe amputation on 1/18. PMH includes ESRD on HD TTS, HTN, and DM. Spanish speaking      NCM spoke with pt regarding d/c planning via  [place], [place] [phone]. Pt is from home with wife. States independent with ADL's , no DME usage.  Pt without problems affording Rx meds. States has Medicaid. Has transportation to home and MD office visits.  DME: rolling walker and 3in1/BSC, referral made with [employer] . Equipment will be delivered to bedside prior to d/c.  [facility]  team will continue monitor and assist with needs....  09/07/2021 Pt without PCP. [place] [facility]   [phone] [place] [phone]     Next Steps: Follow up on 10/17/2021 Appointment:  Instructions: You have an appointment scheduled with the [facility] on 10/17/2021 at 2:30 pm with Dr. Georgiann Hahn to establish primary care   Expected Discharge Plan: Home/Self Care     Patient Goals and CMS Choice        Expected Discharge Plan and Services Expected Discharge Plan: Home/Self Care In-house Referral: Financial Counselor (Medicaid screening) Discharge Planning Services: CM Consult                                          Prior Living Arrangements/Services   Lives with:: Spouse Patient language and need for interpreter reviewed:: Yes Do you feel safe going back to the place where you live?: Yes      Need for Family Participation in Patient Care: Yes (Comment) Care giver support system in place?: Yes (comment)   Criminal Activity/Legal Involvement Pertinent to Current  Situation/Hospitalization: No - Comment as needed  Activities of Daily Living      Permission Sought/Granted                  Emotional Assessment Appearance:: Appears stated age Attitude/Demeanor/Rapport: Gracious Affect (typically observed): Accepting Orientation: : Oriented to Self, Oriented to Place, Oriented to  Time, Oriented to Situation Alcohol / Substance Use: Not Applicable Psych Involvement: No (comment)  Admission diagnosis:  Osteomyelitis ([place]) [M86.9] Pain [R52] Patient Active Problem List   Diagnosis Date Noted   Hyperlipidemia associated with type 2 diabetes mellitus ([place]) 09/05/2021   Uninsured 09/05/2021   Diabetic toe ulcer ([place]) 09/04/2021   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   Proliferative diabetic retinopathy of left eye with macular edema associated with type 2 diabetes mellitus ([place]) 03/25/2019   Type 2 diabetes mellitus with chronic kidney disease on chronic dialysis, without long-term current use of insulin ([place]) 01/06/2017   Nuclear sclerotic cataract of left eye 12/12/2016   Hypertension associated with diabetes ([place]) 02/25/2014   Anemia 02/21/2014   Glaucoma 02/21/2014   Type 2 diabetes mellitus with ophthalmic complication ([place]) 03/50/0938   ESRD on dialysis ([place]) 02/21/2014   PCP:  Merryl Hacker, No Pharmacy:   [facility] [place] [address] Phone: [phone] Fax: [place] [address]  [place] [address] Phone: [phone] Fax: [phone]     Social Determinants of Health (SDOH) Interventions    Readmission Risk Interventions No flowsheet data found.

## 2021-09-07 NOTE — Progress Notes (Signed)
D/C order noted. Contacted [facility] and spoke to [place]. Clinic advised pt for d/c today and will resume care tomorrow.   Melven Sartorius Renal Navigator [phone]

## 2021-09-09 LAB — CULTURE, BLOOD (ROUTINE X 2)
Culture: NO GROWTH
Culture: NO GROWTH
Special Requests: ADEQUATE
Special Requests: ADEQUATE

## 2021-09-10 ENCOUNTER — Other Ambulatory Visit (HOSPITAL_COMMUNITY): Payer: Self-pay

## 2021-09-10 LAB — AEROBIC/ANAEROBIC CULTURE W GRAM STAIN (SURGICAL/DEEP WOUND)

## 2021-09-14 ENCOUNTER — Emergency Department (HOSPITAL_COMMUNITY)
Admission: EM | Admit: 2021-09-14 | Discharge: 2021-09-14 | Disposition: A | Discharge status: Home / Self Care | Payer: Self-pay | Attending: Emergency Medicine | Admitting: Emergency Medicine

## 2021-09-14 ENCOUNTER — Encounter (HOSPITAL_COMMUNITY): Payer: Self-pay | Admitting: Emergency Medicine

## 2021-09-14 DIAGNOSIS — Z992 Dependence on renal dialysis: Secondary | ICD-10-CM | POA: Insufficient documentation

## 2021-09-14 DIAGNOSIS — M79672 Pain in left foot: Secondary | ICD-10-CM | POA: Insufficient documentation

## 2021-09-14 DIAGNOSIS — Z7984 Long term (current) use of oral hypoglycemic drugs: Secondary | ICD-10-CM | POA: Insufficient documentation

## 2021-09-14 DIAGNOSIS — N186 End stage renal disease: Secondary | ICD-10-CM | POA: Insufficient documentation

## 2021-09-14 DIAGNOSIS — E1122 Type 2 diabetes mellitus with diabetic chronic kidney disease: Secondary | ICD-10-CM | POA: Insufficient documentation

## 2021-09-14 MED ORDER — OXYCODONE HCL 5 MG PO TABS: 2.5000 mg | ORAL_TABLET | Freq: Four times a day (QID) | ORAL | 0 refills | Status: DC | PRN

## 2021-09-14 NOTE — ED Triage Notes (Signed)
Patient here with complaint of left foot pain, was recently discharged after evaluation of diabetic toe ulcer, was discharged with refill of medications but was not prescribed pain medicine. Patient here requesting pain medication prescription. Patient alert, oriented, afebrile, and in no apparent distress at this time.

## 2021-09-14 NOTE — ED Notes (Signed)
Patient discharge instructions and prescription reviewed with the patient, patient verbalized understanding. Patient discharged.

## 2021-09-14 NOTE — Discharge Instructions (Signed)
Please read and follow all provided instructions.  Your diagnoses today include:  1. Left foot pain    Tests performed today include: Vital signs. See below for your results today.   Medications prescribed:  Oxycodone - narcotic pain medication  DO NOT drive or perform any activities that require you to be awake and alert because this medicine can make you drowsy.   Use pain medication only under direct supervision at the lowest possible dose needed to control your pain.   Take any prescribed medications only as directed.   Home care instructions:  Currently, your wound appears to be healing well.  Please read attached information about wound infection and return if these occur.  Follow any educational materials contained in this packet. Keep affected area above the level of your heart when possible. Wash area gently twice a day with warm soapy water. Do not apply alcohol or hydrogen peroxide. Cover the area if it draining or weeping.   Follow-up instructions: Please follow-up with your surgeon as planned.  Return instructions:  Return to the Emergency Department if you have: Fever Worsening symptoms Worsening pain Worsening swelling Redness of the skin that moves away from the affected area, especially if it streaks away from the affected area  Any other emergent concerns  Your vital signs today were: BP (!) 173/74 (BP Location: Left Arm)    Pulse 82    Temp 98 F (36.7 C) (Oral)    Resp 16    SpO2 99%  If your blood pressure (BP) was elevated above 135/85 this visit, please have this repeated by your doctor within one month. --------------

## 2021-09-14 NOTE — ED Provider Notes (Signed)
[facility] EMERGENCY DEPARTMENT Provider Note   CSN: [id number] Arrival date & time: 09/14/21  0946     History  Chief Complaint  Patient presents with   Foot Pain    Brian Fuller is a 71 y.o. male.  Patient with history of diabetes, ESRD on dialysis --presents to the emergency department today for left toe pain.  Patient had partial toe amputation on 09/05/2021 and was discharged from the hospital for infection on 09/07/2021.  He is no longer on antibiotics.  He was not given anything for pain and states that he did not have much pain up until last night.  Last night he had difficulty sleeping due to pain in the toe.  He reports subjective fever without chills.  Denies other complaints.  He has a bandage in place as well as a postop shoe that he was given in the hospital.  He does not report any changes to the skin or drainage from the wound, however he also reports that he has not been looking.  Spanish video interpreter used during history and physical.      Home Medications Prior to Admission medications   Medication Sig Start Date End Date Taking? Authorizing Provider  oxyCODONE (OXY IR/ROXICODONE) 5 MG immediate release tablet Take 0.5-1 tablets (2.5-5 mg total) by mouth every 6 (six) hours as needed for severe pain. 09/14/21  Yes Carlisle Cater, PA-C  Alogliptin Benzoate 6.25 MG TABS Take 1 tablet by mouth daily. 09/07/21 10/07/21  Autry-Lott, Naaman Plummer, DO  amLODipine (NORVASC) 10 MG tablet Take 1 tablet (10 mg total) by mouth daily. 09/07/21 01/05/22  Autry-Lott, Naaman Plummer, DO  atorvastatin (LIPITOR) 40 MG tablet Take 1 tablet (40 mg total) by mouth daily. Patient not taking: Reported on 09/04/2018 01/19/16   Boykin Nearing, MD  calcium acetate (PHOSLO) 667 MG capsule Take 1 capsule (667 mg total) by mouth 3 (three) times daily with meals. Patient not taking: Reported on 09/05/2021 11/10/14   Bonnielee Haff, MD  cinacalcet (SENSIPAR) 60 MG tablet Take 60 mg by  mouth daily with lunch.    [provider]  ferric citrate (AURYXIA) 1 GM 210 MG(Fe) tablet Take 420 mg by mouth 2 (two) times daily with a meal.    [provider]      Allergies    Metformin and related and Penicillins    Review of Systems   Review of Systems  Physical Exam Updated Vital Signs BP (!) 173/74 (BP Location: Left Arm)    Pulse 82    Temp 98 F (36.7 C) (Oral)    Resp 16    SpO2 99%  Physical Exam Vitals and nursing note reviewed.  Constitutional:      Appearance: He is well-developed.  HENT:     Head: Normocephalic and atraumatic.  Eyes:     Conjunctiva/sclera: Conjunctivae normal.  Pulmonary:     Effort: No respiratory distress.  Musculoskeletal:     Cervical back: Normal range of motion and neck supple.     Comments: Left foot: Second toe partial amputation.  Wound is intact without dehiscence and clean.  It is dry without signs of drainage.  No redness or warmth.  Sutures in place.  Appears to be well-healing.  No significant tenderness to palpation.  No obvious abscess or cellulitis.  Skin:    General: Skin is warm and dry.  Neurological:     Mental Status: He is alert.    ED Results / Procedures / Treatments  Labs (all labs ordered are listed, but only abnormal results are displayed) Labs Reviewed - No data to display  EKG None  Radiology No results found.  Procedures Procedures    Medications Ordered in ED Medications - No data to display  ED Course/ Medical Decision Making/ A&P                           Medical Decision Making Risk Prescription drug management.   Patient seen and examined. History obtained directly from patient using Spanish video interpreter as well as notes in epic from previous hospitalization and operation.  Labs/EKG: Not indicated.  Imaging: Not indicated.   Medications/Fluids: Not indicated.   Most recent vital signs reviewed and are as follows: BP (!) 173/74 (BP Location: Left Arm)     Pulse 82    Temp 98 F (36.7 C) (Oral)    Resp 16    SpO2 99%   Initial impression: Postoperative toe pain, no signs of infection.  H&P, work-up, and plan reviewed with: Patient using video interpreter.  Home treatment plan: Patient given a prescription for oxycodone to use for severe pain. Patient counseled on use of narcotic pain medications. Counseled not to combine these medications with others containing tylenol. Urged not to drink alcohol, drive, or perform any other activities that requires focus while taking these medications. The patient verbalizes understanding and agrees with the plan.  Patient states that he does not work due to poor vision and dialysis.  Return instructions discussed with patient: .Pt urged to return with worsening pain, worsening swelling, expanding area of redness or streaking up extremity, fever, or any other concerns. Pt verbalizes understanding and agrees with plan.  Follow-up instructions discussed with patient: PCP/surgeon as planned.    Patient with toe pain, approximately 9 days postop.  Pain worsened yesterday.  This raises concern for infection however on exam patient without significant tenderness, cellulitis, abscess.  Wound is clean dry and intact.  At time of exam, no evidence of abscess, cellulitis or other infection.          Final Clinical Impression(s) / ED Diagnoses Final diagnoses:  Left foot pain    Rx / DC Orders ED Discharge Orders          Ordered    oxyCODONE (OXY IR/ROXICODONE) 5 MG immediate release tablet  Every 6 hours PRN        09/14/21 1039              Carlisle Cater, PA-C 09/14/21 [place], DO 09/14/21 1616

## 2021-10-17 ENCOUNTER — Inpatient Hospital Stay: Payer: Self-pay | Admitting: Nurse Practitioner

## 2021-11-19 ENCOUNTER — Other Ambulatory Visit: Payer: Self-pay

## 2021-11-19 ENCOUNTER — Ambulatory Visit: Payer: Self-pay | Attending: Nurse Practitioner | Admitting: Nurse Practitioner

## 2021-11-19 ENCOUNTER — Encounter (HOSPITAL_COMMUNITY): Payer: Self-pay | Admitting: *Deleted

## 2021-11-19 ENCOUNTER — Encounter: Payer: Self-pay | Admitting: Nurse Practitioner

## 2021-11-19 ENCOUNTER — Other Ambulatory Visit: Payer: Self-pay | Admitting: Pharmacist

## 2021-11-19 VITALS — BP 221/95 | HR 81 | Wt 143.2 lb

## 2021-11-19 DIAGNOSIS — E785 Hyperlipidemia, unspecified: Secondary | ICD-10-CM

## 2021-11-19 DIAGNOSIS — Z992 Dependence on renal dialysis: Secondary | ICD-10-CM

## 2021-11-19 DIAGNOSIS — I152 Hypertension secondary to endocrine disorders: Secondary | ICD-10-CM

## 2021-11-19 DIAGNOSIS — Z7689 Persons encountering health services in other specified circumstances: Secondary | ICD-10-CM

## 2021-11-19 DIAGNOSIS — E1122 Type 2 diabetes mellitus with diabetic chronic kidney disease: Secondary | ICD-10-CM

## 2021-11-19 DIAGNOSIS — N186 End stage renal disease: Secondary | ICD-10-CM

## 2021-11-19 DIAGNOSIS — E1159 Type 2 diabetes mellitus with other circulatory complications: Secondary | ICD-10-CM

## 2021-11-19 DIAGNOSIS — E1169 Type 2 diabetes mellitus with other specified complication: Secondary | ICD-10-CM

## 2021-11-19 DIAGNOSIS — E1140 Type 2 diabetes mellitus with diabetic neuropathy, unspecified: Secondary | ICD-10-CM

## 2021-11-19 LAB — GLUCOSE, POCT (MANUAL RESULT ENTRY): POC Glucose: 274 mg/dl — AB (ref 70–99)

## 2021-11-19 MED ORDER — TRUE METRIX METER W/DEVICE KIT
PACK | 0 refills | Status: DC
  Filled 2021-11-19: qty 1, 365d supply, fill #0

## 2021-11-19 MED ORDER — GABAPENTIN 300 MG PO CAPS
300.0000 mg | ORAL_CAPSULE | Freq: Every day | ORAL | 3 refills | Status: DC
  Filled 2021-11-19: qty 30, 30d supply, fill #0

## 2021-11-19 MED ORDER — ATORVASTATIN CALCIUM 40 MG PO TABS
40.0000 mg | ORAL_TABLET | Freq: Every day | ORAL | 3 refills | Status: DC
  Filled 2021-11-19: qty 30, 30d supply, fill #0

## 2021-11-19 MED ORDER — LOSARTAN POTASSIUM 25 MG PO TABS
25.0000 mg | ORAL_TABLET | Freq: Every day | ORAL | 1 refills | Status: DC
  Filled 2021-11-19: qty 30, 30d supply, fill #0

## 2021-11-19 MED ORDER — SITAGLIPTIN PHOSPHATE 25 MG PO TABS
25.0000 mg | ORAL_TABLET | Freq: Every day | ORAL | 2 refills | Status: DC
Start: 2021-11-19 — End: 2021-12-25
  Filled 2021-11-19: qty 30, 30d supply, fill #0

## 2021-11-19 MED ORDER — TRUE METRIX BLOOD GLUCOSE TEST VI STRP
ORAL_STRIP | 12 refills | Status: DC
  Filled 2021-11-19: qty 100, 50d supply, fill #0

## 2021-11-19 MED ORDER — ALOGLIPTIN BENZOATE 6.25 MG PO TABS
1.0000 | ORAL_TABLET | Freq: Every day | ORAL | 1 refills | Status: DC
  Filled 2021-11-19: qty 30, 30d supply, fill #0

## 2021-11-19 MED ORDER — CLONIDINE HCL 0.2 MG PO TABS
0.2000 mg | ORAL_TABLET | Freq: Once | ORAL | Status: AC
  Administered 2021-11-19: 0.2 mg via ORAL

## 2021-11-19 MED ORDER — AMLODIPINE BESYLATE 10 MG PO TABS
10.0000 mg | ORAL_TABLET | Freq: Every day | ORAL | 3 refills | Status: DC
Start: 2021-11-19 — End: 2021-12-25
  Filled 2021-11-19: qty 30, 30d supply, fill #0

## 2021-11-19 MED ORDER — TRUEPLUS LANCETS 28G MISC
3 refills | Status: DC
  Filled 2021-11-19: qty 100, 50d supply, fill #0

## 2021-11-19 NOTE — Progress Notes (Signed)
? ?Assessment & Plan:  ?Brian Fuller was seen today for establish care. ? ?Diagnoses and all orders for this visit: ? ?Encounter to establish care ? ?Type 2 diabetes mellitus with chronic kidney disease on chronic dialysis, without long-term current use of insulin ([place]) ?Alogliptin switched to Janumet for cost effectiveness ?-     Blood Glucose Monitoring Suppl (TRUE METRIX METER) w/Device KIT; Use as instructed. Check blood glucose level by fingerstick twice per day. E11.65 ?-     glucose blood (TRUE METRIX BLOOD GLUCOSE TEST) test strip; Use as instructed. Check blood glucose level by fingerstick twice per day. E11.65 ?-     TRUEplus Lancets 28G MISC; Use as instructed. Check blood glucose level by fingerstick twice per day. E11.65 ? ? ?Hyperlipidemia associated with type 2 diabetes mellitus (HCC) ?-     POCT glucose (manual entry) ?-     atorvastatin (LIPITOR) 40 MG tablet; Take 1 tablet (40 mg total) by mouth daily. para el colesterol ?-     CMP14+EGFR ? ?Hypertension associated with type 2 diabetes mellitus ([place]) ?-     amLODipine (NORVASC) 10 MG tablet; Take 1 tablet (10 mg total) by mouth daily. para la presi?n arterial ?-     losartan (COZAAR) 25 MG tablet; Take 1 tablet (25 mg total) by mouth daily. para la presi?n arterial ?-     cloNIDine (CATAPRES) tablet 0.2 mg ?Advised to take his losartan and start amlodipine today.  If no improvement with blood pressure in the next few hours he needs to go to the emergency room for further treatment. ? ? ?Neuropathy due to type 2 diabetes mellitus (HCC) ?-     gabapentin (NEURONTIN) 300 MG capsule; Take 1 capsule (300 mg total) by mouth at bedtime. para el dolor en los pies ? ? ? ?Patient has been counseled on age-appropriate routine health concerns for screening and prevention. These are reviewed and up-to-date. Referrals have been placed accordingly. Immunizations are up-to-date or declined.    ?Subjective:  ? ?Chief Complaint  ?Patient presents with  ? Establish Care   ? ?HPI ?Brian Fuller 71 y.o. male presents to office today to establish care. 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 ? ? ?He only has 2 medication bottles with him today and 1 of those bottles is for losartan 25 mg and second bottle is for cinacalcet. ? ?He has a prescription label but not a bottle of the [place] that he has been prescribed. ? ? ?HTN ?Blood pressure is not controlled. He was given 0.$RemoveBef'2mg'EuvKxmqKrQ$  of clonidine today here in the office. He denies chest pain, worsening vision changes.  He is currently only taking losartan 25 mg daily.  Was unaware that he was supposed to also be taking amlodipine 10 mg daily which I will be represcribing for him today.  If blood pressure continues elevated we will likely need to start him on carvedilol or hydralazine.   ?BP Readings from Last 3 Encounters:  ?11/19/21 (!) 221/95  ?09/14/21 (!) 173/74  ?09/07/21 (!) 169/62  ?  ? ?DM ?Diagnosed with diabetes over 6 years ago.  Recently he was prescribed alogliptin in the hospital however he has not been taking and never picked it up from the pharmacy.  Due  to the cost of alogliptin and patient being noninsured we have switched to Januvia today.  LDL at goal with atorvastatin 40 mg daily.  He endorses hyperglycemic symptoms of bilateral peripheral neuropathy which is worse at night.  We will start him on gabapentin for this. ?Lab Results  ?Component Value Date  ? HGBA1C 7.9 (H) 09/05/2021  ?  ?Lab Results  ?Component Value Date  ? [place] 72 09/05/2021  ?  ?Review of Systems  ?Constitutional:  Negative for fever, malaise/fatigue and weight loss.  ?HENT: Negative.  Negative for  nosebleeds.   ?Eyes:  Positive for blurred vision (Loss of vision in left eye). Negative for double vision and photophobia.  ?Respiratory: Negative.  Negative for cough and shortness of breath.   ?Cardiovascular: Negative.  Negative for chest pain, palpitations and leg swelling.  ?Gastrointestinal: Negative.  Negative for heartburn, nausea and vomiting.  ?Musculoskeletal: Negative.  Negative for myalgias.  ?Neurological:  Positive for tingling and sensory change. Negative for dizziness, focal weakness, seizures and headaches.  ?Psychiatric/Behavioral: Negative.  Negative for suicidal ideas.   ? ?Past Medical History:  ?Diagnosis Date  ? Anemia   ? Chronic kidney disease   ? dialysis, T/Th/Sat  ? Diabetes mellitus without complication ([place])   ? Diabetic retinopathy ([place])   ? ESRD (end stage renal disease) ([place])   ? Glaucoma   ? Hypertension   ? Hyperthyroidism   ? Loss of vision 08/20/2015  ? Rt eye   ? Pneumonia   ? Systolic murmur   ? ? ?Past Surgical History:  ?Procedure Laterality Date  ? AMPUTATION TOE Left 09/05/2021  ? Procedure: AMPUTATION Left Second Toe;  Surgeon: Brian Sheng, MD;  Location: [place];  Service: Orthopedics;  Laterality: Left;  ? AV FISTULA PLACEMENT Left 11/02/2014  ? Procedure: ARTERIOVENOUS (AV) FISTULA CREATION;  Surgeon: Brian Misty, MD;  Location: [place];  Service: Vascular;  Laterality: Left;  ? AV FISTULA PLACEMENT Left 04/26/2015  ? Procedure: LEFT BRACHIOCEPHALIC ARTERIOVENOUS (AV) FISTULA CREATION;  Surgeon: Brian [place], MD;  Location: [place];  Service: Vascular;  Laterality: Left;  ? EYE SURGERY    ? INSERTION OF DIALYSIS CATHETER Right 11/04/2014  ? Procedure: INSERTION OF DIALYSIS CATHETER RIGHT INTERNAL JUGULAR VEIN;  Surgeon: Brian Misty, MD;  Location: [place];  Service: Vascular;  Laterality: Right;  ? LIGATION OF ARTERIOVENOUS  FISTULA Left 04/26/2015  ? Procedure: LIGATION OF LEFT RADIOCEPHALIC ARTERIOVENOUS  FISTULA;  Surgeon: Brian Kinsley, MD;  Location: [place];   Service: Vascular;  Laterality: Left;  ? REVISON OF ARTERIOVENOUS FISTULA Left 01/18/2015  ? Procedure: REVISON OF LEFT RADIOCEPHALIC ARTERIOVENOUS FISTULA USING VASCU-GUARD PERIPHERAL VASCULAR PATCH ;  Surgeon: Brian Misty, MD;  Location: [place];  Service: Vascular;  Laterality: Left;  ? TONSILLECTOMY    ? ? ?Family History  ?Problem Relation Age of Onset  ? Diabetes Mother   ? Diabetes Father   ? Diabetes Brother   ? Hypertension Sister   ? ? ?Social History Reviewed with no changes to be made today.  ? ?Outpatient Medications Prior to Visit  ?Medication Sig Dispense Refill  ? ferric citrate (AURYXIA) 1 GM 210 MG(Fe) tablet Take 420 mg by mouth 2 (two) times daily with a meal.    ? calcium acetate (PHOSLO) 667 MG capsule Take 1 capsule (667 mg total) by mouth 3 (three) times daily with meals. (Patient not taking: Reported on 09/05/2021) 90 capsule 0  ? cinacalcet (SENSIPAR) 60 MG  tablet Take 60 mg by mouth daily with lunch.    ? oxyCODONE (OXY IR/ROXICODONE) 5 MG immediate release tablet Take 0.5-1 tablets (2.5-5 mg total) by mouth every 6 (six) hours as needed for severe pain. 10 tablet 0  ? Alogliptin Benzoate 6.25 MG TABS Take 1 tablet by mouth daily. 30 tablet 0  ? amLODipine (NORVASC) 10 MG tablet Take 1 tablet (10 mg total) by mouth daily. 30 tablet 3  ? atorvastatin (LIPITOR) 40 MG tablet Take 1 tablet (40 mg total) by mouth daily. (Patient not taking: Reported on 09/04/2018) 90 tablet 3  ? ?No facility-administered medications prior to visit.  ? ? ?Allergies  ?Allergen Reactions  ? Metformin And Related Other (See Comments)  ?  Metformin contraindication in ESRD  ? Penicillins Rash  ? ? ?   ?Objective:  ?  ?BP (!) 221/95 (BP Location: Right Arm, Cuff Size: Small)   Pulse 81   Wt 143 lb 3.2 oz (65 kg)   SpO2 99%   BMI 25.37 kg/m?  ?Wt Readings from Last 3 Encounters:  ?11/19/21 143 lb 3.2 oz (65 kg)  ?09/06/21 143 lb 8.3 oz (65.1 kg)  ?09/04/18 146 lb 9.7 oz (66.5 kg)  ? ? ?Physical Exam ?Vitals and  nursing note reviewed.  ?Constitutional:   ?   Appearance: He is well-developed.  ?HENT:  ?   Head: Normocephalic and atraumatic.  ?Cardiovascular:  ?   Rate and Rhythm: Normal rate and regular rhythm.  ?   Heart sound

## 2021-11-19 NOTE — Patient Instructions (Signed)
La glucosa en sangre por la ma?ana debe estar entre 56 y 130. ? ? ? ?La glucosa en sangre 1-2 horas despu?s de la cena debe ser inferior a 180 ?

## 2021-11-20 LAB — CMP14+EGFR
ALT: 13 IU/L (ref 0–44)
AST: 15 IU/L (ref 0–40)
Albumin/Globulin Ratio: 1.7 (ref 1.2–2.2)
Albumin: 4.5 g/dL (ref 3.8–4.8)
Alkaline Phosphatase: 266 IU/L — ABNORMAL HIGH (ref 44–121)
BUN/Creatinine Ratio: 8 — ABNORMAL LOW (ref 10–24)
BUN: 54 mg/dL — ABNORMAL HIGH (ref 8–27)
Bilirubin Total: 0.3 mg/dL (ref 0.0–1.2)
CO2: 28 mmol/L (ref 20–29)
Calcium: 8.3 mg/dL — ABNORMAL LOW (ref 8.6–10.2)
Chloride: 91 mmol/L — ABNORMAL LOW (ref 96–106)
Creatinine, Ser: 7.11 mg/dL — ABNORMAL HIGH (ref 0.76–1.27)
Globulin, Total: 2.7 g/dL (ref 1.5–4.5)
Glucose: 283 mg/dL — ABNORMAL HIGH (ref 70–99)
Potassium: 5.1 mmol/L (ref 3.5–5.2)
Sodium: 135 mmol/L (ref 134–144)
Total Protein: 7.2 g/dL (ref 6.0–8.5)
eGFR: 8 mL/min/{1.73_m2} — ABNORMAL LOW (ref >59)

## 2021-11-21 ENCOUNTER — Other Ambulatory Visit: Payer: Self-pay | Admitting: Nurse Practitioner

## 2021-11-21 DIAGNOSIS — R748 Abnormal levels of other serum enzymes: Secondary | ICD-10-CM

## 2021-11-26 ENCOUNTER — Other Ambulatory Visit: Payer: Self-pay

## 2021-12-11 ENCOUNTER — Ambulatory Visit: Payer: Self-pay | Admitting: Pharmacist

## 2021-12-20 ENCOUNTER — Ambulatory Visit: Payer: Self-pay | Admitting: *Deleted

## 2021-12-20 NOTE — Telephone Encounter (Signed)
?  Summary: Medication Management.  ? Pt friend Helen Hashimoto called in and stated pt is taking a medication for epilepsy that he shouldn't be taking and that makes pt dizzy.  ?Pt friend does not have the name of the medication.  Friend stated she isn't sure who told pt that medication is for epilepsy but pt feels dizzy and drugged when taking.  ? ?Please call pt back to discuss friend is worried about pt .  ? ?Please call pt back directly [phone]  ? ?Pt needs interpreter in [place].   ?  ? ? ?Called patient via interpreter Lona Millard ID # [phone] to review medication questions and symptoms. ? ?Chief Complaint: requesting to stop taking medication that is causing dizziness and feeling "drugged" ?Symptoms: no symptoms now. Patient reports he has stopped taking a medication that is oval yellow and white. Patient reports he can not see letters on bottle due to poor vision and no one is home to read the bottle. C/o feeling tired d/t just finishing HD today .  ?Frequency: na ?Pertinent Negatives: Patient denies any symptoms of dizziness or feeling drugged ?Disposition: [] ED /[] Urgent Care (no appt availability in office) / [] Appointment(In office/virtual)/ []  [facility]/ [] Home Care/ [] Refused Recommended Disposition /[] [facility]/ [x]  Follow-up with PCP ?Additional Notes:  ? ?Requested patient to have family or L[place] Junes friend to call back to read name of medication he has stopped taking . Reports since stopping medication, he has not had any episodes of sx. Reports he will have family to call clinic back to report name of medication. In review of medications, possible gabapentin . ? ? ? ? ? ?Reason for Disposition ? [1] Caller has NON-URGENT medicine question about med that PCP prescribed AND [2] triager unable to answer question ? ?Answer Assessment - Initial Assessment Questions ?1. NAME of MEDICATION: "What medicine are you calling about?" ?    Not sure reports it is for epilepsy ?2. QUESTION:  "What is your question?" (e.g., double dose of medicine, side effect) ?    A medication is causing sx of feeling dizzy and "drugged" ?3. PRESCRIBING HCP: "Who prescribed it?" Reason: if prescribed by specialist, call should be referred to that group. ?   PCP  ?4. SYMPTOMS: "Do you have any symptoms?" ?    None now  ?5. SEVERITY: If symptoms are present, ask "Are they mild, moderate or severe?" ?    na ?6. PREGNANCY:  "Is there any chance that you are pregnant?" "When was your last menstrual period?" ?    na ? ?Protocols used: Medication Question Call-A-AH ? ?

## 2021-12-20 NOTE — Telephone Encounter (Signed)
Patient was called and he states that he has stopped taking the gabapentin medication due to it making him dizzy, does patient need a replacement? ?

## 2021-12-24 ENCOUNTER — Other Ambulatory Visit: Payer: Self-pay

## 2021-12-25 ENCOUNTER — Encounter: Payer: Self-pay | Admitting: Nurse Practitioner

## 2021-12-25 ENCOUNTER — Other Ambulatory Visit: Payer: Self-pay

## 2021-12-25 ENCOUNTER — Encounter (HOSPITAL_COMMUNITY): Payer: Self-pay | Admitting: *Deleted

## 2021-12-25 ENCOUNTER — Ambulatory Visit (HOSPITAL_BASED_OUTPATIENT_CLINIC_OR_DEPARTMENT_OTHER): Payer: Self-pay | Admitting: Nurse Practitioner

## 2021-12-25 DIAGNOSIS — I152 Hypertension secondary to endocrine disorders: Secondary | ICD-10-CM

## 2021-12-25 DIAGNOSIS — E1122 Type 2 diabetes mellitus with diabetic chronic kidney disease: Secondary | ICD-10-CM

## 2021-12-25 DIAGNOSIS — N186 End stage renal disease: Secondary | ICD-10-CM

## 2021-12-25 DIAGNOSIS — E1169 Type 2 diabetes mellitus with other specified complication: Secondary | ICD-10-CM

## 2021-12-25 DIAGNOSIS — E785 Hyperlipidemia, unspecified: Secondary | ICD-10-CM

## 2021-12-25 DIAGNOSIS — Z992 Dependence on renal dialysis: Secondary | ICD-10-CM

## 2021-12-25 DIAGNOSIS — E1159 Type 2 diabetes mellitus with other circulatory complications: Secondary | ICD-10-CM

## 2021-12-25 MED ORDER — TRUE METRIX METER W/DEVICE KIT
PACK | 0 refills | Status: AC
  Filled 2021-12-25: qty 1, fill #0
  Filled 2022-09-16: qty 1, 30d supply, fill #0

## 2021-12-25 MED ORDER — SITAGLIPTIN PHOSPHATE 25 MG PO TABS
25.0000 mg | ORAL_TABLET | Freq: Every day | ORAL | 0 refills | Status: DC
  Filled 2021-12-25: qty 90, 90d supply, fill #0

## 2021-12-25 MED ORDER — TRUE METRIX BLOOD GLUCOSE TEST VI STRP
ORAL_STRIP | 12 refills | Status: AC
  Filled 2021-12-25: qty 100, fill #0
  Filled 2022-09-16: qty 100, 50d supply, fill #0

## 2021-12-25 MED ORDER — AMLODIPINE BESYLATE 10 MG PO TABS
10.0000 mg | ORAL_TABLET | Freq: Every day | ORAL | 0 refills | Status: DC
  Filled 2021-12-25: qty 90, 90d supply, fill #0

## 2021-12-25 MED ORDER — LOSARTAN POTASSIUM 25 MG PO TABS
25.0000 mg | ORAL_TABLET | Freq: Every day | ORAL | 1 refills | Status: DC
Start: 2021-12-25 — End: 2022-02-22
  Filled 2021-12-25: qty 90, 90d supply, fill #0

## 2021-12-25 MED ORDER — ATORVASTATIN CALCIUM 10 MG PO TABS
10.0000 mg | ORAL_TABLET | Freq: Every day | ORAL | 0 refills | Status: DC
  Filled 2021-12-25: qty 90, 90d supply, fill #0

## 2021-12-25 MED ORDER — TRUEPLUS LANCETS 28G MISC
3 refills | Status: AC
  Filled 2021-12-25: qty 100, fill #0
  Filled 2022-09-16: qty 100, 50d supply, fill #0

## 2021-12-25 NOTE — Progress Notes (Signed)
Virtual Visit via Telephone Note ? I discussed the limitations, risks, security and privacy concerns of performing an evaluation and management service by telephone and the availability of in person appointments. I also discussed with the patient that there may be a patient responsible charge related to this service. The patient expressed understanding and agreed to proceed.  ? ? ?I connected with [name] on 12/25/21  at   2:30 PM EDT  EDT by telephone and verified that I am speaking with the correct person using two identifiers. ? ?Location of Patient: ?Private Residence ?  ?Location of Provider: ?Scientist, research (physical sciences) and [employer] Office  ?  ?Persons participating in Telemedicine visit: ?Geryl Rankins FNP-BC ?[name]  ?Spanish interpreter Trudee Grip [phone] ?  ?History of Present Illness: ?Telemedicine visit for: Medication side effect ?He has a past medical history of Anemia, Chronic kidney disease, DM 2, Diabetic retinopathy, ESRD, Glaucoma, Hypertension, Hyperthyroidism, Loss of vision (08/20/2015), Pneumonia, and Systolic murmur.  ? ?He was started on gabapentin last month for neuropathy but begin to experience dizziness and near syncope so he stopped taking this. He states the dizziness resolved after he stopped taking gabapentin. At this time he states his neuropathy is bearable and he does not wish to start any new medications ?He tells me today he did not pick up Januvia in April as he could not afford this medication. Will send to the community pharmacy at this time as he is uninsured.  ?Lab Results  ?Component Value Date  ? HGBA1C 7.9 (H) 09/05/2021  ?  ? ? ? ?Past Medical History:  ?Diagnosis Date  ? Anemia   ? Chronic kidney disease   ? dialysis, T/Th/Sat  ? Diabetes mellitus without complication ([place])   ? Diabetic retinopathy ([place])   ? ESRD (end stage renal disease) ([place])   ? Glaucoma   ? Hypertension   ? Hyperthyroidism   ? Loss of vision 08/20/2015  ? Rt eye   ? Pneumonia    ? Systolic murmur   ?  ?Past Surgical History:  ?Procedure Laterality Date  ? AMPUTATION TOE Left 09/05/2021  ? Procedure: AMPUTATION Left Second Toe;  Surgeon: Willaim Sheng, MD;  Location: [place];  Service: Orthopedics;  Laterality: Left;  ? AV FISTULA PLACEMENT Left 11/02/2014  ? Procedure: ARTERIOVENOUS (AV) FISTULA CREATION;  Surgeon: Mal Misty, MD;  Location: [facility];  Service: Vascular;  Laterality: Left;  ? AV FISTULA PLACEMENT Left 04/26/2015  ? Procedure: LEFT BRACHIOCEPHALIC ARTERIOVENOUS (AV) FISTULA CREATION;  Surgeon: Conrad Mantador, MD;  Location: [facility];  Service: Vascular;  Laterality: Left;  ? EYE SURGERY    ? INSERTION OF DIALYSIS CATHETER Right 11/04/2014  ? Procedure: INSERTION OF DIALYSIS CATHETER RIGHT INTERNAL JUGULAR VEIN;  Surgeon: Mal Misty, MD;  Location: [facility];  Service: Vascular;  Laterality: Right;  ? LIGATION OF ARTERIOVENOUS  FISTULA Left 04/26/2015  ? Procedure: LIGATION OF LEFT RADIOCEPHALIC ARTERIOVENOUS  FISTULA;  Surgeon: Conrad [place], MD;  Location: [facility];  Service: Vascular;  Laterality: Left;  ? REVISON OF ARTERIOVENOUS FISTULA Left 01/18/2015  ? Procedure: REVISON OF LEFT RADIOCEPHALIC ARTERIOVENOUS FISTULA USING VASCU-GUARD PERIPHERAL VASCULAR PATCH ;  Surgeon: Mal Misty, MD;  Location: [place];  Service: Vascular;  Laterality: Left;  ? TONSILLECTOMY    ?  ?Family History  ?Problem Relation Age of Onset  ? Diabetes Mother   ? Diabetes Father   ? Diabetes Brother   ? Hypertension Sister   ?  ?Social  History  ? ?Socioeconomic History  ? Marital status: Married  ?  Spouse name: Not on file  ? Number of children: Not on file  ? Years of education: Not on file  ? Highest education level: Not on file  ?Occupational History  ? Not on file  ?Tobacco Use  ? Smoking status: Former  ?  Types: Cigarettes  ?  Quit date: 01/17/1984  ?  Years since quitting: 37.9  ? Smokeless tobacco: Never  ?Substance and Sexual Activity  ? Alcohol use: No  ?  Alcohol/week: 0.0 standard drinks  ?  Drug use: No  ? Sexual activity: Not on file  ?Other Topics Concern  ? Not on file  ?Social History Narrative  ? Not on file  ? ?Social Determinants of Health  ? ?Financial Resource Strain: Not on file  ?Food Insecurity: Not on file  ?Transportation Needs: Not on file  ?Physical Activity: Not on file  ?Stress: Not on file  ?Social Connections: Not on file  ?  ? ?Observations/Objective: ?Awake, alert and oriented x 3 ? ? ?Review of Systems  ?Constitutional:  Negative for fever, malaise/fatigue and weight loss.  ?HENT: Negative.  Negative for nosebleeds.   ?Eyes: Negative.  Negative for blurred vision, double vision and photophobia.  ?Respiratory: Negative.  Negative for cough and shortness of breath.   ?Cardiovascular: Negative.  Negative for chest pain, palpitations and leg swelling.  ?Gastrointestinal: Negative.  Negative for heartburn, nausea and vomiting.  ?Musculoskeletal: Negative.  Negative for myalgias.  ?Neurological: Negative.  Negative for dizziness, focal weakness, seizures and headaches.  ?Psychiatric/Behavioral: Negative.  Negative for suicidal ideas.    ?Assessment and Plan: ?Diagnoses and all orders for this visit: ? ?Hypertension associated with type 2 diabetes mellitus ([place]) ?-     amLODipine (NORVASC) 10 MG tablet; Take 1 tablet (10 mg total) by mouth daily. para la presi n arterial. ?-     losartan (COZAAR) 25 MG tablet; Take 1 tablet (25 mg total) by mouth daily. para la presi n arterial NEEDS PASS 90 day supply ? ?Hyperlipidemia associated with type 2 diabetes mellitus (HCC) ?-     atorvastatin (LIPITOR) 10 MG tablet; Take 1 tablet (10 mg total) by mouth daily. para el colesterol ? ?Type 2 diabetes mellitus with chronic kidney disease on chronic dialysis, without long-term current use of insulin (HCC) ?-     sitaGLIPtin (JANUVIA) 25 MG tablet; Take 1 tablet (25 mg total) by mouth daily. para el az Engineer, mining NEEDS PASS 90 day supply ?-     TRUEplus Lancets 28G MISC; Use as instructed.  Check blood glucose level by fingerstick twice per day ?-     Blood Glucose Monitoring Suppl (TRUE METRIX METER) w/Device KIT; Use as instructed. Check blood glucose level by fingerstick twice per day. E11.65 ?-     glucose blood (TRUE METRIX BLOOD GLUCOSE TEST) test strip; Use as instructed. Check blood glucose level by fingerstick twice per day. E11.65 ? ?  ? ?Follow Up Instructions ?Return in about 2 months (around 02/24/2022).  ? ?  ?I discussed the assessment and treatment plan with the patient. The patient was provided an opportunity to ask questions and all were answered. The patient agreed with the plan and demonstrated an understanding of the instructions. ?  ?The patient was advised to call back or seek an in-person evaluation if the symptoms worsen or if the condition fails to improve as anticipated. ? ?I provided 12 minutes of non-face-to-face time  during this encounter including median intraservice time, reviewing previous notes, labs, imaging, medications and explaining diagnosis and management. ? ?Gildardo Pounds, FNP-BC  ?

## 2021-12-25 NOTE — Telephone Encounter (Signed)
Please let patient know I will call him at [phone] today to discuss. Thanks! ?

## 2021-12-26 ENCOUNTER — Encounter (HOSPITAL_COMMUNITY): Payer: Self-pay | Admitting: *Deleted

## 2021-12-26 ENCOUNTER — Other Ambulatory Visit: Payer: Self-pay

## 2021-12-27 ENCOUNTER — Telehealth: Payer: Self-pay | Admitting: Pharmacist

## 2021-12-27 ENCOUNTER — Encounter (HOSPITAL_COMMUNITY): Payer: Self-pay | Admitting: *Deleted

## 2021-12-27 ENCOUNTER — Telehealth: Payer: Self-pay | Admitting: Nurse Practitioner

## 2021-12-27 ENCOUNTER — Other Ambulatory Visit: Payer: Self-pay

## 2021-12-27 ENCOUNTER — Other Ambulatory Visit: Payer: Self-pay | Admitting: Nurse Practitioner

## 2021-12-27 DIAGNOSIS — E1122 Type 2 diabetes mellitus with diabetic chronic kidney disease: Secondary | ICD-10-CM

## 2021-12-27 MED ORDER — SITAGLIPTIN PHOSPHATE 25 MG PO TABS
25.0000 mg | ORAL_TABLET | Freq: Every day | ORAL | 0 refills | Status: DC
  Filled 2021-12-27: qty 90, 90d supply, fill #0

## 2021-12-27 NOTE — Telephone Encounter (Signed)
If patient can not afford Januvia will need to do low dose lantus. Will this be cost effective? ?

## 2021-12-27 NOTE — Telephone Encounter (Signed)
I spoke to [place] this morning, apparently she helps Mr. Sheran Fava with his medications.  She was informed that patient needs to enroll in patient assistance and I emailed her the app which was received and acknowledged.  PASS is available.  She had asked that it be changed to something generic and I advised that there may not be a therapy available to change to and that PASS would still need to be obtained.  She wanted to check anyways. ?

## 2021-12-27 NOTE — Telephone Encounter (Signed)
Copied from [place] [id number]. Topic: General - Other ?>> Dec 27, 2021 10:17 AM Leward Quan A wrote: ?Reason for CRM: Patient wife called in asking to speak to Geryl Rankins that the sitaGLIPtin (JANUVIA) 25 MG tablet is no longer covered in the program that patient was getting his medication from. Also request that [place] speak to the pharmacy and see what other medications are no longer covered under this progaram so that they can have an alternative. Please advise and call  Ph# [phone] ?

## 2021-12-27 NOTE — Telephone Encounter (Signed)
Brian Fuller would you be able to look into this  ?

## 2021-12-27 NOTE — Telephone Encounter (Signed)
Brian Fuller,  ? ?I did not hear about this from pharmacy so I just want to confirm. Is Januvia no longer available for this patient via PASS? ?

## 2021-12-27 NOTE — Telephone Encounter (Signed)
Will forward to provider  

## 2021-12-28 ENCOUNTER — Other Ambulatory Visit: Payer: Self-pay

## 2022-01-02 NOTE — Telephone Encounter (Signed)
[name] calling for pt.  States she has not heard of any alternative med. Questioning if she received  'PASS' application, states she did receive. Frustrated affect, states that will take a while to process and he needs something now. "How can I trust that it will even be processed."   ?Would like CB [phone] ?Please advise. ?

## 2022-01-02 NOTE — Telephone Encounter (Signed)
Brian Fuller,  ? ?It looks like we documented last week that [name] acknowledged receipt of the PASS application via email. She is complaining that she has not gotten it. There is no generic available for Januvia. If pt is seeking a therapeutic alternative to a different class of medication, he will need to be seen.  ?  ?

## 2022-01-03 ENCOUNTER — Telehealth: Payer: Self-pay | Admitting: Nurse Practitioner

## 2022-01-03 NOTE — Telephone Encounter (Signed)
Copied from [place] [id number]. Topic: General - Inquiry >> Jan 03, 2022 11:10 AM Robina Ade, Helene Kelp D wrote: Reason for CRM: Patient's friend Brian Fuller called and stated that she needs to talk to a patients provider about patient and diabetes medication concern. She said that the wrong people have been returning her call and no one is understanding what patient needs and her request. Please call Ms. Norma back, thanks.

## 2022-01-04 ENCOUNTER — Telehealth: Payer: Self-pay | Admitting: Nurse Practitioner

## 2022-01-04 ENCOUNTER — Other Ambulatory Visit: Payer: Self-pay | Admitting: Nurse Practitioner

## 2022-01-04 DIAGNOSIS — E1122 Type 2 diabetes mellitus with diabetic chronic kidney disease: Secondary | ICD-10-CM

## 2022-01-04 MED ORDER — BD PEN NEEDLE MINI U/F 31G X 5 MM MISC: 3 refills | Status: DC

## 2022-01-04 MED ORDER — LANTUS SOLOSTAR 100 UNIT/ML ~~LOC~~ SOPN: 10.0000 [IU] | PEN_INJECTOR | Freq: Every day | SUBCUTANEOUS | 99 refills | Status: DC

## 2022-01-04 NOTE — Telephone Encounter (Signed)
Pts friend [place] is calling to report that Semglee is $290. Wanting to know if which OTC pen. There are there are three pens. Which OTC pen is compatable with Semglee?    Was told by the nurse since the pt is is on dialysis pt is can not take glipizide. Pt read that the pt can take glipizide  [phone]

## 2022-01-04 NOTE — Telephone Encounter (Signed)
Spk to Brian Fuller.  States she would like to know if medication such as glipizide or metformin can be sent to pharmacy.   Asked PCP this question.  Due to kidney function and being on dialysis, that medication is not suitable for patient.   PCP will send insulin to pharmacy.  Brian Fuller aware.   Brian Fuller states she will try to come to pharmacy to complete PASS information next week.

## 2022-01-07 ENCOUNTER — Other Ambulatory Visit: Payer: Self-pay | Admitting: Nurse Practitioner

## 2022-01-07 DIAGNOSIS — E1122 Type 2 diabetes mellitus with diabetic chronic kidney disease: Secondary | ICD-10-CM

## 2022-01-07 MED ORDER — NOVOLOG 70/30 FLEXPEN RELION (70-30) 100 UNIT/ML ~~LOC~~ SUPN: 10.0000 [IU] | PEN_INJECTOR | Freq: Two times a day (BID) | SUBCUTANEOUS | 6 refills | Status: DC

## 2022-01-07 NOTE — Telephone Encounter (Signed)
Brian Fuller call was returned and transferred to PCP

## 2022-01-07 NOTE — Telephone Encounter (Signed)
Switched to relion 70/30

## 2022-01-25 ENCOUNTER — Other Ambulatory Visit: Payer: Self-pay

## 2022-01-29 ENCOUNTER — Other Ambulatory Visit: Payer: Self-pay

## 2022-02-22 ENCOUNTER — Telehealth: Payer: Self-pay

## 2022-02-22 ENCOUNTER — Ambulatory Visit: Payer: Self-pay | Attending: Nurse Practitioner | Admitting: Nurse Practitioner

## 2022-02-22 ENCOUNTER — Other Ambulatory Visit: Payer: Self-pay

## 2022-02-22 ENCOUNTER — Encounter (HOSPITAL_COMMUNITY): Payer: Self-pay | Admitting: *Deleted

## 2022-02-22 ENCOUNTER — Encounter: Payer: Self-pay | Admitting: Nurse Practitioner

## 2022-02-22 ENCOUNTER — Other Ambulatory Visit: Payer: Self-pay | Admitting: Nurse Practitioner

## 2022-02-22 VITALS — BP 170/73 | HR 82 | Temp 97.7°F | Resp 16 | Wt 142.0 lb

## 2022-02-22 DIAGNOSIS — N186 End stage renal disease: Secondary | ICD-10-CM

## 2022-02-22 DIAGNOSIS — E1122 Type 2 diabetes mellitus with diabetic chronic kidney disease: Secondary | ICD-10-CM

## 2022-02-22 DIAGNOSIS — E785 Hyperlipidemia, unspecified: Secondary | ICD-10-CM

## 2022-02-22 DIAGNOSIS — E1169 Type 2 diabetes mellitus with other specified complication: Secondary | ICD-10-CM

## 2022-02-22 DIAGNOSIS — E1159 Type 2 diabetes mellitus with other circulatory complications: Secondary | ICD-10-CM

## 2022-02-22 DIAGNOSIS — I152 Hypertension secondary to endocrine disorders: Secondary | ICD-10-CM

## 2022-02-22 DIAGNOSIS — Z992 Dependence on renal dialysis: Secondary | ICD-10-CM

## 2022-02-22 LAB — POCT GLYCOSYLATED HEMOGLOBIN (HGB A1C): HbA1c, POC (controlled diabetic range): 7.7 % — AB (ref 0.0–7.0)

## 2022-02-22 LAB — GLUCOSE, POCT (MANUAL RESULT ENTRY): POC Glucose: 181 mg/dl — AB (ref 70–99)

## 2022-02-22 MED ORDER — NOVOLOG 70/30 FLEXPEN RELION (70-30) 100 UNIT/ML ~~LOC~~ SUPN
10.0000 [IU] | PEN_INJECTOR | Freq: Two times a day (BID) | SUBCUTANEOUS | 6 refills | Status: DC
  Filled 2022-02-22: qty 6, 30d supply, fill #0

## 2022-02-22 MED ORDER — LOSARTAN POTASSIUM 25 MG PO TABS
25.0000 mg | ORAL_TABLET | Freq: Every day | ORAL | 1 refills | Status: DC
Start: 2022-02-22 — End: 2023-01-09
  Filled 2022-02-22 – 2022-07-10 (×2): qty 90, 90d supply, fill #0
  Filled 2022-10-30: qty 90, 90d supply, fill #1

## 2022-02-22 MED ORDER — INSULIN LISPRO PROT & LISPRO (75-25 MIX) 100 UNIT/ML KWIKPEN
10.0000 [IU] | PEN_INJECTOR | Freq: Two times a day (BID) | SUBCUTANEOUS | 2 refills | Status: DC
  Filled 2022-02-22 – 2022-07-10 (×2): qty 6, 30d supply, fill #0

## 2022-02-22 MED ORDER — INSULIN LISPRO PROT & LISPRO (75-25 MIX) 100 UNIT/ML KWIKPEN
10.0000 [IU] | PEN_INJECTOR | Freq: Two times a day (BID) | SUBCUTANEOUS | 1 refills | Status: DC
Start: 2022-02-22 — End: 2022-07-31
  Filled 2022-02-22: qty 6, 30d supply, fill #0
  Filled 2022-04-16 – 2022-05-07 (×2): qty 6, 30d supply, fill #1

## 2022-02-22 MED ORDER — CARVEDILOL 3.125 MG PO TABS
3.1250 mg | ORAL_TABLET | Freq: Two times a day (BID) | ORAL | 3 refills | Status: DC
  Filled 2022-02-22: qty 60, 30d supply, fill #0
  Filled 2022-07-10: qty 60, 30d supply, fill #1

## 2022-02-22 MED ORDER — ATORVASTATIN CALCIUM 10 MG PO TABS
10.0000 mg | ORAL_TABLET | Freq: Every day | ORAL | 0 refills | Status: DC
  Filled 2022-02-22: qty 90, 90d supply, fill #0

## 2022-02-22 MED ORDER — SITAGLIPTIN PHOSPHATE 25 MG PO TABS
25.0000 mg | ORAL_TABLET | Freq: Every day | ORAL | 1 refills | Status: DC
  Filled 2022-02-22: qty 90, 90d supply, fill #0

## 2022-02-22 MED ORDER — AMLODIPINE BESYLATE 10 MG PO TABS
10.0000 mg | ORAL_TABLET | Freq: Every day | ORAL | 1 refills | Status: DC
  Filled 2022-02-22 – 2022-07-10 (×2): qty 90, 90d supply, fill #0
  Filled 2022-10-30: qty 90, 90d supply, fill #1

## 2022-02-22 NOTE — Progress Notes (Signed)
Concerns about Januvia. Does not feel it is as effective Neuropathy pain in feet and legs

## 2022-02-22 NOTE — Progress Notes (Signed)
Assessment & Plan:  Brian Fuller was seen today for hypertension, diabetes and peripheral neuropathy.  Diagnoses and all orders for this visit:  Type 2 diabetes mellitus with chronic kidney disease on chronic dialysis, without long-term current use of insulin (HCC) -     HgB A1c -     Glucose (CBG) -     insulin aspart protamine - aspart (NOVOLOG 70/30 FLEXPEN) (70-30) 100 UNIT/ML FlexPen; Inject 10 Units into the skin 2 (two) times daily with a meal. -     sitaGLIPtin (JANUVIA) 25 MG tablet; Take 1 tablet (25 mg total) by mouth daily. para el az car en la sangre  Hypertension associated with type 2 diabetes mellitus (HCC) -     losartan (COZAAR) 25 MG tablet; Take 1 tablet (25 mg total) by mouth daily. para la presi n arterial -     amLODipine (NORVASC) 10 MG tablet; Take 1 tablet (10 mg total) by mouth daily. para la presi n arterial. ADDING  NEW BP MED: carvedilol (COREG) 3.125 MG tablet; Take 1 tablet (3.125 mg total) by mouth 2 (two) times daily with a meal. PARA LA PRECION  Hyperlipidemia associated with type 2 diabetes mellitus (HCC) -     atorvastatin (LIPITOR) 10 MG tablet; Take 1 tablet (10 mg total) by mouth daily. para el colesterol    Patient has been counseled on age-appropriate routine health concerns for screening and prevention. These are reviewed and up-to-date. Referrals have been placed accordingly. Immunizations are up-to-date or declined.    Subjective:   Chief Complaint  Patient presents with   Hypertension   Diabetes        Peripheral Neuropathy   [name] 71 y.o. male presents to office today for follow-up to hypertension and diabetes.  He is accompanied by a family friend who is helping to interpret today.   HTN Blood pressure is elevated despite his endorsement of taking amlodipine 10 mg daily and losartan 25 mg daily as prescribed.  We will add low-dose carvedilol 3.125 mg twice daily today. BP Readings from Last 3 Encounters:   02/22/22 (!) 170/73  11/19/21 (!) 221/95  09/14/21 (!) 173/74    DM2 Does not feel like Januvia is working due to higher blood glucose readings at home.  Unfortunately due to language barrier when he was started on Januvia patient thought he was supposed to stop NovoLog 70/30 10 units twice daily.  I have instructed him that he should continue on both as of today and we will continue to monitor his blood glucose levels.  LDL at goal with low intensity statin Lab Results  Component Value Date   HGBA1C 7.7 (A) 02/22/2022    Lab Results  Component Value Date   HGBA1C 7.9 (H) 09/05/2021     Lab Results  Component Value Date   LDLCALC 71 09/05/2021       Review of Systems  Constitutional:  Negative for fever, malaise/fatigue and weight loss.  HENT: Negative.  Negative for nosebleeds.   Eyes: Negative.  Negative for blurred vision, double vision and photophobia.  Respiratory: Negative.  Negative for cough and shortness of breath.   Cardiovascular: Negative.  Negative for chest pain, palpitations and leg swelling.  Gastrointestinal: Negative.  Negative for heartburn, nausea and vomiting.  Musculoskeletal: Negative.  Negative for myalgias.  Neurological: Negative.  Negative for dizziness, focal weakness, seizures and headaches.  Psychiatric/Behavioral: Negative.  Negative for suicidal ideas.     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    Pneumonia    Systolic murmur     Past Surgical History:  Procedure Laterality Date   AMPUTATION TOE Left 09/05/2021   Procedure: AMPUTATION Left Second Toe;  Surgeon: Willaim Sheng, MD;  Location: [facility];  Service: Orthopedics;  Laterality: Left;   AV FISTULA PLACEMENT Left  11/02/2014   Procedure: ARTERIOVENOUS (AV) FISTULA CREATION;  Surgeon: Mal Misty, MD;  Location: [facility];  Service: Vascular;  Laterality: Left;   AV FISTULA PLACEMENT Left 04/26/2015   Procedure: LEFT BRACHIOCEPHALIC ARTERIOVENOUS (AV) FISTULA CREATION;  Surgeon: Conrad Rendon, MD;  Location: [facility];  Service: Vascular;  Laterality: Left;   EYE SURGERY     INSERTION OF DIALYSIS CATHETER Right 11/04/2014   Procedure: INSERTION OF DIALYSIS CATHETER RIGHT INTERNAL JUGULAR VEIN;  Surgeon: Mal Misty, MD;  Location: [facility];  Service: Vascular;  Laterality: Right;   LIGATION OF ARTERIOVENOUS  FISTULA Left 04/26/2015   Procedure: LIGATION OF LEFT RADIOCEPHALIC ARTERIOVENOUS  FISTULA;  Surgeon: Conrad [place], MD;  Location: [facility];  Service: Vascular;  Laterality: Left;   REVISON OF ARTERIOVENOUS FISTULA Left 01/18/2015   Procedure: REVISON OF LEFT RADIOCEPHALIC ARTERIOVENOUS FISTULA USING VASCU-GUARD PERIPHERAL VASCULAR PATCH ;  Surgeon: Mal Misty, MD;  [facility];  Service: Vascular;  Laterality: Left;   TONSILLECTOMY      Family History  Problem Relation Age of Onset   Diabetes Mother    Diabetes Father    Diabetes Brother    Hypertension Sister     Social History Reviewed with no changes to be made today.   Outpatient Medications Prior to Visit  Medication Sig Dispense Refill   Blood Glucose Monitoring Suppl (TRUE METRIX METER) w/Device KIT Use as instructed. Check blood glucose level by fingerstick twice per day. E11.65 1 kit 0   cinacalcet (SENSIPAR) 60 MG tablet Take 60 mg by mouth daily with lunch.     ferric citrate (AURYXIA) 1 GM 210 MG(Fe) tablet Take 420 mg by mouth 2 (two) times daily with a meal.     glucose blood (TRUE METRIX BLOOD GLUCOSE TEST) test strip Use as instructed. Check blood glucose level by fingerstick twice per day. E11.65 100 each 12   amLODipine (NORVASC) 10 MG tablet Take 1 tablet (10 mg total) by mouth daily. para la presi n arterial. 90 tablet 0    atorvastatin (LIPITOR) 10 MG tablet Take 1 tablet (10 mg total) by mouth daily. para el colesterol 90 tablet 0   losartan (COZAAR) 25 MG tablet Take 1 tablet (25 mg total) by mouth daily. para la presi n arterial 90 tablet 1   sitaGLIPtin (JANUVIA) 25 MG tablet Take 1 tablet (25 mg total) by mouth daily. para el az car en la sangre 90 tablet 0   calcium acetate (PHOSLO) 667 MG capsule Take 1 capsule (667 mg total) by mouth 3 (three) times daily with meals. (Patient not taking: Reported on 09/05/2021) 90 capsule 0   Insulin Pen Needle (B-D UF III MINI PEN NEEDLES) 31G X 5 MM MISC Use as instructed. Inject into the skin once daily (Patient not taking: Reported on 02/22/2022) 100  each 3   TRUEplus Lancets 28G MISC Use as instructed. Check blood glucose level by fingerstick twice per day 100 each 3   insulin aspart protamine - aspart (NOVOLOG 70/30 FLEXPEN) (70-30) 100 UNIT/ML FlexPen Inject 10 Units into the skin 2 (two) times daily with a meal. (Patient not taking: Reported on 02/22/2022) 6 mL 6   oxyCODONE (OXY IR/ROXICODONE) 5 MG immediate release tablet Take 0.5-1 tablets (2.5-5 mg total) by mouth every 6 (six) hours as needed for severe pain. (Patient not taking: Reported on 02/22/2022) 10 tablet 0   No facility-administered medications prior to visit.    Allergies  Allergen Reactions   Metformin And Related Other (See Comments)    Metformin contraindication in ESRD   Penicillins Rash       Objective:    BP (!) 170/73 (BP Location: Right Arm, Patient Position: Sitting, Cuff Size: Normal)   Pulse 82   Temp 97.7 F (36.5 C) (Oral)   Resp 16   Wt 142 lb (64.4 kg)   SpO2 96%   BMI 25.15 kg/m  Wt Readings from Last 3 Encounters:  02/22/22 142 lb (64.4 kg)  11/19/21 143 lb 3.2 oz (65 kg)  09/06/21 143 lb 8.3 oz (65.1 kg)    Physical Exam Vitals and nursing note reviewed.  Constitutional:      Appearance: He is well-developed.  HENT:     Head: Normocephalic and atraumatic.   Cardiovascular:     Rate and Rhythm: Normal rate and regular rhythm.     Heart sounds: Normal heart sounds. No murmur heard.    No friction rub. No gallop.  Pulmonary:     Effort: Pulmonary effort is normal. No tachypnea or respiratory distress.     Breath sounds: Normal breath sounds. No decreased breath sounds, wheezing, rhonchi or rales.  Chest:     Chest wall: No tenderness.  Abdominal:     General: Bowel sounds are normal.     Palpations: Abdomen is soft.  Musculoskeletal:        General: Normal range of motion.     Cervical back: Normal range of motion.  Skin:    General: Skin is warm and dry.  Neurological:     Mental Status: He is alert and oriented to person, place, and time.     Coordination: Coordination normal.  Psychiatric:        Behavior: Behavior normal. Behavior is cooperative.        Thought Content: Thought content normal.        Judgment: Judgment normal.          Patient has been counseled extensively about nutrition and exercise as well as the importance of adherence with medications and regular follow-up. The patient was given clear instructions to go to ER or return to medical center if symptoms don't improve, worsen or new problems develop. The patient verbalized understanding.   Follow-up: Return in about 3 months (around 05/25/2022) for DM .   Gildardo Pounds, FNP-BC [facility] and [place], [place]   02/22/2022, 12:27 PM

## 2022-02-22 NOTE — Telephone Encounter (Signed)
Sure that's fine [employer]

## 2022-02-22 NOTE — Addendum Note (Signed)
Addended by: Daisy Blossom, Annie Main L on: 02/22/2022 01:03 PM   Modules accepted: Orders

## 2022-02-22 NOTE — Telephone Encounter (Signed)
If Humalog MIX 75/25 kwikpen would work for patient can we have his Novolog MIX 70/30 changed so we can [place] our [place] and patient can get the meds today?  It is my understanding that the patient receives help with managing his scripts by a woman named [name] and [place] PASS took over a month to get situated and I anticipate about the same time frame for Novolog MIX patient assistance approval-after proof of income has been received.

## 2022-02-25 ENCOUNTER — Other Ambulatory Visit: Payer: Self-pay

## 2022-03-20 ENCOUNTER — Other Ambulatory Visit: Payer: Self-pay

## 2022-04-16 ENCOUNTER — Other Ambulatory Visit: Payer: Self-pay

## 2022-04-16 ENCOUNTER — Encounter (HOSPITAL_COMMUNITY): Payer: Self-pay | Admitting: *Deleted

## 2022-04-16 MED ORDER — LOKELMA 5 G PO PACK
PACK | ORAL | 2 refills | Status: DC
  Filled 2022-04-16: qty 13, 30d supply, fill #0

## 2022-04-18 ENCOUNTER — Other Ambulatory Visit: Payer: Self-pay

## 2022-04-23 ENCOUNTER — Other Ambulatory Visit: Payer: Self-pay

## 2022-05-07 ENCOUNTER — Other Ambulatory Visit: Payer: Self-pay

## 2022-05-07 ENCOUNTER — Encounter (HOSPITAL_COMMUNITY): Payer: Self-pay | Admitting: *Deleted

## 2022-05-08 ENCOUNTER — Other Ambulatory Visit: Payer: Self-pay

## 2022-05-27 ENCOUNTER — Ambulatory Visit: Payer: Self-pay | Admitting: Nurse Practitioner

## 2022-07-08 ENCOUNTER — Other Ambulatory Visit: Payer: Self-pay

## 2022-07-10 ENCOUNTER — Other Ambulatory Visit: Payer: Self-pay

## 2022-07-10 ENCOUNTER — Encounter (HOSPITAL_COMMUNITY): Payer: Self-pay | Admitting: *Deleted

## 2022-07-10 ENCOUNTER — Ambulatory Visit: Payer: Self-pay | Admitting: Nurse Practitioner

## 2022-07-31 ENCOUNTER — Encounter (HOSPITAL_COMMUNITY): Payer: Self-pay | Admitting: *Deleted

## 2022-07-31 ENCOUNTER — Ambulatory Visit: Payer: Self-pay | Attending: Nurse Practitioner | Admitting: Nurse Practitioner

## 2022-07-31 ENCOUNTER — Encounter: Payer: Self-pay | Admitting: Nurse Practitioner

## 2022-07-31 ENCOUNTER — Other Ambulatory Visit: Payer: Self-pay

## 2022-07-31 VITALS — BP 180/76 | HR 83 | Ht 63.0 in | Wt 147.2 lb

## 2022-07-31 DIAGNOSIS — Z992 Dependence on renal dialysis: Secondary | ICD-10-CM

## 2022-07-31 DIAGNOSIS — I152 Hypertension secondary to endocrine disorders: Secondary | ICD-10-CM

## 2022-07-31 DIAGNOSIS — E1122 Type 2 diabetes mellitus with diabetic chronic kidney disease: Secondary | ICD-10-CM

## 2022-07-31 DIAGNOSIS — E1159 Type 2 diabetes mellitus with other circulatory complications: Secondary | ICD-10-CM

## 2022-07-31 DIAGNOSIS — N186 End stage renal disease: Secondary | ICD-10-CM

## 2022-07-31 LAB — POCT GLYCOSYLATED HEMOGLOBIN (HGB A1C): HbA1c, POC (controlled diabetic range): 7.2 % — AB (ref 0.0–7.0)

## 2022-07-31 MED ORDER — INSULIN LISPRO PROT & LISPRO (75-25 MIX) 100 UNIT/ML KWIKPEN
12.0000 [IU] | PEN_INJECTOR | Freq: Two times a day (BID) | SUBCUTANEOUS | 2 refills | Status: DC
  Filled 2022-07-31 – 2022-08-02 (×2): qty 6, 25d supply, fill #0

## 2022-07-31 MED ORDER — SITAGLIPTIN PHOSPHATE 25 MG PO TABS
25.0000 mg | ORAL_TABLET | Freq: Every day | ORAL | 1 refills | Status: DC
  Filled 2022-07-31: qty 90, 90d supply, fill #0

## 2022-07-31 NOTE — Progress Notes (Signed)
Assessment & Plan:  Brian Fuller was seen today for diabetes and hypertension.  Diagnoses and all orders for this visit:  Type 2 diabetes mellitus with chronic kidney disease on chronic dialysis, without long-term current use of insulin (HCC) DOSE CHANGE -     POCT glycosylated hemoglobin (Hb A1C) -     sitaGLIPtin (JANUVIA) 25 MG tablet; Take 1 tablet (25 mg total) by mouth daily. para el az car en la sangre -     Insulin Lispro Prot & Lispro (HUMALOG MIX 75/25 KWIKPEN) (75-25) 100 UNIT/ML Kwikpen; Inject 12 Units into the skin in the morning and at bedtime.  Hypertension associated with type 2 diabetes mellitus ([place]) Continue all antihypertensives as prescribed.  Reminded to bring in blood pressure log for follow  up appointment.  RECOMMENDATIONS: DASH/Mediterranean Diets are healthier choices for HTN.      Patient has been counseled on age-appropriate routine health concerns for screening and prevention. These are reviewed and up-to-date. Referrals have been placed accordingly. Immunizations are up-to-date or declined.    Subjective:   Chief Complaint  Patient presents with   Diabetes   Hypertension   Diabetes Pertinent negatives for hypoglycemia include no dizziness, headaches or seizures. Pertinent negatives for diabetes include no blurred vision, no chest pain and no weight loss.  Hypertension Pertinent negatives include no blurred vision, chest pain, headaches, malaise/fatigue, palpitations or shortness of breath.   Brian Fuller 71 y.o. male presents to office today for follow up to DM and HTN He is accompanied by his neighbor NORMAl who helps to translate for him at his office visit per his request  He has a past medical history of Anemia, Chronic kidney disease, DM 2, Diabetic retinopathy, ESRD, Glaucoma, Hypertension, Hyperthyroidism, Loss of vision (08/20/2015), Pneumonia, and Systolic murmur  DM 2 He is administering 10 units of Humalog 75/25 BID. A1c not  quite at goal. Will increase to 12 units BID. Lab Results  Component Value Date   HGBA1C 7.2 (A) 07/31/2022    Lab Results  Component Value Date   HGBA1C 7.7 (A) 02/22/2022    Lab Results  Component Value Date   LDLCALC 71 09/05/2021    HTN Blood pressure is significantly elevated here in the office. He can not recall what his blood pressure readings are at HD but states he is always told they are good. He also has a BP monitor at home but has not been checking his blood pressures at home.  Currently prescribed carvedilol 3.125 mg twice daily, amlodipine 10 mg daily and losartan 25 mg daily BP Readings from Last 3 Encounters:  07/31/22 (!) 180/76  02/22/22 (!) 170/73  11/19/21 (!) 221/95    Review of Systems  Constitutional:  Negative for fever, malaise/fatigue and weight loss.  HENT: Negative.  Negative for nosebleeds.   Eyes: Negative.  Negative for blurred vision, double vision and photophobia.  Respiratory: Negative.  Negative for cough and shortness of breath.   Cardiovascular: Negative.  Negative for chest pain, palpitations and leg swelling.  Gastrointestinal: Negative.  Negative for heartburn, nausea and vomiting.  Musculoskeletal: Negative.  Negative for myalgias.  Neurological: Negative.  Negative for dizziness, focal weakness, seizures and headaches.  Psychiatric/Behavioral: Negative.  Negative for suicidal ideas.     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    Pneumonia    Systolic murmur     Past Surgical History:  Procedure Laterality Date   AMPUTATION TOE Left 09/05/2021   Procedure: AMPUTATION Left Second Toe;  Surgeon: Willaim Sheng, MD;  Location: [facility];  Service: Orthopedics;  Laterality:  Left;   AV FISTULA PLACEMENT Left 11/02/2014   Procedure: ARTERIOVENOUS (AV) FISTULA CREATION;  Surgeon: Mal Misty, MD;  Location: [facility];  Service: Vascular;  Laterality: Left;   AV FISTULA PLACEMENT Left 04/26/2015   Procedure: LEFT BRACHIOCEPHALIC ARTERIOVENOUS (AV) FISTULA CREATION;  Surgeon: Conrad [place], MD;  Location: [facility];  Service: Vascular;  Laterality: Left;   EYE SURGERY     INSERTION OF DIALYSIS CATHETER Right 11/04/2014   Procedure: INSERTION OF DIALYSIS CATHETER RIGHT INTERNAL JUGULAR VEIN;  Surgeon: Mal Misty, MD;  Location: [facility];  Service: Vascular;  Laterality: Right;   LIGATION OF ARTERIOVENOUS  FISTULA Left 04/26/2015   Procedure: LIGATION OF LEFT RADIOCEPHALIC ARTERIOVENOUS  FISTULA;  Surgeon: Conrad [place], MD;  Location: [facility];  Service: Vascular;  Laterality: Left;   REVISON OF ARTERIOVENOUS FISTULA Left 01/18/2015   Procedure: REVISON OF LEFT RADIOCEPHALIC ARTERIOVENOUS FISTULA USING VASCU-GUARD PERIPHERAL VASCULAR PATCH ;  Surgeon: Mal Misty, MD;  [facility];  Service: Vascular;  Laterality: Left;   TONSILLECTOMY      Family History  Problem Relation Age of Onset   Diabetes Mother    Diabetes Father    Diabetes Brother    Hypertension Sister     Social History Reviewed with no changes to be made today.   Outpatient Medications Prior to Visit  Medication Sig Dispense Refill   amLODipine (NORVASC) 10 MG tablet Take 1 tablet (10 mg total) by mouth daily. para la presi n arterial. 90 tablet 1   atorvastatin (LIPITOR) 10 MG tablet Take 1 tablet (10 mg total) by mouth daily. para el colesterol 90 tablet 0   Blood Glucose Monitoring Suppl (TRUE METRIX METER) w/Device KIT Use as instructed. Check blood glucose level by fingerstick twice per day. E11.65 1 kit 0   calcium acetate (PHOSLO) 667 MG capsule Take 1 capsule (667 mg total) by mouth 3 (three) times daily with meals. 90 capsule 0   carvedilol (COREG) 3.125 MG tablet Take 1 tablet (3.125 mg total) by  mouth 2 (two) times daily with a meal. PARA LA PRECION 60 tablet 3   cinacalcet (SENSIPAR) 60 MG tablet Take 60 mg by mouth daily with lunch.     ferric citrate (AURYXIA) 1 GM 210 MG(Fe) tablet Take 420 mg by mouth 2 (two) times daily with a meal.     glucose blood (TRUE METRIX BLOOD GLUCOSE TEST) test strip Use as instructed. Check blood glucose level by fingerstick twice per day. E11.65 100 each 12   Insulin Pen Needle (B-D UF III MINI PEN NEEDLES) 31G X 5 MM MISC Use as instructed. Inject into the skin once daily 100 each 3   losartan (COZAAR) 25 MG tablet Take 1 tablet (25 mg total) by mouth daily. para la presi n arterial 90 tablet 1   sodium zirconium cyclosilicate (LOKELMA) 5 g packet Take 1 packet by mouth three times a week Mix powder with 61m water and drink. Please take three times weekly 13 packet 2   TRUEplus Lancets 28G MISC Use as  instructed. Check blood glucose level by fingerstick twice per day 100 each 3   Insulin Lispro Prot & Lispro (HUMALOG MIX 75/25 KWIKPEN) (75-25) 100 UNIT/ML Kwikpen Inject 10 Units into the skin in the morning and at bedtime. 18 mL 1   Insulin Lispro Prot & Lispro (HUMALOG MIX 75/25 KWIKPEN) (75-25) 100 UNIT/ML Kwikpen Inject 10 Units into the skin in the morning and at bedtime. 6 mL 2   sitaGLIPtin (JANUVIA) 25 MG tablet Take 1 tablet (25 mg total) by mouth daily. para el az car en la sangre 90 tablet 1   No facility-administered medications prior to visit.    Allergies  Allergen Reactions   Metformin And Related Other (See Comments)    Metformin contraindication in ESRD   Penicillins Rash       Objective:    BP (!) 180/76   Pulse 83   Ht _0  (1.6 m)   Wt 147 lb 3.2 oz (66.8 kg)   SpO2 98%   BMI 26.08 kg/m  Wt Readings from Last 3 Encounters:  07/31/22 147 lb 3.2 oz (66.8 kg)  02/22/22 142 lb (64.4 kg)  11/19/21 143 lb 3.2 oz (65 kg)    Physical Exam Vitals and nursing note reviewed.  Constitutional:      Appearance: He is  well-developed.  HENT:     Head: Normocephalic and atraumatic.  Cardiovascular:     Rate and Rhythm: Normal rate and regular rhythm.     Heart sounds: Normal heart sounds. No murmur heard.    No friction rub. No gallop.  Pulmonary:     Effort: Pulmonary effort is normal. No tachypnea or respiratory distress.     Breath sounds: Normal breath sounds. No decreased breath sounds, wheezing, rhonchi or rales.  Chest:     Chest wall: No tenderness.  Abdominal:     General: Bowel sounds are normal.     Palpations: Abdomen is soft.  Musculoskeletal:        General: Normal range of motion.       Arms:     Cervical back: Normal range of motion.  Skin:    General: Skin is warm and dry.  Neurological:     Mental Status: He is alert and oriented to person, place, and time.     Coordination: Coordination normal.  Psychiatric:        Behavior: Behavior normal. Behavior is cooperative.        Thought Content: Thought content normal.        Judgment: Judgment normal.          Patient has been counseled extensively about nutrition and exercise as well as the importance of adherence with medications and regular follow-up. The patient was given clear instructions to go to ER or return to medical center if symptoms don't improve, worsen or new problems develop. The patient verbalized understanding.   Follow-up: Return in 3 months (on 10/30/2022) for DM HTN.   Gildardo Pounds, FNP-BC [facility] and [place], [place]   07/31/2022, 12:25 PM

## 2022-08-02 ENCOUNTER — Other Ambulatory Visit: Payer: Self-pay

## 2022-08-02 ENCOUNTER — Encounter (HOSPITAL_COMMUNITY): Payer: Self-pay | Admitting: *Deleted

## 2022-08-08 ENCOUNTER — Other Ambulatory Visit: Payer: Self-pay

## 2022-09-13 ENCOUNTER — Other Ambulatory Visit: Payer: Self-pay | Admitting: Nurse Practitioner

## 2022-09-13 ENCOUNTER — Encounter (HOSPITAL_COMMUNITY): Payer: Self-pay | Admitting: *Deleted

## 2022-09-13 ENCOUNTER — Other Ambulatory Visit: Payer: Self-pay

## 2022-09-13 DIAGNOSIS — E1122 Type 2 diabetes mellitus with diabetic chronic kidney disease: Secondary | ICD-10-CM

## 2022-09-13 MED ORDER — BD PEN NEEDLE MINI U/F 31G X 5 MM MISC
3 refills | Status: AC
  Filled 2022-09-13: qty 100, 50d supply, fill #0
  Filled 2023-04-30: qty 100, 50d supply, fill #1
  Filled 2023-06-23: qty 100, 50d supply, fill #2
  Filled 2023-08-05: qty 100, 50d supply, fill #3

## 2022-09-13 MED ORDER — INSULIN LISPRO PROT & LISPRO (75-25 MIX) 100 UNIT/ML KWIKPEN
12.0000 [IU] | PEN_INJECTOR | Freq: Two times a day (BID) | SUBCUTANEOUS | 2 refills | Status: DC
  Filled 2022-09-13: qty 6, 25d supply, fill #0
  Filled 2022-10-30: qty 6, 25d supply, fill #1

## 2022-09-13 NOTE — Telephone Encounter (Signed)
Medication Refill - Medication:  Insulin Lispro Prot & Lispro (HUMALOG MIX 75/25 KWIKPEN) (75-25) 100 UNIT/ML Kwikpen   Insulin Pen Needle (B-D UF III MINI PEN NEEDLES) 31G X 5 MM MISC   Has the patient contacted their pharmacy? No. (Agent: If no, request that the patient contact the pharmacy for the refill. If patient does not wish to contact the pharmacy document the reason why and proceed with request.) (Agent: If yes, when and what did the pharmacy advise?)  Preferred Pharmacy (with phone number or street name): [facility] [address] [place], [place] [address] Phone: [phone]  Fax: [phone]  Has the patient been seen for an appointment in the last year OR does the patient have an upcoming appointment? Yes.    Agent: Please be advised that RX refills may take up to 3 business days. We ask that you follow-up with your pharmacy.

## 2022-09-13 NOTE — Telephone Encounter (Signed)
Requested Prescriptions  Pending Prescriptions Disp Refills   Insulin Lispro Prot & Lispro (HUMALOG MIX 75/25 KWIKPEN) (75-25) 100 UNIT/ML Kwikpen 6 mL 2    Sig: Inject 12 Units into the skin in the morning and at bedtime.     Endocrinology:  Diabetes - Insulins Passed - 09/13/2022  1:02 PM      Passed - HBA1C is between 0 and 7.9 and within 180 days    HbA1c, POC (controlled diabetic range)  Date Value Ref Range Status  07/31/2022 7.2 (A) 0.0 - 7.0 % Final         Passed - Valid encounter within last 6 months    Recent Outpatient Visits           1 month ago Type 2 diabetes mellitus with chronic kidney disease on chronic dialysis, without long-term current use of insulin [facility])   [place], [place] W, NP   6 months ago Type 2 diabetes mellitus with chronic kidney disease on chronic dialysis, without long-term current use of insulin [facility])   [place], [place] W, NP   8 months ago Hypertension associated with type 2 diabetes mellitus [facility])   [place], Vernia Buff, NP   9 months ago Encounter to establish care   [place], [place] W, NP   6 years ago Uncontrolled type 2 diabetes mellitus with complication, without long-term current use of insulin [facility])   [place] Nearing, MD       Future Appointments             In 1 month Gildardo Pounds, NP [place]             Insulin Pen Needle (B-D UF III MINI PEN NEEDLES) 31G X 5 MM MISC 100 each 3    Sig: Use as instructed. Inject into the skin once daily     Endocrinology: Diabetes - Testing Supplies Passed - 09/13/2022  1:02 PM      Passed - Valid encounter within last 12 months    Recent Outpatient Visits           1 month ago Type 2 diabetes mellitus with chronic kidney  disease on chronic dialysis, without long-term current use of insulin [facility])   [place], [place] W, NP   6 months ago Type 2 diabetes mellitus with chronic kidney disease on chronic dialysis, without long-term current use of insulin [facility])   [place], [place] W, NP   8 months ago Hypertension associated with type 2 diabetes mellitus [facility])   [place], Vernia Buff, NP   9 months ago Encounter to establish care   [facility] [place], [place] W, NP   6 years ago Uncontrolled type 2 diabetes mellitus with complication, without long-term current use of insulin [facility])   [place] Nearing, MD       Future Appointments             In 1 month Gildardo Pounds, NP [place]

## 2022-09-16 ENCOUNTER — Other Ambulatory Visit: Payer: Self-pay

## 2022-09-16 ENCOUNTER — Ambulatory Visit: Payer: Self-pay | Admitting: *Deleted

## 2022-09-16 ENCOUNTER — Encounter (HOSPITAL_COMMUNITY): Payer: Self-pay | Admitting: *Deleted

## 2022-09-16 NOTE — Telephone Encounter (Addendum)
Nausea/ weakness   Per agent: "The niece of the patient called in stating the patient has told her several times where he feels like he may pass out. He has been sweating a lot and he gets bad nausea. This has been since he last saw his provider last month. He has been on his insulin for several months and never experienced this at the beginning but states it now happens directly after he takes it. Not sure if it is related or not. Please assist patient further and you will need a translator as they do not speak english at all."      Chief Complaint: Med Question Symptoms: Fatigue, nausea, "Shaky in AM" Frequency: "Since increasing insulin dosage" Pertinent Negatives: Patient denies  Disposition: [] ED /[] Urgent Care (no appt availability in office) / [x] Appointment(In office/virtual)/ []  [place] [facility]/ [] Home Care/ [] Refused Recommended Disposition /[] [place] [facility]/ [x]  Follow-up with PCP Additional Note: Secured Appt for tomorrow. Advised ED for worsening symptoms.  Pt states had dialysis last Tues. States felt like he was going to pass out, states then slept though dialysis. States MD there told him may be from the Insulin. States "Shaky in mornings, nausea at times. No further episodes of felling like passing out. Pt does not check BS at home. States in HD blood sugar runs 136,141,126.  Assisted by Margarette Canada [phone]  Able to secure appt for tomorrow afternoon.  Reason for Disposition  [1] Caller has URGENT medicine question about med that PCP or specialist prescribed AND [2] triager unable to answer question  Answer Assessment - Initial Assessment Questions 1. NAME of MEDICINE: "What medicine(s) are you calling about?"     Insulin 2. QUESTIN: "What is your question?" (e.g., double dose of medicine, side effect)      3. PRESCRIBER: "Who prescribed the medicine?" Reason: if prescribed by specialist, call should be referred to that group.     PCP 4. SYMPTOMS:  "Do you have any symptoms?" If Yes, ask: "What symptoms are you having?"  "How bad are the symptoms (e.g., mild, moderate, severe)     Sleepy, nausea, shaky in AM  Protocols used: Medication Question Call-A-AH

## 2022-09-16 NOTE — Telephone Encounter (Signed)
Call placed to patient with the used of the language line. # [id number] message left to return call so that s/s can be discussed for further advise if needed and also remind patient that he has an appointment on 09/17/2022 with Z. Raul Del ,NP on 09/17/2022. Unable to reach message left on VM.

## 2022-09-17 ENCOUNTER — Ambulatory Visit: Payer: Self-pay | Attending: Nurse Practitioner | Admitting: Nurse Practitioner

## 2022-09-17 ENCOUNTER — Other Ambulatory Visit: Payer: Self-pay

## 2022-09-17 ENCOUNTER — Encounter (HOSPITAL_COMMUNITY): Payer: Self-pay | Admitting: *Deleted

## 2022-09-17 ENCOUNTER — Encounter: Payer: Self-pay | Admitting: Nurse Practitioner

## 2022-09-17 VITALS — BP 170/77 | HR 89 | Ht 63.0 in | Wt 146.6 lb

## 2022-09-17 DIAGNOSIS — E785 Hyperlipidemia, unspecified: Secondary | ICD-10-CM

## 2022-09-17 DIAGNOSIS — Z992 Dependence on renal dialysis: Secondary | ICD-10-CM

## 2022-09-17 DIAGNOSIS — E1122 Type 2 diabetes mellitus with diabetic chronic kidney disease: Secondary | ICD-10-CM

## 2022-09-17 DIAGNOSIS — E1159 Type 2 diabetes mellitus with other circulatory complications: Secondary | ICD-10-CM

## 2022-09-17 DIAGNOSIS — N186 End stage renal disease: Secondary | ICD-10-CM

## 2022-09-17 DIAGNOSIS — E1169 Type 2 diabetes mellitus with other specified complication: Secondary | ICD-10-CM

## 2022-09-17 DIAGNOSIS — I152 Hypertension secondary to endocrine disorders: Secondary | ICD-10-CM

## 2022-09-17 LAB — GLUCOSE, POCT (MANUAL RESULT ENTRY): POC Glucose: 244 mg/dl — AB (ref 70–99)

## 2022-09-17 MED ORDER — CARVEDILOL 6.25 MG PO TABS
6.2500 mg | ORAL_TABLET | Freq: Two times a day (BID) | ORAL | 1 refills | Status: DC
  Filled 2022-09-17: qty 180, 90d supply, fill #0

## 2022-09-17 MED ORDER — ATORVASTATIN CALCIUM 10 MG PO TABS
10.0000 mg | ORAL_TABLET | Freq: Every day | ORAL | 0 refills | Status: DC
  Filled 2022-09-17: qty 90, 90d supply, fill #0

## 2022-09-17 NOTE — Progress Notes (Signed)
Assessment & Plan:  Brian Fuller was seen today for hypoglycemia.  Diagnoses and all orders for this visit:  Type 2 diabetes mellitus with chronic kidney disease on chronic dialysis, without long-term current use of insulin (HCC) -     POCT glucose (manual entry)  Hypertension associated with type 2 diabetes mellitus (HCC) -     carvedilol (COREG) 6.25 MG tablet; Take 1 tablet (6.25 mg total) by mouth 2 (two) times daily with a meal. PARA LA PRECION  Hyperlipidemia associated with type 2 diabetes mellitus (HCC) -     atorvastatin (LIPITOR) 10 MG tablet; Take 1 tablet (10 mg total) by mouth daily. para el colesterol    Patient has been counseled on age-appropriate routine health concerns for screening and prevention. These are reviewed and up-to-date. Referrals have been placed accordingly. Immunizations are up-to-date or declined.    Subjective:   Chief Complaint  Patient presents with   Hypoglycemia    Concerns of low glucose levels.    [name] 72 y.o. male presents to office today with concerns of hypoglycemia however glucose is 244 today.  He is accompanied by his neighbor Brian Fuller who helps to translate for him at his office visit per his request   He has a past medical history of Anemia, Chronic kidney disease, DM 2, Diabetic retinopathy, ESRD, Glaucoma, Hypertension, Hyperthyroidism, Loss of vision (08/20/2015), Pneumonia, and Systolic murmur  I have explained to Brian Fuller that it would not be recommended to decrease any of his diabetic medications at this time based on his blood glucose reading today which is greater than 200. He does not have a log of his blood glucose levels.  Lab Results  Component Value Date   HGBA1C 7.2 (A) 07/31/2022    Blood pressure is elevated today. Will start carvedilol 6.25 mg BID. He will continue amlodipine 10 mg daily and losartan 25 mg daily.  BP Readings from Last 3 Encounters:  09/17/22 (!) 170/77  07/31/22 (!) 180/76   02/22/22 (!) 170/73   LDL at goal with atorvastatin 10 mg daily. Lab Results  Component Value Date   LDLCALC 71 09/05/2021     Review of Systems  Constitutional:  Negative for fever, malaise/fatigue and weight loss.  HENT: Negative.  Negative for nosebleeds.   Eyes: Negative.  Negative for blurred vision, double vision and photophobia.  Respiratory: Negative.  Negative for cough and shortness of breath.   Cardiovascular: Negative.  Negative for chest pain, palpitations and leg swelling.  Gastrointestinal: Negative.  Negative for heartburn, nausea and vomiting.  Musculoskeletal: Negative.  Negative for myalgias.  Neurological: Negative.  Negative for dizziness, focal weakness, seizures and headaches.  Psychiatric/Behavioral: Negative.  Negative for suicidal ideas.     Past Medical History:  Diagnosis Date   Anemia    Chronic kidney disease    dialysis, T/Th/Sat   Diabetes mellitus without complication ([place])    Diabetic retinopathy ([place])    ESRD (end stage renal disease) ([place])    Glaucoma    Hypertension    Hyperthyroidism    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    Pneumonia    Systolic murmur     Past Surgical History:  Procedure Laterality Date   AMPUTATION TOE Left 09/05/2021   Procedure: AMPUTATION Left Second Toe;  Surgeon: Willaim Sheng, MD;  Location: [place];  Service: Orthopedics;  Laterality: Left;   AV FISTULA PLACEMENT Left 11/02/2014   Procedure: ARTERIOVENOUS (AV) FISTULA CREATION;  Surgeon: Jeneen Rinks  [place], MD;  Location: [place];  Service: Vascular;  Laterality: Left;   AV FISTULA PLACEMENT Left 04/26/2015   Procedure: LEFT BRACHIOCEPHALIC ARTERIOVENOUS (AV) FISTULA CREATION;  Surgeon: Conrad [place], MD;  Location: [place];  Service: Vascular;  Laterality: Left;   EYE SURGERY     INSERTION OF DIALYSIS CATHETER Right 11/04/2014   Procedure: INSERTION OF DIALYSIS CATHETER RIGHT INTERNAL JUGULAR VEIN;  Surgeon: Mal Misty, MD;  Location: [place];  Service:  Vascular;  Laterality: Right;   LIGATION OF ARTERIOVENOUS  FISTULA Left 04/26/2015   Procedure: LIGATION OF LEFT RADIOCEPHALIC ARTERIOVENOUS  FISTULA;  Surgeon: Conrad , MD;  Location: [place];  Service: Vascular;  Laterality: Left;   REVISON OF ARTERIOVENOUS FISTULA Left 01/18/2015   Procedure: REVISON OF LEFT RADIOCEPHALIC ARTERIOVENOUS FISTULA USING VASCU-GUARD PERIPHERAL VASCULAR PATCH ;  Surgeon: Mal Misty, MD;  [facility];  Service: Vascular;  Laterality: Left;   TONSILLECTOMY      Family History  Problem Relation Age of Onset   Diabetes Mother    Diabetes Father    Diabetes Brother    Hypertension Sister     Social History Reviewed with no changes to be made today.   Outpatient Medications Prior to Visit  Medication Sig Dispense Refill   amLODipine (NORVASC) 10 MG tablet Take 1 tablet (10 mg total) by mouth daily. para la presi n arterial. 90 tablet 1   calcium acetate (PHOSLO) 667 MG capsule Take 1 capsule (667 mg total) by mouth 3 (three) times daily with meals. 90 capsule 0   cinacalcet (SENSIPAR) 60 MG tablet Take 60 mg by mouth daily with lunch.     ferric citrate (AURYXIA) 1 GM 210 MG(Fe) tablet Take 420 mg by mouth 2 (two) times daily with a meal.     Insulin Lispro Prot & Lispro (HUMALOG MIX 75/25 KWIKPEN) (75-25) 100 UNIT/ML Kwikpen Inject 12 Units into the skin in the morning and at bedtime. 6 mL 2   Insulin Pen Needle (B-D UF III MINI PEN NEEDLES) 31G X 5 MM MISC Use as instructed. Inject into the skin once daily 100 each 3   losartan (COZAAR) 25 MG tablet Take 1 tablet (25 mg total) by mouth daily. para la presi n arterial 90 tablet 1   sitaGLIPtin (JANUVIA) 25 MG tablet Take 1 tablet (25 mg total) by mouth daily. para el az car en la sangre 90 tablet 1   sodium zirconium cyclosilicate (LOKELMA) 5 g packet Take 1 packet by mouth three times a week Mix powder with 70ml water and drink. Please take three times weekly 13 packet 2   carvedilol (COREG) 3.125 MG  tablet Take 1 tablet (3.125 mg total) by mouth 2 (two) times daily with a meal. PARA LA PRECION 60 tablet 3   Blood Glucose Monitoring Suppl (TRUE METRIX METER) w/Device KIT Use as instructed. Check blood glucose level by fingerstick twice per day. (Patient not taking: Reported on 09/17/2022) 1 kit 0   glucose blood (TRUE METRIX BLOOD GLUCOSE TEST) test strip Use as instructed. Check blood glucose level by fingerstick twice per day. (Patient not taking: Reported on 09/17/2022) 100 each 12   TRUEplus Lancets 28G MISC Use as instructed. Check blood glucose level by fingerstick twice per day (Patient not taking: Reported on 09/17/2022) 100 each 3   atorvastatin (LIPITOR) 10 MG tablet Take 1 tablet (10 mg total) by mouth daily. para el colesterol (Patient not taking: Reported on 09/17/2022) 90 tablet  0   No facility-administered medications prior to visit.    Allergies  Allergen Reactions   Metformin And Related Other (See Comments)    Metformin contraindication in ESRD   Penicillins Rash       Objective:    BP (!) 170/77   Pulse 89   Ht 5\' 3"  (1.6 m)   Wt 146 lb 9.6 oz (66.5 kg)   SpO2 95%   BMI 25.97 kg/m  Wt Readings from Last 3 Encounters:  09/17/22 146 lb 9.6 oz (66.5 kg)  07/31/22 147 lb 3.2 oz (66.8 kg)  02/22/22 142 lb (64.4 kg)    Physical Exam Vitals and nursing note reviewed.  Constitutional:      Appearance: He is well-developed.  HENT:     Head: Normocephalic and atraumatic.  Cardiovascular:     Rate and Rhythm: Brian Fuller rate and regular rhythm.     Heart sounds: Brian Fuller heart sounds. No murmur heard.    No friction rub. No gallop.  Pulmonary:     Effort: Pulmonary effort is Brian Fuller. No tachypnea or respiratory distress.     Breath sounds: Brian Fuller breath sounds. No decreased breath sounds, wheezing, rhonchi or rales.  Chest:     Chest wall: No tenderness.  Abdominal:     General: Bowel sounds are Brian Fuller.     Palpations: Abdomen is soft.  Musculoskeletal:         General: Brian Fuller range of motion.     Cervical back: Brian Fuller range of motion.  Skin:    General: Skin is warm and dry.  Neurological:     Mental Status: He is alert and oriented to person, place, and time.     Coordination: Coordination Brian Fuller.  Psychiatric:        Behavior: Behavior Brian Fuller. Behavior is cooperative.        Thought Content: Thought content Brian Fuller.        Judgment: Judgment Brian Fuller.          Patient has been counseled extensively about nutrition and exercise as well as the importance of adherence with medications and regular follow-up. The patient was given clear instructions to go to ER or return to medical center if symptoms don't improve, worsen or new problems develop. The patient verbalized understanding.   Follow-up: Return for already has appt scheduled.   Gildardo Pounds, FNP-BC [facility], [place]   09/25/2022, 9:24 PM

## 2022-09-17 NOTE — Progress Notes (Signed)
Pates states that he felt Fatigue and dizzy last Tuesday, levels checked at dialysis 116.

## 2022-09-25 ENCOUNTER — Encounter: Payer: Self-pay | Admitting: Nurse Practitioner

## 2022-10-15 ENCOUNTER — Ambulatory Visit (INDEPENDENT_AMBULATORY_CARE_PROVIDER_SITE_OTHER): Payer: Self-pay | Admitting: Physician Assistant

## 2022-10-15 ENCOUNTER — Encounter: Payer: Self-pay | Admitting: Physician Assistant

## 2022-10-15 ENCOUNTER — Other Ambulatory Visit: Payer: Self-pay

## 2022-10-15 VITALS — BP 161/79 | HR 75 | Temp 98.0°F | Resp 20 | Ht 63.0 in | Wt 145.3 lb

## 2022-10-15 DIAGNOSIS — Z992 Dependence on renal dialysis: Secondary | ICD-10-CM

## 2022-10-15 DIAGNOSIS — N186 End stage renal disease: Secondary | ICD-10-CM

## 2022-10-15 MED ORDER — SODIUM CHLORIDE 0.9% FLUSH: 3.0000 mL | Freq: Two times a day (BID) | INTRAVENOUS | Status: AC

## 2022-10-15 MED ORDER — SODIUM CHLORIDE 0.9 % IV SOLN: 250.0000 mL | INTRAVENOUS | Status: AC | PRN

## 2022-10-15 NOTE — Progress Notes (Signed)
HISTORY AND PHYSICAL     CC:  dialysis access Requesting Provider:  Gildardo Pounds, NP  HPI: This is a 72 y.o. male here for evaluation of hemodialysis access.  Pt has hx of ESRD.  Pt had left BC AVF creation 04/26/2015 by Dr. Kellie Simmering.    He is here today with his friend [name] who is translating.  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  He states that he does not have any prolonged bleeding unless they do not put the bandages on correctly after HD.  They really have not had any issues with dialysis with the machines alarming.   He does not have any ulcerations on the fistula.  He states they stick it on the sides of the fistula.  T  Pt is on dialysis.   Days of dialysis if applicable:  T/T/S    HD center:  [place] location.  He has hx of DM.  He is not on blood thinners.  The pt is on a statin for cholesterol management.  The pt is not on a daily aspirin.  Other AC:  none The pt is on BB, ARB for hypertension.  The pt does have diabetes.   Tobacco hx:  former  Past Medical History:  Diagnosis Date   Anemia    Chronic kidney disease    dialysis, T/Th/Sat   Diabetes mellitus without complication ([place])    Diabetic retinopathy ([place])    ESRD (end stage renal disease) ([place])    Glaucoma    Hypertension    Hyperthyroidism    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    Pneumonia    Systolic murmur     Past Surgical History:  Procedure Laterality Date   AMPUTATION TOE Left 09/05/2021   Procedure: AMPUTATION Left Second Toe;  Surgeon: Willaim Sheng, MD;  Location: [place];  Service: Orthopedics;  Laterality: Left;   AV FISTULA PLACEMENT Left 11/02/2014   Procedure: ARTERIOVENOUS (AV) FISTULA CREATION;  Surgeon: Mal Misty, MD;  Location: [place];  Service: Vascular;  Laterality: Left;   AV FISTULA PLACEMENT Left 04/26/2015   Procedure: LEFT BRACHIOCEPHALIC ARTERIOVENOUS (AV) FISTULA CREATION;  Surgeon: Conrad [place]al[place], MD;  Location: [place];   Service: Vascular;  Laterality: Left;   EYE SURGERY     INSERTION OF DIALYSIS CATHETER Right 11/04/2014   Procedure: INSERTION OF DIALYSIS CATHETER RIGHT INTERNAL JUGULAR VEIN;  Surgeon: Mal Misty, MD;  Location: [place];  Service: Vascular;  Laterality: Right;   LIGATION OF ARTERIOVENOUS  FISTULA Left 04/26/2015   Procedure: LIGATION OF LEFT RADIOCEPHALIC ARTERIOVENOUS  FISTULA;  Surgeon: Conrad [place], MD;  Location: [place];  Service: Vascular;  Laterality: Left;   REVISON OF ARTERIOVENOUS FISTULA Left 01/18/2015   Procedure: REVISON OF LEFT RADIOCEPHALIC ARTERIOVENOUS FISTULA USING VASCU-GUARD PERIPHERAL VASCULAR PATCH ;  Surgeon: Mal Misty, MD;  [facility];  Service: Vascular;  Laterality: Left;   TONSILLECTOMY      Allergies  Allergen Reactions   Metformin And Related Other (See Comments)    Metformin contraindication in ESRD   Penicillins Rash    Current Outpatient Medications  Medication Sig Dispense Refill   amLODipine (NORVASC) 10 MG tablet Take 1 tablet (10 mg total) by mouth daily. para la presi n arterial. 90 tablet 1   atorvastatin (LIPITOR) 10 MG tablet Take 1 tablet (10 mg total) by mouth daily. para el colesterol 90 tablet 0  Blood Glucose Monitoring Suppl (TRUE METRIX METER) w/Device KIT Use as instructed. Check blood glucose level by fingerstick twice per day. (Patient not taking: Reported on 09/17/2022) 1 kit 0   calcium acetate (PHOSLO) 667 MG capsule Take 1 capsule (667 mg total) by mouth 3 (three) times daily with meals. 90 capsule 0   carvedilol (COREG) 6.25 MG tablet Take 1 tablet (6.25 mg total) by mouth 2 (two) times daily with a meal. PARA LA PRECION 180 tablet 1   cinacalcet (SENSIPAR) 60 MG tablet Take 60 mg by mouth daily with lunch.     ferric citrate (AURYXIA) 1 GM 210 MG(Fe) tablet Take 420 mg by mouth 2 (two) times daily with a meal.     glucose blood (TRUE METRIX BLOOD GLUCOSE TEST) test strip Use as instructed. Check blood glucose level by  fingerstick twice per day. (Patient not taking: Reported on 09/17/2022) 100 each 12   Insulin Lispro Prot & Lispro (HUMALOG MIX 75/25 KWIKPEN) (75-25) 100 UNIT/ML Kwikpen Inject 12 Units into the skin in the morning and at bedtime. 6 mL 2   Insulin Pen Needle (B-D UF III MINI PEN NEEDLES) 31G X 5 MM MISC Use as instructed. Inject into the skin once daily 100 each 3   losartan (COZAAR) 25 MG tablet Take 1 tablet (25 mg total) by mouth daily. para la presi n arterial 90 tablet 1   sitaGLIPtin (JANUVIA) 25 MG tablet Take 1 tablet (25 mg total) by mouth daily. para el az car en la sangre 90 tablet 1   sodium zirconium cyclosilicate (LOKELMA) 5 g packet Take 1 packet by mouth three times a week Mix powder with 83m water and drink. Please take three times weekly 13 packet 2   TRUEplus Lancets 28G MISC Use as instructed. Check blood glucose level by fingerstick twice per day (Patient not taking: Reported on 09/17/2022) 100 each 3   No current facility-administered medications for this visit.    Family History  Problem Relation Age of Onset   Diabetes Mother    Diabetes Father    Diabetes Brother    Hypertension Sister     Social History   Socioeconomic History   Marital status: Married    Spouse name: Not on file   Number of children: Not on file   Years of education: Not on file   Highest education level: Not on file  Occupational History   Not on file  Tobacco Use   Smoking status: Former    Types: Cigarettes    Quit date: 01/17/1984    Years since quitting: 38.7   Smokeless tobacco: Never  Vaping Use   Vaping Use: Never used  Substance and Sexual Activity   Alcohol use: No    Alcohol/week: 0.0 standard drinks of alcohol   Drug use: No   Sexual activity: Not on file  Other Topics Concern   Not on file  Social History Narrative   Not on file   Social Determinants of Health   Financial Resource Strain: Not on file  Food Insecurity: Not on file  Transportation Needs: Not on  file  Physical Activity: Not on file  Stress: Not on file  Social Connections: Not on file  Intimate Partner Violence: Not on file     ROS: '[x]'$  Positive   '[ ]'$  Negative   '[ ]'$  All sytems reviewed and are negative  Cardiac: '[]'$  chest pain/pressure '[]'$  SOB/DOE  Vascular: '[]'$  pain in legs while walking '[]'$  pain in feet  when lying flat '[]'$  swelling in legs  Pulmonary: '[]'$  asthma '[]'$  wheezing  Neurologic: '[]'$  hx CVA/TIA  Hematologic: '[]'$  bleeding problems  GI '[]'$  GERD  GU: '[x]'$  CKD/renal failure  '[x]'$  HD---'[]'$  M/W/F '[x]'$  T/T/S  Psychiatric: '[]'$  hx of major depression  Integumentary: '[]'$  rashes '[]'$  ulcers  Constitutional: '[]'$  fever '[]'$  chills   PHYSICAL EXAMINATION:  Today's Vitals   10/15/22 1507  BP: (!) 161/79  Pulse: 75  Resp: 20  Temp: 98 F (36.7 C)  TempSrc: Temporal  SpO2: 97%  Weight: 145 lb 4.8 oz (65.9 kg)  Height: '5\' 3"'$  (1.6 m)   Body mass index is 25.74 kg/m.    General:  WDWN male in NAD Gait: Not observed HENT: WNL Pulmonary: normal non-labored breathing  Cardiac: regular Skin: without rashes Vascular Exam/Pulses:   Right Left  Radial 2+ (normal) 2+ (normal)   Extremities:  fistula has a good thrill distal and proximal to the aneurysmal areas.  There is some hypopigmentation of the skin over the aneurysmal area but the skin is loose over the fistula and no ulcerations present.    Musculoskeletal: no muscle wasting or atrophy  Neurologic: A&O X 3    ASSESSMENT/PLAN: 72 y.o. male with ESRD here for evaluation of his hemodialysis access with hx of left BC AVF created in 2016 by Dr. Kellie Simmering.    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  Prior to consideration of plication of fistula, he will need fistulogram to evaluate.  Discussed with [name] and pt that if there is an area of narrowing, we may be able to intervene and would do this prior to any plication.  Discussed that if the fistula  needs plication, he may require a dialysis catheter if there is not enough room to stick the fistula.  Further discussions about this after fistulogram and findings at that time. -we will plan this for a non dialysis day.     Leontine Locket, [facility] [phone]  Clinic MD:   Carlis Abbott

## 2022-10-16 ENCOUNTER — Other Ambulatory Visit: Payer: Self-pay

## 2022-10-16 ENCOUNTER — Ambulatory Visit (HOSPITAL_COMMUNITY)
Admission: RE | Disposition: A | Discharge status: Home / Self Care | Payer: Self-pay | Source: Ambulatory Visit | Attending: Vascular Surgery

## 2022-10-16 ENCOUNTER — Encounter (HOSPITAL_COMMUNITY): Payer: Self-pay | Admitting: Vascular Surgery

## 2022-10-16 ENCOUNTER — Ambulatory Visit (HOSPITAL_COMMUNITY)
Admission: RE | Admit: 2022-10-16 | Discharge: 2022-10-16 | Disposition: A | Discharge status: Home / Self Care | Payer: Self-pay | Source: Ambulatory Visit | Attending: Vascular Surgery | Admitting: Vascular Surgery

## 2022-10-16 DIAGNOSIS — Y841 Kidney dialysis as the cause of abnormal reaction of the patient, or of later complication, without mention of misadventure at the time of the procedure: Secondary | ICD-10-CM | POA: Insufficient documentation

## 2022-10-16 DIAGNOSIS — I120 Hypertensive chronic kidney disease with stage 5 chronic kidney disease or end stage renal disease: Secondary | ICD-10-CM | POA: Insufficient documentation

## 2022-10-16 DIAGNOSIS — Z87891 Personal history of nicotine dependence: Secondary | ICD-10-CM | POA: Insufficient documentation

## 2022-10-16 DIAGNOSIS — N185 Chronic kidney disease, stage 5: Secondary | ICD-10-CM

## 2022-10-16 DIAGNOSIS — Z992 Dependence on renal dialysis: Secondary | ICD-10-CM | POA: Insufficient documentation

## 2022-10-16 DIAGNOSIS — T82898A Other specified complication of vascular prosthetic devices, implants and grafts, initial encounter: Secondary | ICD-10-CM

## 2022-10-16 DIAGNOSIS — Z79899 Other long term (current) drug therapy: Secondary | ICD-10-CM | POA: Insufficient documentation

## 2022-10-16 DIAGNOSIS — N186 End stage renal disease: Secondary | ICD-10-CM | POA: Insufficient documentation

## 2022-10-16 DIAGNOSIS — E1122 Type 2 diabetes mellitus with diabetic chronic kidney disease: Secondary | ICD-10-CM | POA: Insufficient documentation

## 2022-10-16 DIAGNOSIS — T82858A Stenosis of vascular prosthetic devices, implants and grafts, initial encounter: Secondary | ICD-10-CM | POA: Insufficient documentation

## 2022-10-16 HISTORY — PX: PERIPHERAL VASCULAR INTERVENTION: CATH118257

## 2022-10-16 HISTORY — PX: A/V FISTULAGRAM: CATH118298

## 2022-10-16 LAB — POCT I-STAT, CHEM 8
BUN: 36 mg/dL — ABNORMAL HIGH (ref 8–23)
Calcium, Ion: 1.13 mmol/L — ABNORMAL LOW (ref 1.15–1.40)
Chloride: 96 mmol/L — ABNORMAL LOW (ref 98–111)
Creatinine, Ser: 7.7 mg/dL — ABNORMAL HIGH (ref 0.61–1.24)
Glucose, Bld: 124 mg/dL — ABNORMAL HIGH (ref 70–99)
HCT: 36 % — ABNORMAL LOW (ref 39.0–52.0)
Hemoglobin: 12.2 g/dL — ABNORMAL LOW (ref 13.0–17.0)
Potassium: 5.2 mmol/L — ABNORMAL HIGH (ref 3.5–5.1)
Sodium: 134 mmol/L — ABNORMAL LOW (ref 135–145)
TCO2: 30 mmol/L (ref 22–32)

## 2022-10-16 LAB — GLUCOSE, CAPILLARY: Glucose-Capillary: 111 mg/dL — ABNORMAL HIGH (ref 70–99)

## 2022-10-16 SURGERY — A/V FISTULAGRAM
Anesthesia: LOCAL | Laterality: Left

## 2022-10-16 MED ORDER — LIDOCAINE HCL (PF) 1 % IJ SOLN
INTRAMUSCULAR | Status: AC
  Filled 2022-10-16: qty 30

## 2022-10-16 MED ORDER — SODIUM CHLORIDE 0.9% FLUSH: 3.0000 mL | INTRAVENOUS | Status: DC | PRN

## 2022-10-16 MED ORDER — FENTANYL CITRATE (PF) 100 MCG/2ML IJ SOLN
INTRAMUSCULAR | Status: DC | PRN
  Administered 2022-10-16: 25 ug via INTRAVENOUS

## 2022-10-16 MED ORDER — FENTANYL CITRATE (PF) 100 MCG/2ML IJ SOLN
INTRAMUSCULAR | Status: AC
  Filled 2022-10-16: qty 2

## 2022-10-16 MED ORDER — HEPARIN SODIUM (PORCINE) 1000 UNIT/ML IJ SOLN
INTRAMUSCULAR | Status: DC | PRN
  Administered 2022-10-16: 2000 [IU] via INTRAVENOUS

## 2022-10-16 MED ORDER — HEPARIN SODIUM (PORCINE) 1000 UNIT/ML IJ SOLN
INTRAMUSCULAR | Status: AC
  Filled 2022-10-16: qty 10

## 2022-10-16 MED ORDER — LIDOCAINE HCL (PF) 1 % IJ SOLN
INTRAMUSCULAR | Status: DC | PRN
  Administered 2022-10-16: 5 mL

## 2022-10-16 MED ORDER — IODIXANOL 320 MG/ML IV SOLN
INTRAVENOUS | Status: DC | PRN
  Administered 2022-10-16: 60 mL

## 2022-10-16 MED ORDER — HEPARIN (PORCINE) IN NACL 1000-0.9 UT/500ML-% IV SOLN
INTRAVENOUS | Status: DC | PRN
  Administered 2022-10-16: 500 mL

## 2022-10-16 MED ORDER — MIDAZOLAM HCL 5 MG/5ML IJ SOLN
INTRAMUSCULAR | Status: AC
  Filled 2022-10-16: qty 5

## 2022-10-16 SURGICAL SUPPLY — 18 items
BALLN LUTONIX AV 8X40X75 (BALLOONS) ×1
BALLN LUTONIX AV 8X60X75 (BALLOONS) ×1
BALLN MUSTANG 6.0X40 75 (BALLOONS) ×1
BALLOON LUTONIX AV 8X40X75 (BALLOONS) IMPLANT
BALLOON LUTONIX AV 8X60X75 (BALLOONS) IMPLANT
BALLOON MUSTANG 6.0X40 75 (BALLOONS) IMPLANT
COVER DOME SNAP 22 D (MISCELLANEOUS) ×1 IMPLANT
GLIDEWIRE ADV .035X260CM (WIRE) IMPLANT
KIT ENCORE 26 ADVANTAGE (KITS) IMPLANT
KIT MICROPUNCTURE NIT STIFF (SHEATH) IMPLANT
PROTECTION STATION PRESSURIZED (MISCELLANEOUS) ×1
SHEATH PINNACLE R/O II 6F 4CM (SHEATH) IMPLANT
SHEATH PROBE COVER 6X72 (BAG) ×1 IMPLANT
STATION PROTECTION PRESSURIZED (MISCELLANEOUS) ×1 IMPLANT
STOPCOCK MORSE 400PSI 3WAY (MISCELLANEOUS) ×1 IMPLANT
TRAY PV CATH (CUSTOM PROCEDURE TRAY) ×1 IMPLANT
TUBING CIL FLEX 10 FLL-RA (TUBING) ×1 IMPLANT
WIRE STARTER BENTSON 035X150 (WIRE) IMPLANT

## 2022-10-16 NOTE — Op Note (Signed)
Patient name: Brian Fuller MRN: [id number] DOB: Jan 09, 1951 Sex: male  10/16/2022 Pre-operative Diagnosis: Left arm fistula malfunction Post-operative diagnosis:  Same Surgeon:  Broadus John, MD Procedure Performed: 1.  Micropuncture access of the left arm brachiocephalic fistula 2.  Fistulogram 3.  Drug-coated balloon angioplasty 8 x 60 mm, 8 x 40 mm cephalic, axillary vein confluence, proximal cephalic vein   Indications:  Patient seen and examined in preop holding.  No complaints. No changes to medication history or physical exam since last seen in clinic. Team.  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  Fortunately, there is no pain associated.  He has no symptoms of steal syndrome.  He has had some prolonged bleeding after dialysis, but states this is due to placing the bandages incorrectly.  He presents today for fistulogram in an effort to ensure there is no outflow stenosis.  Being that he has no pain, and has healthy dermis over the aneurysmal portions of this fistula, there is no role for fistula revision at this time. I had a long conversation with Brian Fuller through the interpreter, and also a long conversation with his wife and long-term friend who also acts as an Astronomer.  Everyone felt comfortable moving forward. After discussing the risks and benefits of fistulogram to assess outflow stenosis, Brian Fuller elected to proceed.  Findings:  Greater than 60% stenosis at the cephalic vein, axillary vein confluence, 60% stenosis in the proximal cephalic vein.  Otherwise, the fistula was widely patent.     Procedure:  The patient was identified in the holding area and taken to room 8.  The patient was then placed supine on the table and prepped and draped in the usual sterile fashion.  A time out was called.  The distal portion of cephalic vein was accessed using a micropuncture needle.  A 5 French micro sheath was placed, followed by a left arm  fistulogram.  I elected to intervene on the proximal cephalic vein, cephalic vein confluence.   The patient was given 2000 units IV heparin.  A 6 mm x 40 mm balloon was brought onto the field and inflated across the cephalic vein confluence, and again across the proximal cephalic vein at the level of the coracoid process.  There is waisting of the coracoid process, otherwise the balloon expanded well.  Next, I moved to an 8 x 40 mm drug-coated balloon which was expanded at the confluence.  There was excellent expansion with no waisting.  Next, the 8 x 60 mm drug-coated balloon was used extending along the proximal cephalic vein at the level of the coracoid process.  There was a waist appreciated and the balloon was inflated to 18 atm.  The balloon ruptured when inflated to 20 atm, however there was no extravasation.  Follow-up angiography demonstrated significant improvement with less than 30% stenosis at the cephalic vein confluence, less than 20% stenosis at the proximal cephalic vein.  The case was terminated and venous access closed with the use of a suture.  I had a long discussion with Brian Fuller, his wife, and his friend after the case regarding the stenosis at the cephalic vein confluence.  Being that the fistula still works, and has not had significant post cannulation bleeding, he does not need further intervention.  I do not think a stent in this area would be beneficial, would likely cause occlusion over time.  Should issues occur, would discuss cephalic vein turndown versus ligation and new fistula creation  Brian Santee, MD [facility] [place] [facility] Office: [phone]

## 2022-10-16 NOTE — Progress Notes (Addendum)
Upon patient being placed in Short stay room 4, this RN was able to get patient ready for pre-procedure. The patient, this RN and the spanish interpreter were in the room at the time of assessment. The patient, although spanish speaking was able to answer all questions and sign procedure consent with the help of spanish interpreter. If patient had any questions, this RN was able to make sure all questions were answered via [place] interpreter before leaving the room. Call bell in reach, with bed in lowest position.

## 2022-10-16 NOTE — H&P (Signed)
HISTORY AND PHYSICAL   Patient seen and examined in preop holding.  No complaints. No changes to medication history or physical exam since last seen in clinic. Team.  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  Fortunately, there is no pain associated.  He has no symptoms of steal syndrome.  He has had some prolonged bleeding after dialysis, but states this is due to placing the bandages incorrectly.  He presents today for fistulogram in an effort to ensure there is no outflow stenosis.  Being that he has no pain, and has healthy dermis over the aneurysmal portions of this fistula, there is no role for fistula revision at this time. I had a long conversation with Georgiy through the interpreter, and also a long conversation with his wife and long-term friend who also acts as an Astronomer.  Everyone felt comfortable moving forward. After discussing the risks and benefits of fistulogram to assess outflow stenosis, [name] elected to proceed.   Broadus John MD   CC:  dialysis access Requesting Provider:  No ref. provider found  HPI: This is a 72 y.o. male here for evaluation of hemodialysis access.  Pt has hx of ESRD.  Pt had left BC AVF creation 04/26/2015 by Dr. Kellie Simmering.    He is here today with his friend [name] who is translating.  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  He states that he does not have any prolonged bleeding unless they do not put the bandages on correctly after HD.  They really have not had any issues with dialysis with the machines alarming.   He does not have any ulcerations on the fistula.  He states they stick it on the sides of the fistula.  T  Pt is on dialysis.   Days of dialysis if applicable:  T/T/S    HD center:  [place] location.  He has hx of DM.  He is not on blood thinners.  The pt is on a statin for cholesterol management.  The pt is not on a daily aspirin.  Other AC:  none The pt is on BB, ARB for  hypertension.  The pt does have diabetes.   Tobacco hx:  former  Past Medical History:  Diagnosis Date   Anemia    Chronic kidney disease    dialysis, T/Th/Sat   Diabetes mellitus without complication ([place])    Diabetic retinopathy ([place])    ESRD (end stage renal disease) ([place])    Glaucoma    Hypertension    Hyperthyroidism    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    Pneumonia    Systolic murmur     Past Surgical History:  Procedure Laterality Date   AMPUTATION TOE Left 09/05/2021   Procedure: AMPUTATION Left Second Toe;  Surgeon: Willaim Sheng, MD;  Location: [place];  Service: Orthopedics;  Laterality: Left;   AV FISTULA PLACEMENT Left 11/02/2014   Procedure: ARTERIOVENOUS (AV) FISTULA CREATION;  Surgeon: Mal Misty, MD;  Location: [place];  Service: Vascular;  Laterality: Left;   AV FISTULA PLACEMENT Left 04/26/2015   Procedure: LEFT BRACHIOCEPHALIC ARTERIOVENOUS (AV) FISTULA CREATION;  Surgeon: Conrad Wyndmere, MD;  Location: [place];  Service: Vascular;  Laterality: Left;   EYE SURGERY     INSERTION OF DIALYSIS CATHETER Right 11/04/2014   Procedure: INSERTION OF DIALYSIS CATHETER RIGHT INTERNAL JUGULAR VEIN;  Surgeon: Mal Misty, MD;  Location: [place];  Service:  Vascular;  Laterality: Right;   LIGATION OF ARTERIOVENOUS  FISTULA Left 04/26/2015   Procedure: LIGATION OF LEFT RADIOCEPHALIC ARTERIOVENOUS  FISTULA;  Surgeon: Conrad [place], MD;  Location: [place];  Service: Vascular;  Laterality: Left;   REVISON OF ARTERIOVENOUS FISTULA Left 01/18/2015   Procedure: REVISON OF LEFT RADIOCEPHALIC ARTERIOVENOUS FISTULA USING VASCU-GUARD PERIPHERAL VASCULAR PATCH ;  Surgeon: Mal Misty, MD;  Location: [facility];  Service: Vascular;  Laterality: Left;   TONSILLECTOMY      Allergies  Allergen Reactions   Metformin And Related Other (See Comments)    Metformin contraindication in ESRD   Penicillins Rash    Current Facility-Administered Medications  Medication Dose Route Frequency Provider  Last Rate Last Admin   sodium chloride flush (NS) 0.9 % injection 3 mL  3 mL Intravenous PRN Broadus John, MD        Family History  Problem Relation Age of Onset   Diabetes Mother    Diabetes Father    Diabetes Brother    Hypertension Sister     Social History   Socioeconomic History   Marital status: Married    Spouse name: Not on file   Number of children: Not on file   Years of education: Not on file   Highest education level: Not on file  Occupational History   Not on file  Tobacco Use   Smoking status: Former    Types: Cigarettes    Quit date: 01/17/1984    Years since quitting: 38.7    Passive exposure: Never   Smokeless tobacco: Never  Vaping Use   Vaping Use: Never used  Substance and Sexual Activity   Alcohol use: No    Alcohol/week: 0.0 standard drinks of alcohol   Drug use: No   Sexual activity: Not on file  Other Topics Concern   Not on file  Social History Narrative   Not on file   Social Determinants of Health   Financial Resource Strain: Not on file  Food Insecurity: Not on file  Transportation Needs: Not on file  Physical Activity: Not on file  Stress: Not on file  Social Connections: Not on file  Intimate Partner Violence: Not on file     ROS: '[x]'$  Positive   '[ ]'$  Negative   '[ ]'$  All sytems reviewed and are negative  Cardiac: '[]'$  chest pain/pressure '[]'$  SOB/DOE  Vascular: '[]'$  pain in legs while walking '[]'$  pain in feet when lying flat '[]'$  swelling in legs  Pulmonary: '[]'$  asthma '[]'$  wheezing  Neurologic: '[]'$  hx CVA/TIA  Hematologic: '[]'$  bleeding problems  GI '[]'$  GERD  GU: '[x]'$  CKD/renal failure  '[x]'$  HD---'[]'$  M/W/F '[x]'$  T/T/S  Psychiatric: '[]'$  hx of major depression  Integumentary: '[]'$  rashes '[]'$  ulcers  Constitutional: '[]'$  fever '[]'$  chills   PHYSICAL EXAMINATION:  Today's Vitals   10/16/22 0920 10/16/22 0921  BP: (!) 185/76   Pulse: 78   Resp: 17   Temp: (!) 97.2 F (36.2 C)   TempSrc: Temporal   SpO2: 99%   Weight:  65.9 kg   Height: '5\' 3"'$  (1.6 m)   PainSc:  0-No pain   Body mass index is 25.74 kg/m.    General:  WDWN male in NAD Gait: Not observed HENT: WNL Pulmonary: normal non-labored breathing  Cardiac: regular Skin: without rashes Vascular Exam/Pulses:   Right Left  Radial 2+ (normal) 2+ (normal)   Extremities:  fistula has a good thrill distal and proximal to the aneurysmal areas.  There  is some hypopigmentation of the skin over the aneurysmal area but the skin is loose over the fistula and no ulcerations present.    Musculoskeletal: no muscle wasting or atrophy  Neurologic: A&O X 3    ASSESSMENT/PLAN: 72 y.o. male with ESRD here for evaluation of his hemodialysis access with hx of left BC AVF created in 2016 by Dr. Kellie Simmering.    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  Prior to consideration of plication of fistula, he will need fistulogram to evaluate.  Discussed with [name] and pt that if there is an area of narrowing, we may be able to intervene and would do this prior to any plication.  Discussed that if the fistula needs plication, he may require a dialysis catheter if there is not enough room to stick the fistula.  Further discussions about this after fistulogram and findings at that time. -we will plan this for a non dialysis day.     Leontine Locket, [facility] [phone]  Clinic MD:   Carlis Abbott

## 2022-10-17 MED FILL — Midazolam HCl Inj 5 MG/5ML (Base Equivalent): INTRAMUSCULAR | Qty: 5 | Status: AC

## 2022-10-21 ENCOUNTER — Other Ambulatory Visit: Payer: Self-pay

## 2022-10-30 ENCOUNTER — Encounter (HOSPITAL_COMMUNITY): Payer: Self-pay | Admitting: *Deleted

## 2022-10-30 ENCOUNTER — Other Ambulatory Visit: Payer: Self-pay

## 2022-11-04 ENCOUNTER — Other Ambulatory Visit: Payer: Self-pay | Admitting: Nurse Practitioner

## 2022-11-04 ENCOUNTER — Other Ambulatory Visit: Payer: Self-pay

## 2022-11-04 DIAGNOSIS — E1122 Type 2 diabetes mellitus with diabetic chronic kidney disease: Secondary | ICD-10-CM

## 2022-11-05 ENCOUNTER — Other Ambulatory Visit: Payer: Self-pay

## 2022-11-05 ENCOUNTER — Ambulatory Visit: Payer: Self-pay | Attending: Nurse Practitioner | Admitting: Nurse Practitioner

## 2022-11-05 ENCOUNTER — Encounter: Payer: Self-pay | Admitting: Nurse Practitioner

## 2022-11-05 ENCOUNTER — Encounter (HOSPITAL_COMMUNITY): Payer: Self-pay | Admitting: *Deleted

## 2022-11-05 VITALS — BP 158/68 | HR 87 | Ht 63.0 in | Wt 147.0 lb

## 2022-11-05 DIAGNOSIS — E1140 Type 2 diabetes mellitus with diabetic neuropathy, unspecified: Secondary | ICD-10-CM

## 2022-11-05 DIAGNOSIS — E1122 Type 2 diabetes mellitus with diabetic chronic kidney disease: Secondary | ICD-10-CM

## 2022-11-05 DIAGNOSIS — E1159 Type 2 diabetes mellitus with other circulatory complications: Secondary | ICD-10-CM

## 2022-11-05 DIAGNOSIS — Z992 Dependence on renal dialysis: Secondary | ICD-10-CM

## 2022-11-05 DIAGNOSIS — N186 End stage renal disease: Secondary | ICD-10-CM

## 2022-11-05 DIAGNOSIS — I152 Hypertension secondary to endocrine disorders: Secondary | ICD-10-CM

## 2022-11-05 LAB — POCT GLYCOSYLATED HEMOGLOBIN (HGB A1C): Hemoglobin A1C: 7.9 % — AB (ref 4.0–5.6)

## 2022-11-05 MED ORDER — GABAPENTIN 100 MG PO CAPS
100.0000 mg | ORAL_CAPSULE | Freq: Every day | ORAL | 3 refills | Status: DC
  Filled 2022-11-05: qty 90, 90d supply, fill #0

## 2022-11-05 MED ORDER — CINACALCET HCL 30 MG PO TABS
ORAL_TABLET | ORAL | 3 refills | Status: AC
  Filled 2022-11-05: qty 90, 90d supply, fill #0
  Filled 2022-11-21: qty 30, 30d supply, fill #0
  Filled 2022-12-06 – 2023-01-09 (×2): qty 30, 30d supply, fill #1
  Filled 2023-02-04 – 2023-04-30 (×2): qty 30, 30d supply, fill #2
  Filled 2023-06-23: qty 30, 30d supply, fill #3
  Filled 2023-08-05: qty 30, 30d supply, fill #4
  Filled 2023-09-29: qty 30, 30d supply, fill #5

## 2022-11-05 MED ORDER — INSULIN LISPRO PROT & LISPRO (75-25 MIX) 100 UNIT/ML KWIKPEN
15.0000 [IU] | PEN_INJECTOR | Freq: Two times a day (BID) | SUBCUTANEOUS | 1 refills | Status: DC
  Filled 2022-11-05: qty 45, 150d supply, fill #0
  Filled 2022-12-05: qty 15, 50d supply, fill #0
  Filled 2022-12-05: qty 15, 30d supply, fill #0
  Filled 2023-02-04: qty 15, 50d supply, fill #1
  Filled 2023-04-30: qty 15, 50d supply, fill #2

## 2022-11-05 NOTE — Progress Notes (Signed)
Family friend interpreter - In person Right foot pain

## 2022-11-05 NOTE — Progress Notes (Signed)
Assessment & Plan:  Brian Fuller was seen today for hypertension and diabetes.  Diagnoses and all orders for this visit:  Type 2 diabetes mellitus with chronic kidney disease on chronic dialysis, without long-term current use of insulin  DOSE INCREASE OF HUMALOG TODAY FROM 12 units BID to 15 units BID -     POCT glycosylated hemoglobin (Hb A1C) -     Insulin Lispro Prot & Lispro (HUMALOG MIX 75/25 KWIKPEN) (75-25) 100 UNIT/ML Kwikpen; Inject 15 Units into the skin in the morning and at bedtime. Continue blood sugar control as discussed in office today, low carbohydrate diet, and regular physical exercise as tolerated, 150 minutes per week (30 min each day, 5 days per week, or 50 min 3 days per week). Keep blood sugar logs with fasting goal of 90-130 mg/dl, post prandial (after you eat) less than 180.  For Hypoglycemia: BS <60 and Hyperglycemia BS >400; contact the clinic ASAP. Annual eye exams and foot exams are recommended.   Hypertension associated with type 2 diabetes mellitus  Continue losartan, amlodipine and carvedilol Bring all medications and blood pressure monitor to next appt with pharmacist  Neuropathy due to type 2 diabetes mellitus ([place]) -     gabapentin (NEURONTIN) 100 MG capsule; Take 1 capsule (100 mg total) by mouth at bedtime.    Patient has been counseled on age-appropriate routine health concerns for screening and prevention. These are reviewed and up-to-date. Referrals have been placed accordingly. Immunizations are up-to-date or declined.    Subjective:   Chief Complaint  Patient presents with   Hypertension   Diabetes   [name] 72 y.o. male presents to office today for follow up to HTN and DM  He is accompanied by a family friend who is assisting with interpreting for him.   He has a past medical history of Anemia, Chronic kidney disease, DM 2, Diabetic retinopathy, ESRD, Glaucoma, Hypertension, Hyperthyroidism, Loss of vision (08/20/2015),  Pneumonia, and Systolic murmur.   HTN Blood pressure is elevated despite his endorsement of taking amlodipine 10 mg daily, carvedilol 6.125 mg BID and losartan 25 mg daily as prescribed.  He states his BP at home is 110-120/70-80s.  BP Readings from Last 3 Encounters:  11/05/22 (!) 158/68  10/16/22 (!) 177/73  10/15/22 (!) 161/79     DM2 Not much improvement since last visit. Will increase humalog 75/25 to 15 units BID. He will continue on Januvia.  LDL at goal with low intensity statin Lab Results  Component Value Date   HGBA1C 7.9 (A) 11/05/2022    Lab Results  Component Value Date   HGBA1C 7.2 (A) 07/31/2022    Lab Results  Component Value Date   LDLCALC 71 09/05/2021    Right foot pain Reports aching on bottom of right foot. Pain lasts for a few minutes and goes away when he is walking. Is worse at night when he is trying to sleep. Was prescribed gabapentin 300 mg in the past which caused dizziness. Will try a lower dose at this time. 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   Review of Systems  Constitutional:  Negative for fever, malaise/fatigue and weight loss.  HENT: Negative.  Negative for nosebleeds.   Eyes: Negative.  Negative for blurred vision, double vision and photophobia.  Respiratory: Negative.  Negative for cough and shortness of breath.   Cardiovascular: Negative.  Negative for chest pain, palpitations and leg swelling.  Gastrointestinal: Negative.  Negative for heartburn, nausea and vomiting.  Musculoskeletal: Negative.  Negative for myalgias.  Neurological:  Positive for tingling and sensory change. Negative for dizziness, focal weakness, seizures and headaches.  Psychiatric/Behavioral: Negative.  Negative for suicidal ideas.     Past Medical History:  Diagnosis Date   Anemia    Chronic kidney disease    dialysis, T/Th/Sat   Diabetes mellitus without complication ([place])    Diabetic retinopathy ([place])    ESRD (end stage renal disease) ([place])     Glaucoma    Hypertension    Hyperthyroidism    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    Pneumonia    Systolic murmur     Past Surgical History:  Procedure Laterality Date   A/V FISTULAGRAM Left 10/16/2022   Procedure: A/V Fistulagram;  Surgeon: Broadus John, MD;  Location: [place] [facility];  Service: Cardiovascular;  Laterality: Left;   AMPUTATION TOE Left 09/05/2021   Procedure: AMPUTATION Left Second Toe;  Surgeon: Willaim Sheng, MD;  Location: [place];  Service: Orthopedics;  Laterality: Left;   AV FISTULA PLACEMENT Left 11/02/2014   Procedure: ARTERIOVENOUS (AV) FISTULA CREATION;  Surgeon: Mal Misty, MD;  Location: [place];  Service: Vascular;  Laterality: Left;   AV FISTULA PLACEMENT Left 04/26/2015   Procedure: LEFT BRACHIOCEPHALIC ARTERIOVENOUS (AV) FISTULA CREATION;  Surgeon: Conrad Bacon, MD;  Location: [place];  Service: Vascular;  Laterality: Left;   EYE SURGERY     INSERTION OF DIALYSIS CATHETER Right 11/04/2014   Procedure: INSERTION OF DIALYSIS CATHETER RIGHT INTERNAL JUGULAR VEIN;  Surgeon: Mal Misty, MD;  Location: [place];  Service: Vascular;  Laterality: Right;   LIGATION OF ARTERIOVENOUS  FISTULA Left 04/26/2015   Procedure: LIGATION OF LEFT RADIOCEPHALIC ARTERIOVENOUS  FISTULA;  Surgeon: Conrad Howe, MD;  Location: [place];  Service: Vascular;  Laterality: Left;   PERIPHERAL VASCULAR INTERVENTION Left 10/16/2022   Procedure: PERIPHERAL VASCULAR INTERVENTION;  Surgeon: Broadus John, MD;  Location: [place] [facility];  Service: Cardiovascular;  Laterality: Left;  left AV fistula   REVISON OF ARTERIOVENOUS FISTULA Left 01/18/2015   Procedure: REVISON OF LEFT RADIOCEPHALIC ARTERIOVENOUS FISTULA USING VASCU-GUARD PERIPHERAL VASCULAR PATCH ;  Surgeon: Mal Misty, MD;  Location: [facility] OR;  Service: Vascular;  Laterality: Left;   TONSILLECTOMY      Family History  Problem Relation Age of Onset   Diabetes Mother    Diabetes Father    Diabetes Brother     Hypertension Sister     Social History Reviewed with no changes to be made today.   Outpatient Medications Prior to Visit  Medication Sig Dispense Refill   amLODipine (NORVASC) 10 MG tablet Take 1 tablet (10 mg total) by mouth daily. para la presi n arterial. 90 tablet 1   atorvastatin (LIPITOR) 10 MG tablet Take 1 tablet (10 mg total) by mouth daily. para el colesterol 90 tablet 0   Blood Glucose Monitoring Suppl (TRUE METRIX METER) w/Device KIT Use as instructed. Check blood glucose level by fingerstick twice per day. 1 kit 0   calcium acetate (PHOSLO) 667 MG capsule Take 1 capsule (667 mg total) by mouth 3 (three) times daily with meals. 90 capsule 0   carvedilol (COREG) 6.25 MG tablet Take 1 tablet (6.25 mg total) by mouth 2 (two) times daily with a meal. PARA LA PRECION 180 tablet 1   cinacalcet (SENSIPAR) 60 MG tablet Take 60 mg by mouth daily with lunch.     ferric citrate (AURYXIA)  1 GM 210 MG(Fe) tablet Take 420 mg by mouth 2 (two) times daily with a meal.     glucose blood (TRUE METRIX BLOOD GLUCOSE TEST) test strip Use as instructed. Check blood glucose level by fingerstick twice per day. 100 each 12   Insulin Pen Needle (B-D UF III MINI PEN NEEDLES) 31G X 5 MM MISC Use as instructed. Inject into the skin once daily 100 each 3   JANUVIA 25 MG tablet TAKE ONE TABLET BY MOUTH EVERY DAY 90 tablet 0   losartan (COZAAR) 25 MG tablet Take 1 tablet (25 mg total) by mouth daily. para la presi n arterial 90 tablet 1   sodium zirconium cyclosilicate (LOKELMA) 5 g packet Take 1 packet by mouth three times a week Mix powder with 55ml water and drink. Please take three times weekly 13 packet 2   TRUEplus Lancets 28G MISC Use as instructed. Check blood glucose level by fingerstick twice per day 100 each 3   Insulin Lispro Prot & Lispro (HUMALOG MIX 75/25 KWIKPEN) (75-25) 100 UNIT/ML Kwikpen Inject 12 Units into the skin in the morning and at bedtime. 6 mL 2   Facility-Administered Medications  Prior to Visit  Medication Dose Route Frequency Provider Last Rate Last Admin   0.9 %  sodium chloride infusion  250 mL Intravenous PRN Broadus John, MD       sodium chloride flush (NS) 0.9 % injection 3 mL  3 mL Intravenous Q12H Broadus John, MD        Allergies  Allergen Reactions   Metformin And Related Other (See Comments)    Metformin contraindication in ESRD       Objective:    BP (!) 158/68   Pulse 87   Ht 5\' 3"  (1.6 m)   Wt 147 lb (66.7 kg)   SpO2 98%   BMI 26.04 kg/m  Wt Readings from Last 3 Encounters:  11/05/22 147 lb (66.7 kg)  10/16/22 145 lb 4.5 oz (65.9 kg)  10/15/22 145 lb 4.8 oz (65.9 kg)    Physical Exam Vitals and nursing note reviewed.  Constitutional:      Appearance: He is well-developed.  HENT:     Head: Normocephalic and atraumatic.  Cardiovascular:     Rate and Rhythm: Normal rate and regular rhythm.     Heart sounds: Normal heart sounds. No murmur heard.    No friction rub. No gallop.  Pulmonary:     Effort: Pulmonary effort is normal. No tachypnea or respiratory distress.     Breath sounds: Normal breath sounds. No decreased breath sounds, wheezing, rhonchi or rales.  Chest:     Chest wall: No tenderness.  Abdominal:     General: Bowel sounds are normal.     Palpations: Abdomen is soft.  Musculoskeletal:        General: Normal range of motion.     Cervical back: Normal range of motion.  Skin:    General: Skin is warm and dry.  Neurological:     Mental Status: He is alert and oriented to person, place, and time.     Coordination: Coordination normal.  Psychiatric:        Behavior: Behavior normal. Behavior is cooperative.        Thought Content: Thought content normal.        Judgment: Judgment normal.          Patient has been counseled extensively about nutrition and exercise as well as the importance of adherence  with medications and regular follow-up. The patient was given clear instructions to go to ER or return to  medical center if symptoms don't improve, worsen or new problems develop. The patient verbalized understanding.   Follow-up: Return for see me in 3 months. do not cancel appt with luke in april.   Gildardo Pounds, FNP-BC [facility] and [place], [place]   11/05/2022, 2:52 PM

## 2022-11-06 ENCOUNTER — Other Ambulatory Visit: Payer: Self-pay

## 2022-11-07 ENCOUNTER — Other Ambulatory Visit: Payer: Self-pay

## 2022-11-07 MED ORDER — LOKELMA 5 G PO PACK: PACK | ORAL | 2 refills | Status: DC

## 2022-11-12 ENCOUNTER — Other Ambulatory Visit: Payer: Self-pay

## 2022-11-13 ENCOUNTER — Other Ambulatory Visit: Payer: Self-pay

## 2022-11-21 ENCOUNTER — Encounter (HOSPITAL_COMMUNITY): Payer: Self-pay | Admitting: *Deleted

## 2022-11-21 ENCOUNTER — Other Ambulatory Visit: Payer: Self-pay

## 2022-11-22 ENCOUNTER — Other Ambulatory Visit: Payer: Self-pay

## 2022-12-05 ENCOUNTER — Other Ambulatory Visit (HOSPITAL_COMMUNITY): Payer: Self-pay

## 2022-12-05 ENCOUNTER — Other Ambulatory Visit: Payer: Self-pay

## 2022-12-05 ENCOUNTER — Encounter (HOSPITAL_COMMUNITY): Payer: Self-pay | Admitting: *Deleted

## 2022-12-06 ENCOUNTER — Encounter: Payer: Self-pay | Admitting: Pharmacist

## 2022-12-06 ENCOUNTER — Encounter (HOSPITAL_COMMUNITY): Payer: Self-pay | Admitting: *Deleted

## 2022-12-06 ENCOUNTER — Other Ambulatory Visit: Payer: Self-pay

## 2022-12-06 ENCOUNTER — Ambulatory Visit: Payer: Self-pay | Attending: Nurse Practitioner | Admitting: Pharmacist

## 2022-12-06 VITALS — BP 169/69 | HR 74

## 2022-12-06 DIAGNOSIS — I10 Essential (primary) hypertension: Secondary | ICD-10-CM

## 2022-12-06 MED ORDER — CARVEDILOL 12.5 MG PO TABS
12.5000 mg | ORAL_TABLET | Freq: Two times a day (BID) | ORAL | 3 refills | Status: DC
  Filled 2022-12-06: qty 60, 30d supply, fill #0
  Filled 2023-01-09: qty 60, 30d supply, fill #1
  Filled 2023-02-04: qty 60, 30d supply, fill #2
  Filled 2023-04-30: qty 60, 30d supply, fill #3

## 2022-12-06 NOTE — Progress Notes (Signed)
S:     No chief complaint on file.  72 y.o. male who presents for hypertension evaluation, education, and management.  PMH is significant for HTN, T2DM w/ retinopathy, nephropathy, ESRD on TThSat HD.  Patient was referred and last seen by Primary Care Provider, Bertram Denver, on 11/05/2022. BP was  158/68 mmHg at that visit.   Today, patient arrives in good spirits and presents with the assistance of an in-person translator. Denies dizziness, headache, blurred vision, swelling. Of note, his HD sessions are on Tuesdays, Thursdays, and Saturdays. Does not have his medications with him but tells me he has taken his losartan, amlodipine, and AM dose of carvedilol today. Reports adherence. The interpreter helps him fill a pill organizer.   Family/Social history:  Fhx: DM, HTN Tobacco: former smoker (quit in 1985) Alcohol: none reported   Medication adherence reported. Patient has taken BP medications today.   Current antihypertensives include: amlodipine 10 mg daily, carvedilol 6.25 mg BID, losartan 25 mg daily   Reported home BP readings:  -Reports hypotensive readings on dialysis days. Gives readings in the 90s/60s. -Does not have a cuff with him today.  -On non-dialysis days, his BP is higher. For example, gives reading of 120/96 mmHg.   Patient reported dietary habits:  -Endorses strict compliance with sodium restriction.  -Denies drinking caffeine.   Patient-reported exercise habits:  -Nothing formal, however, he is active in the yard at home   O:  Vitals:   12/06/22 1417  BP: (!) 169/69  Pulse: 74     Last 3 Office BP readings: BP Readings from Last 3 Encounters:  12/06/22 (!) 169/69  11/05/22 (!) 158/68  10/16/22 (!) 177/73    BMET    Component Value Date/Time   NA 134 (L) 10/16/2022 0925   NA 135 11/19/2021 1022   K 5.2 (H) 10/16/2022 0925   CL 96 (L) 10/16/2022 0925   CO2 28 11/19/2021 1022   GLUCOSE 124 (H) 10/16/2022 0925   BUN 36 (H) 10/16/2022 0925    BUN 54 (H) 11/19/2021 1022   CREATININE 7.70 (H) 10/16/2022 0925   CREATININE 6.82 (H) 01/18/2016 1027   CALCIUM 8.3 (L) 11/19/2021 1022   GFRNONAA 7 (L) 09/06/2021 0302   GFRNONAA 8 (L) 01/18/2016 1027   GFRAA 9 (L) 01/18/2016 1027    Renal function: CrCl cannot be calculated (Patient's most recent lab result is older than the maximum 21 days allowed.).  Clinical ASCVD: No  The ASCVD Risk score (Arnett DK, et al., 2019) failed to calculate for the following reasons:   The valid total cholesterol range is 130 to 320 mg/dL  Patient is participating in a Managed Medicaid Plan:  no    A/P: Hypertension diagnosed currently uncontrolled on current medications. BP goal < 130/80 mmHg. Medication adherence appears appropriate but I'm hesitant about this. He is unable to verbalize medications, doses or frequencies. His interpreter does this form him and helps manage his BP medications at home. He is completely dependent on her for this. His BP in clinic is consistently elevated, however, we are limited with the occurrence of hypotension associated with HD.  -Continued amlodipine 10 mg daily, losartan 25 mg daily. -Increase carvedilol dose to 25 mg BID.   -Patient educated on purpose, proper use, and potential adverse effects of carvedilol.  -F/u labs ordered - none today. -Counseled on lifestyle modifications for blood pressure control including reduced dietary sodium, increased exercise, adequate sleep. -Encouraged patient to check BP at home and bring  log of readings to next visit. Counseled on proper use of home BP cuff.   Results reviewed and written information provided.    Written patient instructions provided. Patient verbalized understanding of treatment plan.  Total time in face to face counseling 20 minutes.    Follow-up:  Pharmacist in 1 month.  Butch Penny, PharmD, Patsy [place], CPP Clinical Pharmacist [facility] [phone]

## 2022-12-19 ENCOUNTER — Other Ambulatory Visit: Payer: Self-pay

## 2022-12-19 MED ORDER — LOKELMA 10 G PO PACK
PACK | ORAL | 2 refills | Status: AC
  Filled 2022-12-19: qty 13, 30d supply, fill #0

## 2022-12-26 ENCOUNTER — Other Ambulatory Visit: Payer: Self-pay

## 2023-01-06 ENCOUNTER — Ambulatory Visit: Payer: Self-pay | Admitting: Pharmacist

## 2023-01-09 ENCOUNTER — Encounter (HOSPITAL_COMMUNITY): Payer: Self-pay | Admitting: *Deleted

## 2023-01-09 ENCOUNTER — Other Ambulatory Visit: Payer: Self-pay

## 2023-01-09 ENCOUNTER — Other Ambulatory Visit: Payer: Self-pay | Admitting: Nurse Practitioner

## 2023-01-09 DIAGNOSIS — E1169 Type 2 diabetes mellitus with other specified complication: Secondary | ICD-10-CM

## 2023-01-09 DIAGNOSIS — E1159 Type 2 diabetes mellitus with other circulatory complications: Secondary | ICD-10-CM

## 2023-01-09 MED ORDER — AMLODIPINE BESYLATE 10 MG PO TABS
10.0000 mg | ORAL_TABLET | Freq: Every day | ORAL | 1 refills | Status: DC
  Filled 2023-01-09 – 2023-02-04 (×2): qty 90, 90d supply, fill #0
  Filled 2023-04-30: qty 90, 90d supply, fill #1

## 2023-01-09 MED ORDER — ATORVASTATIN CALCIUM 10 MG PO TABS
10.0000 mg | ORAL_TABLET | Freq: Every day | ORAL | 1 refills | Status: DC
  Filled 2023-01-09: qty 90, 90d supply, fill #0

## 2023-01-09 MED ORDER — LOSARTAN POTASSIUM 25 MG PO TABS
25.0000 mg | ORAL_TABLET | Freq: Every day | ORAL | 1 refills | Status: DC
  Filled 2023-01-09 – 2023-01-15 (×2): qty 90, 90d supply, fill #0
  Filled 2023-04-30: qty 90, 90d supply, fill #1

## 2023-01-15 ENCOUNTER — Encounter (HOSPITAL_COMMUNITY): Payer: Self-pay | Admitting: *Deleted

## 2023-01-15 ENCOUNTER — Other Ambulatory Visit: Payer: Self-pay

## 2023-01-16 ENCOUNTER — Other Ambulatory Visit: Payer: Self-pay

## 2023-01-17 ENCOUNTER — Other Ambulatory Visit: Payer: Self-pay | Admitting: Nurse Practitioner

## 2023-01-17 ENCOUNTER — Other Ambulatory Visit: Payer: Self-pay

## 2023-01-17 ENCOUNTER — Other Ambulatory Visit: Payer: Self-pay | Admitting: Pharmacist

## 2023-01-17 DIAGNOSIS — E1122 Type 2 diabetes mellitus with diabetic chronic kidney disease: Secondary | ICD-10-CM

## 2023-01-17 MED ORDER — JANUVIA 25 MG PO TABS
25.0000 mg | ORAL_TABLET | Freq: Every day | ORAL | 0 refills | Status: DC
  Filled 2023-01-17: qty 90, 90d supply, fill #0
  Filled 2023-01-21: qty 14, 14d supply, fill #0

## 2023-01-20 ENCOUNTER — Other Ambulatory Visit: Payer: Self-pay

## 2023-01-21 ENCOUNTER — Other Ambulatory Visit: Payer: Self-pay

## 2023-01-21 ENCOUNTER — Encounter (HOSPITAL_COMMUNITY): Payer: Self-pay | Admitting: *Deleted

## 2023-01-23 ENCOUNTER — Other Ambulatory Visit: Payer: Self-pay

## 2023-01-31 ENCOUNTER — Other Ambulatory Visit: Payer: Self-pay

## 2023-02-04 ENCOUNTER — Ambulatory Visit: Payer: Self-pay | Attending: Nurse Practitioner | Admitting: Nurse Practitioner

## 2023-02-04 ENCOUNTER — Other Ambulatory Visit: Payer: Self-pay

## 2023-02-04 ENCOUNTER — Encounter (HOSPITAL_COMMUNITY): Payer: Self-pay | Admitting: *Deleted

## 2023-02-04 ENCOUNTER — Encounter: Payer: Self-pay | Admitting: Nurse Practitioner

## 2023-02-04 VITALS — BP 133/68 | HR 79 | Ht 63.0 in | Wt 147.6 lb

## 2023-02-04 DIAGNOSIS — E1169 Type 2 diabetes mellitus with other specified complication: Secondary | ICD-10-CM

## 2023-02-04 DIAGNOSIS — E1122 Type 2 diabetes mellitus with diabetic chronic kidney disease: Secondary | ICD-10-CM

## 2023-02-04 DIAGNOSIS — Z1211 Encounter for screening for malignant neoplasm of colon: Secondary | ICD-10-CM

## 2023-02-04 DIAGNOSIS — E1140 Type 2 diabetes mellitus with diabetic neuropathy, unspecified: Secondary | ICD-10-CM

## 2023-02-04 DIAGNOSIS — H211X9 Other vascular disorders of iris and ciliary body, unspecified eye: Secondary | ICD-10-CM

## 2023-02-04 DIAGNOSIS — Z7985 Long-term (current) use of injectable non-insulin antidiabetic drugs: Secondary | ICD-10-CM

## 2023-02-04 DIAGNOSIS — I10 Essential (primary) hypertension: Secondary | ICD-10-CM

## 2023-02-04 DIAGNOSIS — Z794 Long term (current) use of insulin: Secondary | ICD-10-CM

## 2023-02-04 DIAGNOSIS — N186 End stage renal disease: Secondary | ICD-10-CM

## 2023-02-04 DIAGNOSIS — E103519 Type 1 diabetes mellitus with proliferative diabetic retinopathy with macular edema, unspecified eye: Secondary | ICD-10-CM

## 2023-02-04 DIAGNOSIS — Z992 Dependence on renal dialysis: Secondary | ICD-10-CM

## 2023-02-04 DIAGNOSIS — E785 Hyperlipidemia, unspecified: Secondary | ICD-10-CM

## 2023-02-04 LAB — POCT GLYCOSYLATED HEMOGLOBIN (HGB A1C): Hemoglobin A1C: 7.5 % — AB (ref 4.0–5.6)

## 2023-02-04 MED ORDER — GABAPENTIN 100 MG PO CAPS
100.0000 mg | ORAL_CAPSULE | Freq: Every day | ORAL | 3 refills | Status: DC
Start: 2023-02-04 — End: 2024-02-24
  Filled 2023-02-04: qty 90, 90d supply, fill #0
  Filled 2023-04-30: qty 90, 90d supply, fill #1
  Filled 2023-06-23 – 2023-08-05 (×2): qty 90, 90d supply, fill #2
  Filled 2023-11-10 (×2): qty 90, 90d supply, fill #3

## 2023-02-04 NOTE — Progress Notes (Signed)
Assessment & Plan:  Jex was seen today for hypertension and diabetes.  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 Avoid red meat/beef/steak,  fried foods. junk foods, sodas, sugary drinks, unhealthy snacking, alcohol and smoking.  Drink at least 80 oz of water per day and monitor your carbohydrate intake daily.    Neuropathy due to type 2 diabetes mellitus (HCC) Reports 300 mg made him drowsy. He never picked up the 100 mg dose. Will try today -     gabapentin (NEURONTIN) 100 MG capsule; Take 1 capsule (100 mg total) by mouth at bedtime. For neuropathy    Patient has been counseled on age-appropriate routine health concerns for screening and prevention. These are reviewed and up-to-date. Referrals have been placed accordingly. Immunizations are up-to-date or declined.    Subjective:   Chief Complaint  Patient presents with   Hypertension   Diabetes    Brian Fuller 72 y.o. male presents to office today for follow up to HTN and DM   He is accompanied by a family friend who is assisting with interpreting for him.    He has a past medical history of Anemia, Chronic kidney disease, DM 2, Diabetic retinopathy, ESRD, Glaucoma, Hypertension, Hyperthyroidism, Loss  of vision (08/20/2015), Pneumonia, and Systolic murmur  HTN Blood pressure is well controlled. He is takingamlodipine 10 mg daily, carvedilol 12.5 mg BID and losartan 25 mg daily as prescribed.   BP Readings from Last 3 Encounters:  02/04/23 133/68  12/06/22 (!) 169/69  11/05/22 (!) 158/68    DM2 Improving. A1c trending down. Will continue humalog 75/25  15 units BID and Januvia.  LDL at goal with low intensity statin Lab Results  Component Value Date   HGBA1C 7.5 (A) 02/04/2023    Lab Results  Component Value Date   HGBA1C 7.9 (A) 11/05/2022    Lab Results  Component Value Date   LDLCALC 71 09/05/2021       Review of Systems  Constitutional:  Negative for fever, malaise/fatigue and weight loss.  HENT: Negative.  Negative for nosebleeds.   Eyes: Negative.  Negative for blurred vision, double vision and photophobia.  Respiratory: Negative.  Negative for cough and shortness of breath.   Cardiovascular: Negative.  Negative for chest pain, palpitations and leg swelling.  Gastrointestinal: Negative.  Negative for heartburn, nausea and vomiting.  Musculoskeletal: Negative.  Negative for myalgias.  Neurological: Negative.  Negative for dizziness, focal weakness, seizures and headaches.  Psychiatric/Behavioral: Negative.  Negative for suicidal ideas.     Past Medical History:  Diagnosis Date   Anemia    Chronic kidney disease    dialysis, T/Th/Sat   Diabetes mellitus without complication (HCC)    Diabetic retinopathy (HCC)    ESRD (end stage renal disease) (HCC)  Glaucoma    Hypertension    Hyperthyroidism    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    Pneumonia    Systolic murmur     Past Surgical History:  Procedure Laterality Date   A/V FISTULAGRAM Left 10/16/2022   Procedure: A/V Fistulagram;  Surgeon: Victorino Sparrow, MD;  Location: [facility] INVASIVE CV LAB;  Service: Cardiovascular;  Laterality: Left;   AMPUTATION TOE Left 09/05/2021   Procedure: AMPUTATION Left Second  Toe;  Surgeon: Joen Laura, MD;  Location: [facility] OR;  Service: Orthopedics;  Laterality: Left;   AV FISTULA PLACEMENT Left 11/02/2014   Procedure: ARTERIOVENOUS (AV) FISTULA CREATION;  Surgeon: Pryor Ochoa, MD;  Location: [facility] OR;  Service: Vascular;  Laterality: Left;   AV FISTULA PLACEMENT Left 04/26/2015   Procedure: LEFT BRACHIOCEPHALIC ARTERIOVENOUS (AV) FISTULA CREATION;  Surgeon: Fransisco Hertz, MD;  Location: [facility] OR;  Service: Vascular;  Laterality: Left;   EYE SURGERY     INSERTION OF DIALYSIS CATHETER Right 11/04/2014   Procedure: INSERTION OF DIALYSIS CATHETER RIGHT INTERNAL JUGULAR VEIN;  Surgeon: Pryor Ochoa, MD;  Location: [facility] OR;  Service: Vascular;  Laterality: Right;   LIGATION OF ARTERIOVENOUS  FISTULA Left 04/26/2015   Procedure: LIGATION OF LEFT RADIOCEPHALIC ARTERIOVENOUS  FISTULA;  Surgeon: Fransisco Hertz, MD;  Location: [facility] OR;  Service: Vascular;  Laterality: Left;   PERIPHERAL VASCULAR INTERVENTION Left 10/16/2022   Procedure: PERIPHERAL VASCULAR INTERVENTION;  Surgeon: Victorino Sparrow, MD;  Location: [facility] INVASIVE CV LAB;  Service: Cardiovascular;  Laterality: Left;  left AV fistula   REVISON OF ARTERIOVENOUS FISTULA Left 01/18/2015   Procedure: REVISON OF LEFT RADIOCEPHALIC ARTERIOVENOUS FISTULA USING VASCU-GUARD PERIPHERAL VASCULAR PATCH ;  Surgeon: Pryor Ochoa, MD;  Location: [facility] OR;  Service: Vascular;  Laterality: Left;   TONSILLECTOMY      Family History  Problem Relation Age of Onset   Diabetes Mother    Diabetes Father    Diabetes Brother    Hypertension Sister     Social History Reviewed with no changes to be made today.   Outpatient Medications Prior to Visit  Medication Sig Dispense Refill   amLODipine (NORVASC) 10 MG tablet Take 1 tablet (10 mg total) by mouth daily. para la presi n arterial. 90 tablet 1   atorvastatin (LIPITOR) 10 MG tablet Take 1 tablet (10 mg total) by mouth daily. para el colesterol 90 tablet 1   Blood Glucose Monitoring Suppl (TRUE  METRIX METER) w/Device KIT Use as instructed. Check blood glucose level by fingerstick twice per day. 1 kit 0   calcium acetate (PHOSLO) 667 MG capsule Take 1 capsule (667 mg total) by mouth 3 (three) times daily with meals. 90 capsule 0   carvedilol (COREG) 12.5 MG tablet Take 1 tablet (12.5 mg total) by mouth 2 (two) times daily with a meal. 60 tablet 3   cinacalcet (SENSIPAR) 30 MG tablet Take 1 tablet by mouth once a day Take with largest meal 90 tablet 3   cinacalcet (SENSIPAR) 60 MG tablet Take 60 mg by mouth daily with lunch.     ferric citrate (AURYXIA) 1 GM 210 MG(Fe) tablet Take 420 mg by mouth 2 (two) times daily with a meal.     glucose blood (TRUE METRIX BLOOD GLUCOSE TEST) test strip Use as instructed. Check blood glucose level by fingerstick twice per day. 100 each 12   Insulin Lispro Prot & Lispro (HUMALOG MIX 75/25 KWIKPEN) (75-25) 100 UNIT/ML Kwikpen Inject  15 Units into the skin in the morning and at bedtime. 45 mL 1   Insulin Pen Needle (B-D UF III MINI PEN NEEDLES) 31G X 5 MM MISC Use as instructed. Inject into the skin once daily 100 each 3   JANUVIA 25 MG tablet Take 1 tablet (25 mg total) by mouth daily. 90 tablet 0   losartan (COZAAR) 25 MG tablet Take 1 tablet (25 mg total) by mouth daily. para la presi n arterial 90 tablet 1   sodium zirconium cyclosilicate (LOKELMA) 5 g packet Take 1 packet by mouth three times a week Mix powder with 45ml water and drink. Please take three times weekly 13 packet 2   sodium zirconium cyclosilicate (LOKELMA) 5 g packet Take 1 packet by mouth twice a week Mix powder with 45ml water and drink. Please take three times weekly 9 packet 2   TRUEplus Lancets 28G MISC Use as instructed. Check blood glucose level by fingerstick twice per day 100 each 3   gabapentin (NEURONTIN) 100 MG capsule Take 1 capsule (100 mg total) by mouth at bedtime. 90 capsule 3   Facility-Administered Medications Prior to Visit  Medication Dose Route Frequency Provider  Last Rate Last Admin   0.9 %  sodium chloride infusion  250 mL Intravenous PRN Victorino Sparrow, MD       sodium chloride flush (NS) 0.9 % injection 3 mL  3 mL Intravenous Q12H Victorino Sparrow, MD        Allergies  Allergen Reactions   Metformin And Related Other (See Comments)    Metformin contraindication in ESRD       Objective:    BP 133/68 (BP Location: Left Arm, Patient Position: Sitting, Cuff Size: Normal)   Pulse 79   Ht 5\' 3"  (1.6 m)   Wt 147 lb 9.6 oz (67 kg)   SpO2 97%   BMI 26.15 kg/m  Wt Readings from Last 3 Encounters:  02/04/23 147 lb 9.6 oz (67 kg)  11/05/22 147 lb (66.7 kg)  10/16/22 145 lb 4.5 oz (65.9 kg)    Physical Exam Vitals and nursing note reviewed.  Constitutional:      Appearance: He is well-developed.  HENT:     Head: Normocephalic and atraumatic.  Cardiovascular:     Rate and Rhythm: Normal rate and regular rhythm.     Heart sounds: Normal heart sounds. No murmur heard.    No friction rub. No gallop.  Pulmonary:     Effort: Pulmonary effort is normal. No tachypnea or respiratory distress.     Breath sounds: Normal breath sounds. No decreased breath sounds, wheezing, rhonchi or rales.  Chest:     Chest wall: No tenderness.  Abdominal:     General: Bowel sounds are normal.     Palpations: Abdomen is soft.  Musculoskeletal:        General: Normal range of motion.     Cervical back: Normal range of motion.  Skin:    General: Skin is warm and dry.  Neurological:     Mental Status: He is alert and oriented to person, place, and time.     Coordination: Coordination normal.  Psychiatric:        Behavior: Behavior normal. Behavior is cooperative.        Thought Content: Thought content normal.        Judgment: Judgment normal.          Patient has been counseled extensively about nutrition and exercise as well as  the importance of adherence with medications and regular follow-up. The patient was given clear instructions to go to ER  or return to medical center if symptoms don't improve, worsen or new problems develop. The patient verbalized understanding.   Follow-up: Return in 3 months (on 05/07/2023).   Claiborne Rigg, FNP-BC [facility] [place], [place] [phone]   02/04/2023, 2:40 PM

## 2023-02-05 ENCOUNTER — Other Ambulatory Visit: Payer: Self-pay

## 2023-02-05 ENCOUNTER — Encounter (HOSPITAL_COMMUNITY): Payer: Self-pay | Admitting: *Deleted

## 2023-02-05 ENCOUNTER — Other Ambulatory Visit: Payer: Self-pay | Admitting: Nurse Practitioner

## 2023-02-05 DIAGNOSIS — E1169 Type 2 diabetes mellitus with other specified complication: Secondary | ICD-10-CM

## 2023-02-05 DIAGNOSIS — I152 Hypertension secondary to endocrine disorders: Secondary | ICD-10-CM

## 2023-02-05 LAB — LIPID PANEL
Chol/HDL Ratio: 5.2 ratio — ABNORMAL HIGH (ref 0.0–5.0)
Cholesterol, Total: 135 mg/dL (ref 100–199)
HDL: 26 mg/dL — ABNORMAL LOW (ref >39)
LDL Chol Calc (NIH): 67 mg/dL (ref 0–99)
Triglycerides: 261 mg/dL — ABNORMAL HIGH (ref 0–149)
VLDL Cholesterol Cal: 42 mg/dL — ABNORMAL HIGH (ref 5–40)

## 2023-02-05 LAB — THYROID PANEL WITH TSH
Free Thyroxine Index: 2.2 (ref 1.2–4.9)
T3 Uptake Ratio: 31 % (ref 24–39)
T4, Total: 7.1 ug/dL (ref 4.5–12.0)
TSH: 1.48 u[IU]/mL (ref 0.450–4.500)

## 2023-02-05 MED ORDER — ATORVASTATIN CALCIUM 20 MG PO TABS
20.0000 mg | ORAL_TABLET | Freq: Every day | ORAL | 1 refills | Status: DC
Start: 2023-02-05 — End: 2023-11-10
  Filled 2023-02-05 – 2023-04-30 (×2): qty 90, 90d supply, fill #0
  Filled 2023-06-23 – 2023-08-05 (×2): qty 90, 90d supply, fill #1

## 2023-02-07 ENCOUNTER — Other Ambulatory Visit: Payer: Self-pay

## 2023-02-08 LAB — FECAL OCCULT BLOOD, IMMUNOCHEMICAL: Fecal Occult Bld: NEGATIVE

## 2023-02-12 ENCOUNTER — Other Ambulatory Visit: Payer: Self-pay

## 2023-03-21 ENCOUNTER — Telehealth (HOSPITAL_COMMUNITY): Payer: Self-pay | Admitting: *Deleted

## 2023-03-21 NOTE — Telephone Encounter (Signed)
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 Will give to [facility].

## 2023-03-27 ENCOUNTER — Telehealth: Payer: Self-pay

## 2023-03-27 NOTE — Telephone Encounter (Signed)
Second attempt to reach Dr. Doristine Church office (referring provider) to determine if they are wanting pt to get scheduled for a fistulogram or office visit.

## 2023-03-28 ENCOUNTER — Telehealth: Payer: Self-pay

## 2023-03-28 NOTE — Telephone Encounter (Signed)
Faxed received from Dr. Thedore Mins office for pt to get scheduled for fistulogram. Pt's "friend Brian Fuller" answered the call and stated that he was not with pt. Interpretor gave [place] our contact info for pt to return our call to schedule procedure.

## 2023-04-14 ENCOUNTER — Other Ambulatory Visit: Payer: Self-pay

## 2023-04-14 DIAGNOSIS — Z992 Dependence on renal dialysis: Secondary | ICD-10-CM

## 2023-04-14 MED ORDER — SODIUM CHLORIDE 0.9 % IV SOLN: 250.0000 mL | INTRAVENOUS | Status: AC | PRN

## 2023-04-14 MED ORDER — SODIUM CHLORIDE 0.9% FLUSH: 3.0000 mL | Freq: Two times a day (BID) | INTRAVENOUS | Status: AC

## 2023-04-14 MED ORDER — SODIUM CHLORIDE 0.9% FLUSH: 3.0000 mL | INTRAVENOUS | Status: AC | PRN

## 2023-04-16 ENCOUNTER — Encounter (HOSPITAL_COMMUNITY)
Admission: RE | Disposition: A | Discharge status: Home / Self Care | Payer: Self-pay | Source: Home / Self Care | Attending: Vascular Surgery

## 2023-04-16 ENCOUNTER — Other Ambulatory Visit: Payer: Self-pay

## 2023-04-16 ENCOUNTER — Ambulatory Visit (HOSPITAL_COMMUNITY)
Admission: RE | Admit: 2023-04-16 | Discharge: 2023-04-16 | Disposition: A | Discharge status: Home / Self Care | Payer: Self-pay | Attending: Vascular Surgery | Admitting: Vascular Surgery

## 2023-04-16 DIAGNOSIS — Z794 Long term (current) use of insulin: Secondary | ICD-10-CM | POA: Insufficient documentation

## 2023-04-16 DIAGNOSIS — Y841 Kidney dialysis as the cause of abnormal reaction of the patient, or of later complication, without mention of misadventure at the time of the procedure: Secondary | ICD-10-CM | POA: Insufficient documentation

## 2023-04-16 DIAGNOSIS — I120 Hypertensive chronic kidney disease with stage 5 chronic kidney disease or end stage renal disease: Secondary | ICD-10-CM | POA: Insufficient documentation

## 2023-04-16 DIAGNOSIS — Z87891 Personal history of nicotine dependence: Secondary | ICD-10-CM | POA: Insufficient documentation

## 2023-04-16 DIAGNOSIS — Z992 Dependence on renal dialysis: Secondary | ICD-10-CM | POA: Insufficient documentation

## 2023-04-16 DIAGNOSIS — E1122 Type 2 diabetes mellitus with diabetic chronic kidney disease: Secondary | ICD-10-CM | POA: Insufficient documentation

## 2023-04-16 DIAGNOSIS — T82898A Other specified complication of vascular prosthetic devices, implants and grafts, initial encounter: Secondary | ICD-10-CM

## 2023-04-16 DIAGNOSIS — T82858A Stenosis of vascular prosthetic devices, implants and grafts, initial encounter: Secondary | ICD-10-CM | POA: Insufficient documentation

## 2023-04-16 DIAGNOSIS — N186 End stage renal disease: Secondary | ICD-10-CM | POA: Insufficient documentation

## 2023-04-16 HISTORY — PX: PERIPHERAL VASCULAR BALLOON ANGIOPLASTY: CATH118281

## 2023-04-16 HISTORY — PX: A/V FISTULAGRAM: CATH118298

## 2023-04-16 LAB — POCT I-STAT, CHEM 8
BUN: 40 mg/dL — ABNORMAL HIGH (ref 8–23)
Calcium, Ion: 1.09 mmol/L — ABNORMAL LOW (ref 1.15–1.40)
Chloride: 93 mmol/L — ABNORMAL LOW (ref 98–111)
Creatinine, Ser: 8 mg/dL — ABNORMAL HIGH (ref 0.61–1.24)
Glucose, Bld: 76 mg/dL (ref 70–99)
HCT: 32 % — ABNORMAL LOW (ref 39.0–52.0)
Hemoglobin: 10.9 g/dL — ABNORMAL LOW (ref 13.0–17.0)
Potassium: 4.8 mmol/L (ref 3.5–5.1)
Sodium: 135 mmol/L (ref 135–145)
TCO2: 32 mmol/L (ref 22–32)

## 2023-04-16 SURGERY — A/V FISTULAGRAM
Anesthesia: LOCAL | Laterality: Left

## 2023-04-16 MED ORDER — LIDOCAINE HCL (PF) 1 % IJ SOLN
INTRAMUSCULAR | Status: AC
  Filled 2023-04-16: qty 30

## 2023-04-16 MED ORDER — LIDOCAINE HCL (PF) 1 % IJ SOLN
INTRAMUSCULAR | Status: DC | PRN
  Administered 2023-04-16: 2 mL

## 2023-04-16 MED ORDER — HEPARIN (PORCINE) IN NACL 1000-0.9 UT/500ML-% IV SOLN
INTRAVENOUS | Status: DC | PRN
  Administered 2023-04-16: 500 mL

## 2023-04-16 MED ORDER — IODIXANOL 320 MG/ML IV SOLN
INTRAVENOUS | Status: DC | PRN
  Administered 2023-04-16: 65 mL via INTRAVENOUS

## 2023-04-16 SURGICAL SUPPLY — 17 items
BALLN LUTONIX AV 8X40X75 (BALLOONS) ×2
BALLN LUTONIX AV 8X60X75 (BALLOONS) ×2
BALLN MUSTANG 6X60X75 (BALLOONS) ×2
BALLOON LUTONIX AV 8X40X75 (BALLOONS) IMPLANT
BALLOON LUTONIX AV 8X60X75 (BALLOONS) IMPLANT
BALLOON MUSTANG 6X60X75 (BALLOONS) IMPLANT
CATH ANGIO 5F BER2 65CM (CATHETERS) IMPLANT
COVER DOME SNAP 22 D (MISCELLANEOUS) ×2 IMPLANT
KIT ENCORE 26 ADVANTAGE (KITS) IMPLANT
KIT MICROPUNCTURE NIT STIFF (SHEATH) IMPLANT
SET ATX-X65L (MISCELLANEOUS) IMPLANT
SHEATH PINNACLE R/O II 6F 4CM (SHEATH) IMPLANT
SHEATH PROBE COVER 6X72 (BAG) ×2 IMPLANT
STOPCOCK MORSE 400PSI 3WAY (MISCELLANEOUS) ×2 IMPLANT
TRAY PV CATH (CUSTOM PROCEDURE TRAY) ×2 IMPLANT
TUBING CIL FLEX 10 FLL-RA (TUBING) ×2 IMPLANT
WIRE BENTSON .035X145CM (WIRE) IMPLANT

## 2023-04-16 NOTE — Op Note (Signed)
    Patient name: Brian Fuller MRN: [id number] DOB: [birth year] Sex: male  04/16/2023 Pre-operative Diagnosis: Left arm brachiocephalic fistula malfunction Post-operative diagnosis:  Same Surgeon:  Victorino Sparrow, MD Procedure Performed: 1.  Ultrasound-guided micropuncture access of the left radiocephalic fistula 2.  Fistulogram 3.  Balloon angioplasty 8 x 60, 8 x 40 cephalic vein confluence, mid humerus cephalic vein 4.  Contrast volume 65 mL 5.  Venotomy managed with Monocryl suture  Indications: Patient is a 72 year old male with history of left-sided brachiocephalic fistula creation.  He has had increased bleeding times status post decannulation at dialysis concerning for central stenosis.  After discussing the risk and benefits of left arm brachiocephalic fistulogram, with possible intervention to improve flow and decrease post-dialysis bleeding, [name] elected to proceed  Findings:  Patent left brachiocephalic fistula with 2 tandem lesions with greater than 80% stenosis immediately peripheral to the cephalic vein, axillary vein confluence.  Mid humeral 70% stenosis. Widely patent arterial anastomosis   Procedure:  The patient was identified in the holding area and taken to room 8.  The patient was then placed supine on the table and prepped and draped in the usual sterile fashion.  A time out was called.  Ultrasound was used to evaluate the left arm brachiocephalic fistula.  The patient was accessed using ultrasound and a micropuncture needle.  Fistulogram followed.  See results above.  I elected to intervene on the flow-limiting lesions.  Initially, a 6 x 60 mm balloon was brought to the field and expanded along the most central portion of the cephalic vein with a nice result.  I elected to an 8 x 6 mm drug-coated balloon which was inflated for 3 minutes across the 2, tandem lesions.  Follow-up venography demonstrated an excellent result with resolution of stenosis less than 30%.   Next, attention turned to the mid humerus where there was 70% stenosis immediately following the 2 aneurysmal portions.  Initially, a 6 x 60 mm balloon was used with improvement, therefore I elected to move to an 8 x 40 mm drug-coated balloon.  This was inflated for 3 minutes with follow-up angiography demonstrated excellent result with resolution of the stenotic lesion.    Impression: Successful drug-coated balloon angioplasty 8 x 60, 8 x 40 of 3 lesions spanning from the central portion of the cephalic vein to the mid humerus. Patient can continue to use the left arm fistula for dialysis.   [name], MD [facility] Office: [phone]

## 2023-04-16 NOTE — H&P (Signed)
H&P    History of Present Illness: This is a 72 y.o. male with end-stage renal disease and history of left-sided brachiocephalic fistula.  This fistula has had some malfunction with prolonged bleeding after dialysis.  Denies symptoms of steal syndrome. Denies left arm asymmetry.  Past Medical History:  Diagnosis Date   Anemia    Chronic kidney disease    dialysis, T/Th/Sat   Diabetes mellitus without complication (HCC)    Diabetic retinopathy (HCC)    ESRD (end stage renal disease) (HCC)    Glaucoma    Hypertension    Hyperthyroidism    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    Pneumonia    Systolic murmur     Past Surgical History:  Procedure Laterality Date   A/V FISTULAGRAM Left 10/16/2022   Procedure: A/V Fistulagram;  Surgeon: Victorino Sparrow, MD;  Location: [facility] INVASIVE CV LAB;  Service: Cardiovascular;  Laterality: Left;   AMPUTATION TOE Left 09/05/2021   Procedure: AMPUTATION Left Second Toe;  Surgeon: Joen Laura, MD;  Location: [facility] OR;  Service: Orthopedics;  Laterality: Left;   AV FISTULA PLACEMENT Left 11/02/2014   Procedure: ARTERIOVENOUS (AV) FISTULA CREATION;  Surgeon: Pryor Ochoa, MD;  Location: [facility] OR;  Service: Vascular;  Laterality: Left;   AV FISTULA PLACEMENT Left 04/26/2015   Procedure: LEFT BRACHIOCEPHALIC ARTERIOVENOUS (AV) FISTULA CREATION;  Surgeon: Fransisco Hertz, MD;  Location: [facility] OR;  Service: Vascular;  Laterality: Left;   EYE SURGERY     INSERTION OF DIALYSIS CATHETER Right 11/04/2014   Procedure: INSERTION OF DIALYSIS CATHETER RIGHT INTERNAL JUGULAR VEIN;  Surgeon: Pryor Ochoa, MD;  Location: [facility] OR;  Service: Vascular;  Laterality: Right;   LIGATION OF ARTERIOVENOUS  FISTULA Left 04/26/2015   Procedure: LIGATION OF LEFT RADIOCEPHALIC ARTERIOVENOUS  FISTULA;  Surgeon: Fransisco Hertz, MD;  Location: [facility] OR;  Service: Vascular;  Laterality: Left;   PERIPHERAL VASCULAR INTERVENTION Left 10/16/2022   Procedure: PERIPHERAL VASCULAR INTERVENTION;   Surgeon: Victorino Sparrow, MD;  Location: [facility] INVASIVE CV LAB;  Service: Cardiovascular;  Laterality: Left;  left AV fistula   REVISON OF ARTERIOVENOUS FISTULA Left 01/18/2015   Procedure: REVISON OF LEFT RADIOCEPHALIC ARTERIOVENOUS FISTULA USING VASCU-GUARD PERIPHERAL VASCULAR PATCH ;  Surgeon: Pryor Ochoa, MD;  Location: [facility] OR;  Service: Vascular;  Laterality: Left;   TONSILLECTOMY      Allergies  Allergen Reactions   Metformin And Related Other (See Comments)    Metformin contraindication in ESRD    Prior to Admission medications   Medication Sig Start Date End Date Taking? Authorizing Provider  amLODipine (NORVASC) 10 MG tablet Take 1 tablet (10 mg total) by mouth daily. para la presi n arterial. 01/09/23  Yes Newlin, Enobong, MD  atorvastatin (LIPITOR) 20 MG tablet Take 1 tablet (20 mg total) by mouth daily. 02/05/23  Yes Claiborne Rigg, NP  calcium acetate (PHOSLO) 667 MG capsule Take 1 capsule (667 mg total) by mouth 3 (three) times daily with meals. 11/10/14  Yes Osvaldo Shipper, MD  carvedilol (COREG) 12.5 MG tablet Take 1 tablet (12.5 mg total) by mouth 2 (two) times daily with a meal. 12/06/22  Yes Hoy Register, MD  cinacalcet (SENSIPAR) 30 MG tablet Take 1 tablet by mouth once a day Take with largest meal 11/05/22  Yes   cinacalcet (SENSIPAR) 60 MG tablet Take 60 mg by mouth daily with lunch.   Yes [provider]  ferric citrate (AURYXIA) 1 GM 210 MG(Fe)  tablet Take 420 mg by mouth 2 (two) times daily with a meal.   Yes [provider]  gabapentin (NEURONTIN) 100 MG capsule Take 1 capsule (100 mg total) by mouth at bedtime. For neuropathy 02/04/23  Yes Claiborne Rigg, NP  glucose blood (TRUE METRIX BLOOD GLUCOSE TEST) test strip Use as instructed. Check blood glucose level by fingerstick twice per day. 12/25/21  Yes Claiborne Rigg, NP  Insulin Lispro Prot & Lispro (HUMALOG MIX 75/25 KWIKPEN) (75-25) 100 UNIT/ML Kwikpen Inject 15 Units into the skin in the  morning and at bedtime. 11/05/22  Yes Claiborne Rigg, NP  Insulin Pen Needle (B-D UF III MINI PEN NEEDLES) 31G X 5 MM MISC Use as instructed. Inject into the skin once daily 09/13/22  Yes Claiborne Rigg, NP  JANUVIA 25 MG tablet Take 1 tablet (25 mg total) by mouth daily. 01/17/23  Yes Hoy Register, MD  losartan (COZAAR) 25 MG tablet Take 1 tablet (25 mg total) by mouth daily. para la presi n arterial 01/09/23  Yes Newlin, Odette Horns, MD  sodium zirconium cyclosilicate (LOKELMA) 5 g packet Take 1 packet by mouth three times a week Mix powder with 45ml water and drink. Please take three times weekly 04/16/22  Yes   sodium zirconium cyclosilicate (LOKELMA) 5 g packet Take 1 packet by mouth twice a week Mix powder with 45ml water and drink. Please take three times weekly 11/07/22  Yes   TRUEplus Lancets 28G MISC Use as instructed. Check blood glucose level by fingerstick twice per day 12/25/21  Yes Claiborne Rigg, NP  Blood Glucose Monitoring Suppl (TRUE METRIX METER) w/Device KIT Use as instructed. Check blood glucose level by fingerstick twice per day. 12/25/21   Claiborne Rigg, NP    Social History   Socioeconomic History   Marital status: Married    Spouse name: Not on file   Number of children: Not on file   Years of education: Not on file   Highest education level: Not on file  Occupational History   Not on file  Tobacco Use   Smoking status: Former    Current packs/day: 0.00    Types: Cigarettes    Start date: 01/16/1958    Quit date: 01/17/1984    Years since quitting: 39.2    Passive exposure: Never   Smokeless tobacco: Never  Vaping Use   Vaping status: Never Used  Substance and Sexual Activity   Alcohol use: No    Alcohol/week: 0.0 standard drinks of alcohol   Drug use: No   Sexual activity: Not on file  Other Topics Concern   Not on file  Social History Narrative   Not on file   Social Determinants of Health   Financial Resource Strain: Low Risk  (12/06/2022)   Overall  Financial Resource Strain (CARDIA)    Difficulty of Paying Living Expenses: Not very hard  Food Insecurity: No Food Insecurity (12/06/2022)   Hunger Vital Sign    Worried About Running Out of Food in the Last Year: Never true    Ran Out of Food in the Last Year: Never true  Transportation Needs: No Transportation Needs (12/06/2022)   PRAPARE - Administrator, Civil Service (Medical): No    Lack of Transportation (Non-Medical): No  Physical Activity: Inactive (12/06/2022)   Exercise Vital Sign    Days of Exercise per Week: 0 days    Minutes of Exercise per Session: 0 min  Stress: No Stress Concern  Present (12/06/2022)   [employer] of Occupational Health - Occupational Stress Questionnaire    Feeling of Stress : Only a little  Social Connections: Moderately Integrated (12/06/2022)   Social Connection and Isolation Panel [NHANES]    Frequency of Communication with Friends and Family: More than three times a week    Frequency of Social Gatherings with Friends and Family: More than three times a week    Attends Religious Services: 1 to 4 times per year    Active Member of [employer] or Organizations: No    Attends Banker Meetings: Never    Marital Status: Married  Catering manager Violence: Not At Risk (12/06/2022)   Humiliation, Afraid, Rape, and Kick questionnaire    Fear of Current or Ex-Partner: No    Emotionally Abused: No    Physically Abused: No    Sexually Abused: No   Family History  Problem Relation Age of Onset   Diabetes Mother    Diabetes Father    Diabetes Brother    Hypertension Sister     ROS: Otherwise negative unless mentioned in HPI  Physical Examination  Vitals:   04/16/23 0802  BP: (!) 166/73  Pulse: 78  Resp: 16  Temp: 97.9 F (36.6 C)  SpO2: 98%   Body mass index is 27.02 kg/m.  General:  WDWN in NAD Gait: Not observed HENT: WNL, normocephalic Pulmonary: normal non-labored breathing, without Rales, rhonchi,   wheezing Cardiac: regular Abdomen:  soft, NT/ND, no masses Skin: without rashes Vascular Exam/Pulses: Pulsating left arm brachiocephalic fistula Extremities: without ischemic changes Musculoskeletal: no muscle wasting or atrophy  Neurologic: A&O X 3;  No focal weakness or paresthesias are detected; speech is fluent/normal Psychiatric:  The pt has Normal affect. Lymph:  Unremarkable  CBC    Component Value Date/Time   WBC 6.7 09/06/2021 0302   RBC 3.10 (L) 09/06/2021 0302   HGB 10.9 (L) 04/16/2023 0826   HCT 32.0 (L) 04/16/2023 0826   PLT 218 09/06/2021 0302   MCV 95.8 09/06/2021 0302   MCH 32.3 09/06/2021 0302   MCHC 33.7 09/06/2021 0302   RDW 13.0 09/06/2021 0302   LYMPHSABS 1.0 09/04/2021 1320   MONOABS 0.5 09/04/2021 1320   EOSABS 0.1 09/04/2021 1320   BASOSABS 0.0 09/04/2021 1320    BMET    Component Value Date/Time   NA 135 04/16/2023 0826   NA 135 11/19/2021 1022   K 4.8 04/16/2023 0826   CL 93 (L) 04/16/2023 0826   CO2 28 11/19/2021 1022   GLUCOSE 76 04/16/2023 0826   BUN 40 (H) 04/16/2023 0826   BUN 54 (H) 11/19/2021 1022   CREATININE 8.00 (H) 04/16/2023 0826   CREATININE 6.82 (H) 01/18/2016 1027   CALCIUM 8.3 (L) 11/19/2021 1022   GFRNONAA 7 (L) 09/06/2021 0302   GFRNONAA 8 (L) 01/18/2016 1027   GFRAA 9 (L) 01/18/2016 1027    COAGS: Lab Results  Component Value Date   INR 1.04 11/02/2014   INR 1.13 10/30/2014   INR 1.05 02/21/2014     ASSESSMENT/PLAN: This is a 72 y.o. male with end-stage renal disease currently dialyzed through a left-sided mixed valve fistula.  He has had prolonged bleeding at decannulation.  I discussed the risk benefits of left arm fistulogram in an effort to improve flow, Bezaleel elected to proceed.  Judging by previous fistulogram, right-handed likely has some central stenosis at the central portion of the cephalic vein/confluence into the axillary vein.   [name] MD MS  [facility] [phone] 04/16/2023  9:44 AM

## 2023-04-17 ENCOUNTER — Encounter (HOSPITAL_COMMUNITY): Payer: Self-pay | Admitting: Vascular Surgery

## 2023-04-28 ENCOUNTER — Telehealth: Payer: Self-pay

## 2023-04-28 ENCOUNTER — Other Ambulatory Visit: Payer: Self-pay

## 2023-04-28 NOTE — Telephone Encounter (Signed)
JANUVIA 25mg  patient assistance refill placed today with [facility]. Rx # [id number]. Medication will ship to patient's home address in the next 5-7 business days. 90 day supply.  [facility] [phone]

## 2023-04-30 ENCOUNTER — Encounter (HOSPITAL_COMMUNITY): Payer: Self-pay | Admitting: *Deleted

## 2023-04-30 ENCOUNTER — Other Ambulatory Visit: Payer: Self-pay

## 2023-05-01 ENCOUNTER — Other Ambulatory Visit: Payer: Self-pay

## 2023-05-07 ENCOUNTER — Other Ambulatory Visit: Payer: Self-pay

## 2023-05-07 ENCOUNTER — Ambulatory Visit: Payer: Self-pay | Attending: Nurse Practitioner | Admitting: Nurse Practitioner

## 2023-05-07 ENCOUNTER — Encounter: Payer: Self-pay | Admitting: Nurse Practitioner

## 2023-05-07 VITALS — BP 139/74 | HR 79 | Ht 62.0 in | Wt 150.4 lb

## 2023-05-07 DIAGNOSIS — Z992 Dependence on renal dialysis: Secondary | ICD-10-CM

## 2023-05-07 DIAGNOSIS — E1122 Type 2 diabetes mellitus with diabetic chronic kidney disease: Secondary | ICD-10-CM

## 2023-05-07 DIAGNOSIS — Z7984 Long term (current) use of oral hypoglycemic drugs: Secondary | ICD-10-CM

## 2023-05-07 DIAGNOSIS — E1159 Type 2 diabetes mellitus with other circulatory complications: Secondary | ICD-10-CM

## 2023-05-07 DIAGNOSIS — I152 Hypertension secondary to endocrine disorders: Secondary | ICD-10-CM

## 2023-05-07 DIAGNOSIS — N186 End stage renal disease: Secondary | ICD-10-CM

## 2023-05-07 LAB — POCT GLYCOSYLATED HEMOGLOBIN (HGB A1C): Hemoglobin A1C: 8.2 % — AB (ref 4.0–5.6)

## 2023-05-07 MED ORDER — AMLODIPINE BESYLATE 10 MG PO TABS
10.0000 mg | ORAL_TABLET | Freq: Every day | ORAL | 1 refills | Status: DC
  Filled 2023-05-07 – 2023-08-05 (×3): qty 90, 90d supply, fill #0
  Filled 2023-11-10 (×2): qty 90, 90d supply, fill #1

## 2023-05-07 MED ORDER — INSULIN LISPRO PROT & LISPRO (75-25 MIX) 100 UNIT/ML KWIKPEN
20.0000 [IU] | PEN_INJECTOR | Freq: Two times a day (BID) | SUBCUTANEOUS | 6 refills | Status: DC
  Filled 2023-05-07: qty 45, 113d supply, fill #0
  Filled 2023-06-23: qty 15, 38d supply, fill #0
  Filled 2023-08-05: qty 15, 38d supply, fill #1
  Filled 2023-09-29: qty 15, 38d supply, fill #2
  Filled 2023-11-10 (×2): qty 15, 38d supply, fill #3
  Filled 2024-02-24: qty 15, 38d supply, fill #4

## 2023-05-07 MED ORDER — CARVEDILOL 12.5 MG PO TABS
12.5000 mg | ORAL_TABLET | Freq: Two times a day (BID) | ORAL | 1 refills | Status: DC
Start: 2023-05-07 — End: 2023-11-10
  Filled 2023-05-07 – 2023-06-23 (×3): qty 180, 90d supply, fill #0
  Filled 2023-09-29: qty 180, 90d supply, fill #1

## 2023-05-07 NOTE — Progress Notes (Signed)
Assessment & Plan:  Brian Fuller was seen today for medical management of chronic issues.  Diagnoses and all orders for this visit:  Type 2 diabetes mellitus with chronic kidney disease on chronic dialysis, without long-term current use of insulin (HCC) -     POCT glycosylated hemoglobin (Hb A1C) -     CMP14+EGFR -     Insulin Lispro Prot & Lispro (HUMALOG MIX 75/25 KWIKPEN) (75-25) 100 UNIT/ML Kwikpen; Inject 20 Units into the skin in the morning and at bedtime.  Hypertension associated with type 2 diabetes mellitus (HCC) -     carvedilol (COREG) 12.5 MG tablet; Take 1 tablet (12.5 mg total) by mouth 2 (two) times daily with a meal. -     amLODipine (NORVASC) 10 MG tablet; Take 1 tablet (10 mg total) by mouth daily. para la presi n arterial.    Patient has been counseled on age-appropriate routine health concerns for screening and prevention. These are reviewed and up-to-date. Referrals have been placed accordingly. Immunizations are up-to-date or declined.    Subjective:   Chief Complaint  Patient presents with   Medical Management of Chronic Issues   [name] 72 y.o. male presents to office today for follow up to DM and HTN  He is accompanied by his neighbor who is translating for him.   He has a past medical history of Anemia, Chronic kidney disease, DM 2, Diabetic retinopathy, ESRD, Glaucoma, Hypertension, Hyperthyroidism, Loss of vision (08/20/2015), Pneumonia, and Systolic murmur   DM 2 Diabetes is not well controlled. His diet includes tortillas, rice, cheese, milk, cereal. He is taking Jardiance 25 mg daily and insulin 75/25 15 units BID as prescribed.  Lab Results  Component Value Date   HGBA1C 8.2 (A) 05/07/2023    Lab Results  Component Value Date   HGBA1C 7.5 (A) 02/04/2023    HTN Blood pressure is slightly above goal. He is taking amlodipine 10 mg daily, carvedilol 125 mg BID as prescribed.  BP Readings from Last 3 Encounters:  05/07/23  139/74  04/16/23 (!) 164/68  02/04/23 133/68    Review of Systems  Constitutional:  Negative for fever, malaise/fatigue and weight loss.  HENT: Negative.  Negative for nosebleeds.   Eyes: Negative.  Negative for blurred vision, double vision and photophobia.  Respiratory: Negative.  Negative for cough and shortness of breath.   Cardiovascular: Negative.  Negative for chest pain, palpitations and leg swelling.  Gastrointestinal: Negative.  Negative for heartburn, nausea and vomiting.  Musculoskeletal: Negative.  Negative for myalgias.  Neurological: Negative.  Negative for dizziness, focal weakness, seizures and headaches.  Psychiatric/Behavioral: Negative.  Negative for suicidal ideas.     Past Medical History:  Diagnosis Date   Anemia    Chronic kidney disease    dialysis, T/Th/Sat   Diabetes mellitus without complication (HCC)    Diabetic retinopathy (HCC)    ESRD (end stage renal disease) (HCC)    Glaucoma    Hypertension    Hyperthyroidism    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    Pneumonia    Systolic murmur     Past Surgical History:  Procedure Laterality Date   A/V FISTULAGRAM Left 10/16/2022   Procedure: A/V Fistulagram;  Surgeon: Victorino Sparrow, MD;  Location: [facility] INVASIVE CV LAB;  Service: Cardiovascular;  Laterality: Left;   A/V FISTULAGRAM Left 04/16/2023   Procedure: A/V Fistulagram;  Surgeon: Victorino Sparrow, MD;  Location: [facility] INVASIVE CV LAB;  Service: Cardiovascular;  Laterality:  Left;   AMPUTATION TOE Left 09/05/2021   Procedure: AMPUTATION Left Second Toe;  Surgeon: Joen Laura, MD;  Location: [facility] OR;  Service: Orthopedics;  Laterality: Left;   AV FISTULA PLACEMENT Left 11/02/2014   Procedure: ARTERIOVENOUS (AV) FISTULA CREATION;  Surgeon: Pryor Ochoa, MD;  Location: [facility] OR;  Service: Vascular;  Laterality: Left;   AV FISTULA PLACEMENT Left 04/26/2015   Procedure: LEFT BRACHIOCEPHALIC ARTERIOVENOUS (AV) FISTULA CREATION;  Surgeon: Fransisco Hertz, MD;   Location: [facility] OR;  Service: Vascular;  Laterality: Left;   EYE SURGERY     INSERTION OF DIALYSIS CATHETER Right 11/04/2014   Procedure: INSERTION OF DIALYSIS CATHETER RIGHT INTERNAL JUGULAR VEIN;  Surgeon: Pryor Ochoa, MD;  Location: [facility] OR;  Service: Vascular;  Laterality: Right;   LIGATION OF ARTERIOVENOUS  FISTULA Left 04/26/2015   Procedure: LIGATION OF LEFT RADIOCEPHALIC ARTERIOVENOUS  FISTULA;  Surgeon: Fransisco Hertz, MD;  Location: [facility] OR;  Service: Vascular;  Laterality: Left;   PERIPHERAL VASCULAR BALLOON ANGIOPLASTY  04/16/2023   Procedure: PERIPHERAL VASCULAR BALLOON ANGIOPLASTY;  Surgeon: Victorino Sparrow, MD;  [facility];  Service: Cardiovascular;;   PERIPHERAL VASCULAR INTERVENTION Left 10/16/2022   Procedure: PERIPHERAL VASCULAR INTERVENTION;  Surgeon: Victorino Sparrow, MD;  Location: [facility] INVASIVE CV LAB;  Service: Cardiovascular;  Laterality: Left;  left AV fistula   REVISON OF ARTERIOVENOUS FISTULA Left 01/18/2015   Procedure: REVISON OF LEFT RADIOCEPHALIC ARTERIOVENOUS FISTULA USING VASCU-GUARD PERIPHERAL VASCULAR PATCH ;  Surgeon: Pryor Ochoa, MD;  Location: [facility] OR;  Service: Vascular;  Laterality: Left;   TONSILLECTOMY      Family History  Problem Relation Age of Onset   Diabetes Mother    Diabetes Father    Diabetes Brother    Hypertension Sister     Social History Reviewed with no changes to be made today.   Outpatient Medications Prior to Visit  Medication Sig Dispense Refill   atorvastatin (LIPITOR) 20 MG tablet Take 1 tablet (20 mg total) by mouth daily. 90 tablet 1   Blood Glucose Monitoring Suppl (TRUE METRIX METER) w/Device KIT Use as instructed. Check blood glucose level by fingerstick twice per day. 1 kit 0   calcium acetate (PHOSLO) 667 MG capsule Take 1 capsule (667 mg total) by mouth 3 (three) times daily with meals. 90 capsule 0   cinacalcet (SENSIPAR) 30 MG tablet Take 1 tablet by mouth once a day Take with largest meal 90 tablet 3    cinacalcet (SENSIPAR) 60 MG tablet Take 60 mg by mouth daily with lunch.     ferric citrate (AURYXIA) 1 GM 210 MG(Fe) tablet Take 420 mg by mouth 2 (two) times daily with a meal.     gabapentin (NEURONTIN) 100 MG capsule Take 1 capsule (100 mg total) by mouth at bedtime. For neuropathy 90 capsule 3   glucose blood (TRUE METRIX BLOOD GLUCOSE TEST) test strip Use as instructed. Check blood glucose level by fingerstick twice per day. 100 each 12   Insulin Pen Needle (B-D UF III MINI PEN NEEDLES) 31G X 5 MM MISC Use as instructed. Inject into the skin once daily 100 each 3   JANUVIA 25 MG tablet Take 1 tablet (25 mg total) by mouth daily. 90 tablet 0   losartan (COZAAR) 25 MG tablet Take 1 tablet (25 mg total) by mouth daily. para la presi n arterial 90 tablet 1   sodium zirconium cyclosilicate (LOKELMA) 5 g packet Take 1 packet by mouth  three times a week Mix powder with 45ml water and drink. Please take three times weekly 13 packet 2   sodium zirconium cyclosilicate (LOKELMA) 5 g packet Take 1 packet by mouth twice a week Mix powder with 45ml water and drink. Please take three times weekly 9 packet 2   TRUEplus Lancets 28G MISC Use as instructed. Check blood glucose level by fingerstick twice per day 100 each 3   amLODipine (NORVASC) 10 MG tablet Take 1 tablet (10 mg total) by mouth daily. para la presi n arterial. 90 tablet 1   carvedilol (COREG) 12.5 MG tablet Take 1 tablet (12.5 mg total) by mouth 2 (two) times daily with a meal. 60 tablet 3   Insulin Lispro Prot & Lispro (HUMALOG MIX 75/25 KWIKPEN) (75-25) 100 UNIT/ML Kwikpen Inject 15 Units into the skin in the morning and at bedtime. 45 mL 1   Facility-Administered Medications Prior to Visit  Medication Dose Route Frequency Provider Last Rate Last Admin   0.9 %  sodium chloride infusion  250 mL Intravenous PRN Victorino Sparrow, MD       0.9 %  sodium chloride infusion  250 mL Intravenous PRN Victorino Sparrow, MD       sodium chloride flush (NS)  0.9 % injection 3 mL  3 mL Intravenous Q12H Victorino Sparrow, MD       sodium chloride flush (NS) 0.9 % injection 3 mL  3 mL Intravenous Q12H Victorino Sparrow, MD       sodium chloride flush (NS) 0.9 % injection 3 mL  3 mL Intravenous PRN Victorino Sparrow, MD        Allergies  Allergen Reactions   Metformin And Related Other (See Comments)    Metformin contraindication in ESRD       Objective:    BP 139/74 (BP Location: Left Arm, Patient Position: Sitting, Cuff Size: Normal)   Pulse 79   Ht 5\' 2"  (1.575 m)   Wt 150 lb 6.4 oz (68.2 kg)   SpO2 97%   BMI 27.51 kg/m  Wt Readings from Last 3 Encounters:  05/07/23 150 lb 6.4 oz (68.2 kg)  04/16/23 147 lb 11.3 oz (67 kg)  02/04/23 147 lb 9.6 oz (67 kg)    Physical Exam Vitals and nursing note reviewed.  Constitutional:      Appearance: He is well-developed.  HENT:     Head: Normocephalic and atraumatic.  Cardiovascular:     Rate and Rhythm: Normal rate and regular rhythm.     Heart sounds: Normal heart sounds. No murmur heard.    No friction rub. No gallop.  Pulmonary:     Effort: Pulmonary effort is normal. No tachypnea or respiratory distress.     Breath sounds: Normal breath sounds. No decreased breath sounds, wheezing, rhonchi or rales.  Chest:     Chest wall: No tenderness.  Abdominal:     General: Bowel sounds are normal.     Palpations: Abdomen is soft.  Musculoskeletal:        General: Normal range of motion.       Arms:     Cervical back: Normal range of motion.  Skin:    General: Skin is warm and dry.  Neurological:     Mental Status: He is alert and oriented to person, place, and time.     Coordination: Coordination normal.  Psychiatric:        Behavior: Behavior normal. Behavior is cooperative.  Thought Content: Thought content normal.        Judgment: Judgment normal.          Patient has been counseled extensively about nutrition and exercise as well as the importance of adherence with  medications and regular follow-up. The patient was given clear instructions to go to ER or return to medical center if symptoms don't improve, worsen or new problems develop. The patient verbalized understanding.   Follow-up: Return in about 3 months (around 08/06/2023).   Claiborne Rigg, FNP-BC [facility] [place], [place] [phone]   05/07/2023, 2:45 PM

## 2023-05-08 LAB — CMP14+EGFR
ALT: 13 IU/L (ref 0–44)
AST: 22 IU/L (ref 0–40)
Albumin: 4.3 g/dL (ref 3.8–4.8)
Alkaline Phosphatase: 121 IU/L (ref 44–121)
BUN/Creatinine Ratio: 7 — ABNORMAL LOW (ref 10–24)
BUN: 45 mg/dL — ABNORMAL HIGH (ref 8–27)
Bilirubin Total: 0.2 mg/dL (ref 0.0–1.2)
CO2: 27 mmol/L (ref 20–29)
Calcium: 10.5 mg/dL — ABNORMAL HIGH (ref 8.6–10.2)
Chloride: 88 mmol/L — ABNORMAL LOW (ref 96–106)
Creatinine, Ser: 6.8 mg/dL — ABNORMAL HIGH (ref 0.76–1.27)
Globulin, Total: 3 g/dL (ref 1.5–4.5)
Glucose: 73 mg/dL (ref 70–99)
Potassium: 4.7 mmol/L (ref 3.5–5.2)
Sodium: 135 mmol/L (ref 134–144)
Total Protein: 7.3 g/dL (ref 6.0–8.5)
eGFR: 8 mL/min/{1.73_m2} — ABNORMAL LOW (ref >59)

## 2023-06-10 ENCOUNTER — Other Ambulatory Visit (HOSPITAL_COMMUNITY): Payer: Self-pay | Admitting: Nephrology

## 2023-06-10 DIAGNOSIS — N186 End stage renal disease: Secondary | ICD-10-CM

## 2023-06-17 ENCOUNTER — Other Ambulatory Visit (HOSPITAL_COMMUNITY): Payer: Self-pay | Admitting: Radiology

## 2023-06-17 DIAGNOSIS — N186 End stage renal disease: Secondary | ICD-10-CM

## 2023-06-18 ENCOUNTER — Other Ambulatory Visit (HOSPITAL_COMMUNITY): Payer: Self-pay | Admitting: Nephrology

## 2023-06-18 ENCOUNTER — Encounter (HOSPITAL_COMMUNITY): Payer: Self-pay

## 2023-06-18 ENCOUNTER — Ambulatory Visit (HOSPITAL_COMMUNITY)
Admission: RE | Admit: 2023-06-18 | Discharge: 2023-06-18 | Disposition: A | Discharge status: Home / Self Care | Payer: Self-pay | Source: Ambulatory Visit | Attending: Nephrology | Admitting: Nephrology

## 2023-06-18 ENCOUNTER — Other Ambulatory Visit: Payer: Self-pay

## 2023-06-18 DIAGNOSIS — N186 End stage renal disease: Secondary | ICD-10-CM

## 2023-06-18 DIAGNOSIS — Z992 Dependence on renal dialysis: Secondary | ICD-10-CM | POA: Insufficient documentation

## 2023-06-18 DIAGNOSIS — T82858A Stenosis of vascular prosthetic devices, implants and grafts, initial encounter: Secondary | ICD-10-CM | POA: Insufficient documentation

## 2023-06-18 DIAGNOSIS — I120 Hypertensive chronic kidney disease with stage 5 chronic kidney disease or end stage renal disease: Secondary | ICD-10-CM | POA: Insufficient documentation

## 2023-06-18 DIAGNOSIS — E1122 Type 2 diabetes mellitus with diabetic chronic kidney disease: Secondary | ICD-10-CM | POA: Insufficient documentation

## 2023-06-18 DIAGNOSIS — Z794 Long term (current) use of insulin: Secondary | ICD-10-CM | POA: Insufficient documentation

## 2023-06-18 DIAGNOSIS — Z87891 Personal history of nicotine dependence: Secondary | ICD-10-CM | POA: Insufficient documentation

## 2023-06-18 DIAGNOSIS — Y841 Kidney dialysis as the cause of abnormal reaction of the patient, or of later complication, without mention of misadventure at the time of the procedure: Secondary | ICD-10-CM | POA: Insufficient documentation

## 2023-06-18 HISTORY — PX: IR US GUIDE VASC ACCESS LEFT: IMG2389

## 2023-06-18 HISTORY — PX: IR AV DIALY SHUNT INTRO NEEDLE/INTRACATH INITIAL W/PTA/IMG LEFT: IMG6103

## 2023-06-18 LAB — GLUCOSE, CAPILLARY: Glucose-Capillary: 162 mg/dL — ABNORMAL HIGH (ref 70–99)

## 2023-06-18 MED ORDER — MIDAZOLAM HCL 2 MG/2ML IJ SOLN
INTRAMUSCULAR | Status: AC | PRN
Start: 2023-06-18 — End: 2023-06-18
  Administered 2023-06-18: 1 mg via INTRAVENOUS

## 2023-06-18 MED ORDER — SODIUM CHLORIDE 0.9 % IV SOLN: INTRAVENOUS | Status: DC

## 2023-06-18 MED ORDER — IOHEXOL 300 MG/ML  SOLN
100.0000 mL | Freq: Once | INTRAMUSCULAR | Status: AC | PRN
  Administered 2023-06-18: 30 mL via INTRAVENOUS

## 2023-06-18 MED ORDER — FENTANYL CITRATE (PF) 100 MCG/2ML IJ SOLN
INTRAMUSCULAR | Status: AC | PRN
  Administered 2023-06-18: 50 ug via INTRAVENOUS

## 2023-06-18 MED ORDER — HEPARIN SODIUM (PORCINE) 1000 UNIT/ML IJ SOLN
INTRAMUSCULAR | Status: AC
Start: 2023-06-18 — End: ?
  Filled 2023-06-18: qty 10

## 2023-06-18 MED ORDER — MIDAZOLAM HCL 2 MG/2ML IJ SOLN
INTRAMUSCULAR | Status: AC
  Filled 2023-06-18: qty 2

## 2023-06-18 MED ORDER — LIDOCAINE-EPINEPHRINE 1 %-1:100000 IJ SOLN
INTRAMUSCULAR | Status: AC
  Filled 2023-06-18: qty 1

## 2023-06-18 MED ORDER — HEPARIN SODIUM (PORCINE) 1000 UNIT/ML IJ SOLN
INTRAMUSCULAR | Status: AC | PRN
Start: 2023-06-18 — End: 2023-06-18
  Administered 2023-06-18: 3000 [IU] via INTRAVENOUS

## 2023-06-18 MED ORDER — FENTANYL CITRATE (PF) 100 MCG/2ML IJ SOLN
INTRAMUSCULAR | Status: AC
  Filled 2023-06-18: qty 2

## 2023-06-18 MED ORDER — LIDOCAINE-EPINEPHRINE (PF) 1 %-1:200000 IJ SOLN
20.0000 mL | Freq: Once | INTRAMUSCULAR | Status: AC
  Administered 2023-06-18: 20 mL

## 2023-06-18 NOTE — H&P (Signed)
Chief Complaint: Patient was seen in consultation today for left fistula evaluation/intervention at the request of Singh,Vikas  Referring Physician(s): Singh,Vikas  Supervising Physician: Roanna Banning  Patient Status: [facility] - Out-pt  History of Present Illness: Brian Fuller is a 72 y.o. male   FULL Code status per pt ESRD  Follows with Dr Romelle Starcher  Last use yesterday-- complete Noted persistent bleeding post treatment Large aneurysmal fistula  Last intervention 8/28:   Impression: Successful drug-coated balloon angioplasty 8 x 60, 8 x 40 of 3 lesions spanning from the central portion of the cephalic vein to the mid humerus. Patient can continue to use the left arm fistula for dialysis.   IR asked to evaluate and possibly intervene on fistula    Past Medical History:  Diagnosis Date   Anemia    Chronic kidney disease    dialysis, T/Th/Sat   Diabetes mellitus without complication (HCC)    Diabetic retinopathy (HCC)    ESRD (end stage renal disease) (HCC)    Glaucoma    Hypertension    Hyperthyroidism    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    Pneumonia    Systolic murmur     Past Surgical History:  Procedure Laterality Date   A/V FISTULAGRAM Left 10/16/2022   Procedure: A/V Fistulagram;  Surgeon: Victorino Sparrow, MD;  Location: [facility] INVASIVE CV LAB;  Service: Cardiovascular;  Laterality: Left;   A/V FISTULAGRAM Left 04/16/2023   Procedure: A/V Fistulagram;  Surgeon: Victorino Sparrow, MD;  Location: [facility] INVASIVE CV LAB;  Service: Cardiovascular;  Laterality: Left;   AMPUTATION TOE Left 09/05/2021   Procedure: AMPUTATION Left Second Toe;  Surgeon: Joen Laura, MD;  Location: [facility] OR;  Service: Orthopedics;  Laterality: Left;   AV FISTULA PLACEMENT Left 11/02/2014   Procedure: ARTERIOVENOUS (AV) FISTULA CREATION;  Surgeon: Pryor Ochoa, MD;  Location: [facility] OR;  Service: Vascular;  Laterality: Left;   AV FISTULA PLACEMENT Left 04/26/2015   Procedure:  LEFT BRACHIOCEPHALIC ARTERIOVENOUS (AV) FISTULA CREATION;  Surgeon: Fransisco Hertz, MD;  Location: [facility] OR;  Service: Vascular;  Laterality: Left;   EYE SURGERY     INSERTION OF DIALYSIS CATHETER Right 11/04/2014   Procedure: INSERTION OF DIALYSIS CATHETER RIGHT INTERNAL JUGULAR VEIN;  Surgeon: Pryor Ochoa, MD;  Location: [facility] OR;  Service: Vascular;  Laterality: Right;   LIGATION OF ARTERIOVENOUS  FISTULA Left 04/26/2015   Procedure: LIGATION OF LEFT RADIOCEPHALIC ARTERIOVENOUS  FISTULA;  Surgeon: Fransisco Hertz, MD;  Location: [facility] OR;  Service: Vascular;  Laterality: Left;   PERIPHERAL VASCULAR BALLOON ANGIOPLASTY  04/16/2023   Procedure: PERIPHERAL VASCULAR BALLOON ANGIOPLASTY;  Surgeon: Victorino Sparrow, MD;  Location: [facility] INVASIVE CV LAB;  Service: Cardiovascular;;   PERIPHERAL VASCULAR INTERVENTION Left 10/16/2022   Procedure: PERIPHERAL VASCULAR INTERVENTION;  Surgeon: Victorino Sparrow, MD;  Location: [facility] INVASIVE CV LAB;  Service: Cardiovascular;  Laterality: Left;  left AV fistula   REVISON OF ARTERIOVENOUS FISTULA Left 01/18/2015   Procedure: REVISON OF LEFT RADIOCEPHALIC ARTERIOVENOUS FISTULA USING VASCU-GUARD PERIPHERAL VASCULAR PATCH ;  Surgeon: Pryor Ochoa, MD;  Location: [facility] OR;  Service: Vascular;  Laterality: Left;   TONSILLECTOMY      Allergies: Metformin and related  Medications: Prior to Admission medications   Medication Sig Start Date End Date Taking? Authorizing Provider  amLODipine (NORVASC) 10 MG tablet Take 1 tablet (10 mg total) by mouth daily. para la presi n arterial. 05/07/23   Claiborne Rigg,  NP  atorvastatin (LIPITOR) 20 MG tablet Take 1 tablet (20 mg total) by mouth daily. 02/05/23   Claiborne Rigg, NP  Blood Glucose Monitoring Suppl (TRUE METRIX METER) w/Device KIT Use as instructed. Check blood glucose level by fingerstick twice per day. 12/25/21   Claiborne Rigg, NP  calcium acetate (PHOSLO) 667 MG capsule Take 1 capsule (667 mg total) by mouth 3 (three) times daily  with meals. 11/10/14   Osvaldo Shipper, MD  carvedilol (COREG) 12.5 MG tablet Take 1 tablet (12.5 mg total) by mouth 2 (two) times daily with a meal. 05/07/23   Claiborne Rigg, NP  cinacalcet (SENSIPAR) 30 MG tablet Take 1 tablet by mouth once a day Take with largest meal 11/05/22     cinacalcet (SENSIPAR) 60 MG tablet Take 60 mg by mouth daily with lunch.    [provider]  ferric citrate (AURYXIA) 1 GM 210 MG(Fe) tablet Take 420 mg by mouth 2 (two) times daily with a meal.    [provider]  gabapentin (NEURONTIN) 100 MG capsule Take 1 capsule (100 mg total) by mouth at bedtime. For neuropathy 02/04/23   Claiborne Rigg, NP  glucose blood (TRUE METRIX BLOOD GLUCOSE TEST) test strip Use as instructed. Check blood glucose level by fingerstick twice per day. 12/25/21   Claiborne Rigg, NP  Insulin Lispro Prot & Lispro (HUMALOG MIX 75/25 KWIKPEN) (75-25) 100 UNIT/ML Kwikpen Inject 20 Units into the skin in the morning and at bedtime. 05/07/23   Claiborne Rigg, NP  Insulin Pen Needle (B-D UF III MINI PEN NEEDLES) 31G X 5 MM MISC Use as instructed. Inject into the skin once daily 09/13/22   Claiborne Rigg, NP  JANUVIA 25 MG tablet Take 1 tablet (25 mg total) by mouth daily. 01/17/23   Hoy Register, MD  losartan (COZAAR) 25 MG tablet Take 1 tablet (25 mg total) by mouth daily. para la presi n arterial 01/09/23   Hoy Register, MD  sodium zirconium cyclosilicate (LOKELMA) 5 g packet Take 1 packet by mouth three times a week Mix powder with 45ml water and drink. Please take three times weekly 04/16/22     sodium zirconium cyclosilicate (LOKELMA) 5 g packet Take 1 packet by mouth twice a week Mix powder with 45ml water and drink. Please take three times weekly 11/07/22     TRUEplus Lancets 28G MISC Use as instructed. Check blood glucose level by fingerstick twice per day 12/25/21   Claiborne Rigg, NP     Family History  Problem Relation Age of Onset   Diabetes Mother    Diabetes  Father    Diabetes Brother    Hypertension Sister     Social History   Socioeconomic History   Marital status: Married    Spouse name: Not on file   Number of children: Not on file   Years of education: Not on file   Highest education level: Not on file  Occupational History   Not on file  Tobacco Use   Smoking status: Former    Current packs/day: 0.00    Types: Cigarettes    Start date: 01/16/1958    Quit date: 01/17/1984    Years since quitting: 39.4    Passive exposure: Never   Smokeless tobacco: Never  Vaping Use   Vaping status: Never Used  Substance and Sexual Activity   Alcohol use: No    Alcohol/week: 0.0 standard drinks of alcohol   Drug use: No  Sexual activity: Not on file  Other Topics Concern   Not on file  Social History Narrative   Not on file   Social Determinants of Health   Financial Resource Strain: Low Risk  (12/06/2022)   Overall Financial Resource Strain (CARDIA)    Difficulty of Paying Living Expenses: Not very hard  Food Insecurity: No Food Insecurity (12/06/2022)   Hunger Vital Sign    Worried About Running Out of Food in the Last Year: Never true    Ran Out of Food in the Last Year: Never true  Transportation Needs: No Transportation Needs (12/06/2022)   PRAPARE - Administrator, Civil Service (Medical): No    Lack of Transportation (Non-Medical): No  Physical Activity: Inactive (12/06/2022)   Exercise Vital Sign    Days of Exercise per Week: 0 days    Minutes of Exercise per Session: 0 min  Stress: No Stress Concern Present (12/06/2022)   [employer] of Occupational Health - Occupational Stress Questionnaire    Feeling of Stress : Only a little  Social Connections: Moderately Integrated (12/06/2022)   Social Connection and Isolation Panel [NHANES]    Frequency of Communication with Friends and Family: More than three times a week    Frequency of Social Gatherings with Friends and Family: More than three times a week     Attends Religious Services: 1 to 4 times per year    Active Member of [employer] or Organizations: No    Attends Banker Meetings: Never    Marital Status: Married    Review of Systems: A 12 point ROS discussed and pertinent positives are indicated in the HPI above.  All other systems are negative.  Review of Systems  Constitutional:  Negative for activity change, fatigue and fever.  Respiratory:  Negative for cough and shortness of breath.   Cardiovascular:  Negative for chest pain.  Psychiatric/Behavioral:  Negative for behavioral problems and confusion.     Vital Signs: BP (!) 161/65   Pulse 75   Temp 98.2 F (36.8 C) (Oral)   Resp 16   Ht 5' 2.99" (1.6 m)   Wt 145 lb (65.8 kg)   SpO2 95%   BMI 25.69 kg/m   Advance Care Plan: The advanced care plan/surrogate decision maker was discussed at the time of visit and documented in the medical record.    Physical Exam Vitals reviewed.  HENT:     Mouth/Throat:     Mouth: Mucous membranes are moist.  Cardiovascular:     Rate and Rhythm: Normal rate and regular rhythm.     Heart sounds: Normal heart sounds.  Pulmonary:     Effort: Pulmonary effort is normal.     Breath sounds: Normal breath sounds. No wheezing.  Abdominal:     Palpations: Abdomen is soft.  Musculoskeletal:        General: Normal range of motion.     Comments: Left arm dialysis fistula  Good pulse Good thrill ++aneurysmal  Skin:    General: Skin is warm.  Neurological:     Mental Status: He is alert and oriented to person, place, and time.  Psychiatric:        Behavior: Behavior normal.     Comments: Spanish speaking Consented with Interpreter at bedside     Imaging: No results found.  Labs:  CBC: Recent Labs    10/16/22 0925 04/16/23 0826  HGB 12.2* 10.9*  HCT 36.0* 32.0*    COAGS: No results for  input(s): "INR", "APTT" in the last 8760 hours.  BMP: Recent Labs    10/16/22 0925 04/16/23 0826 05/07/23 1443  NA 134*  135 135  K 5.2* 4.8 4.7  CL 96* 93* 88*  CO2  --   --  27  GLUCOSE 124* 76 73  BUN 36* 40* 45*  CALCIUM  --   --  10.5*  CREATININE 7.70* 8.00* 6.80*    LIVER FUNCTION TESTS: Recent Labs    05/07/23 1443  BILITOT 0.2  AST 22  ALT 13  ALKPHOS 121  PROT 7.3  ALBUMIN 4.3    TUMOR MARKERS: No results for input(s): "AFPTM", "CEA", "CA199", "CHROMGRNA" in the last 8760 hours.  Assessment and Plan:  Scheduled for left arm dialysis fistula evaluation with possible intervention Possible tunneled dialysis catheter placement if needed Risks and benefits discussed with the patient including, but not limited to bleeding, infection, vascular injury, need for tunneled HD catheter placement or even death.  All of the patient's questions were answered, patient is agreeable to proceed. Consent signed and in chart.  Thank you for this interesting consult.  I greatly enjoyed meeting [facility] and look forward to participating in their care.  A copy of this report was sent to the requesting provider on this date.  Electronically Signed: Robet Leu, PA-C 06/18/2023, 8:41 AM   I spent a total of  30 Minutes   in face to face in clinical consultation, greater than 50% of which was counseling/coordinating care for left arm dialysis fistula evaluation/ possible tunneled dialysis catheter

## 2023-06-23 ENCOUNTER — Encounter (HOSPITAL_COMMUNITY): Payer: Self-pay | Admitting: *Deleted

## 2023-06-23 ENCOUNTER — Other Ambulatory Visit: Payer: Self-pay

## 2023-07-02 ENCOUNTER — Other Ambulatory Visit: Payer: Self-pay

## 2023-07-10 ENCOUNTER — Other Ambulatory Visit: Payer: Self-pay

## 2023-07-10 MED ORDER — AURYXIA 1 GM 210 MG(FE) PO TABS: 420.0000 mg | ORAL_TABLET | Freq: Three times a day (TID) | ORAL | 11 refills | Status: AC

## 2023-07-14 ENCOUNTER — Encounter (HOSPITAL_COMMUNITY): Payer: Self-pay | Admitting: *Deleted

## 2023-07-23 ENCOUNTER — Other Ambulatory Visit: Payer: Self-pay

## 2023-08-05 ENCOUNTER — Encounter (HOSPITAL_COMMUNITY): Payer: Self-pay | Admitting: *Deleted

## 2023-08-05 ENCOUNTER — Other Ambulatory Visit: Payer: Self-pay | Admitting: Family Medicine

## 2023-08-05 ENCOUNTER — Other Ambulatory Visit: Payer: Self-pay

## 2023-08-05 DIAGNOSIS — E1159 Type 2 diabetes mellitus with other circulatory complications: Secondary | ICD-10-CM

## 2023-08-05 MED ORDER — LOSARTAN POTASSIUM 25 MG PO TABS
25.0000 mg | ORAL_TABLET | Freq: Every day | ORAL | 1 refills | Status: DC
  Filled 2023-08-05: qty 90, 90d supply, fill #0
  Filled 2023-11-10 (×2): qty 90, 90d supply, fill #1

## 2023-08-06 ENCOUNTER — Other Ambulatory Visit: Payer: Self-pay

## 2023-08-27 ENCOUNTER — Other Ambulatory Visit: Payer: Self-pay

## 2023-08-27 MED ORDER — AURYXIA 1 GM 210 MG(FE) PO TABS
ORAL_TABLET | ORAL | 3 refills | Status: AC
  Filled 2024-03-03 – 2024-04-06 (×2): qty 720, 90d supply, fill #0

## 2023-09-10 ENCOUNTER — Other Ambulatory Visit: Payer: Self-pay

## 2023-09-11 ENCOUNTER — Other Ambulatory Visit: Payer: Self-pay

## 2023-09-29 ENCOUNTER — Ambulatory Visit: Payer: Self-pay | Attending: Nurse Practitioner | Admitting: Nurse Practitioner

## 2023-09-29 ENCOUNTER — Encounter: Payer: Self-pay | Admitting: Nurse Practitioner

## 2023-09-29 ENCOUNTER — Other Ambulatory Visit: Payer: Self-pay

## 2023-09-29 ENCOUNTER — Encounter (HOSPITAL_COMMUNITY): Payer: Self-pay | Admitting: *Deleted

## 2023-09-29 VITALS — BP 179/68 | HR 71 | Resp 19 | Ht 62.0 in | Wt 153.2 lb

## 2023-09-29 DIAGNOSIS — I152 Hypertension secondary to endocrine disorders: Secondary | ICD-10-CM

## 2023-09-29 DIAGNOSIS — Z992 Dependence on renal dialysis: Secondary | ICD-10-CM

## 2023-09-29 DIAGNOSIS — N186 End stage renal disease: Secondary | ICD-10-CM

## 2023-09-29 DIAGNOSIS — E0822 Diabetes mellitus due to underlying condition with diabetic chronic kidney disease: Secondary | ICD-10-CM

## 2023-09-29 DIAGNOSIS — E1159 Type 2 diabetes mellitus with other circulatory complications: Secondary | ICD-10-CM

## 2023-09-29 DIAGNOSIS — M7989 Other specified soft tissue disorders: Secondary | ICD-10-CM

## 2023-09-29 DIAGNOSIS — Z794 Long term (current) use of insulin: Secondary | ICD-10-CM

## 2023-09-29 LAB — POCT GLYCOSYLATED HEMOGLOBIN (HGB A1C): Hemoglobin A1C: 7.8 % — AB (ref 4.0–5.6)

## 2023-09-29 MED ORDER — GLUCOSE 2 G PO CHEW
CHEWABLE_TABLET | ORAL | 1 refills | Status: AC
  Filled 2023-09-29: qty 30, fill #0

## 2023-09-29 MED ORDER — FUROSEMIDE 20 MG PO TABS
20.0000 mg | ORAL_TABLET | Freq: Every day | ORAL | 3 refills | Status: AC
  Filled 2023-09-29: qty 30, 30d supply, fill #0
  Filled 2023-11-10 (×3): qty 30, 30d supply, fill #1
  Filled 2024-02-24: qty 30, 30d supply, fill #2
  Filled 2024-09-10: qty 30, 30d supply, fill #3

## 2023-09-29 NOTE — Patient Instructions (Addendum)
 If blood glucose less than 70. Administer 1 tablet and wait 15 minutes to check blood sugar. If still below 70 take an additional tablet and repeat  instructions.

## 2023-09-29 NOTE — Progress Notes (Signed)
 Assessment & Plan:  Brian Fuller was seen today for diabetes.  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 Keep blood sugar logs with fasting goal of 90-130 mg/dl, post prandial (after you eat) less than 180.  For Hypoglycemia: BS <60 and Hyperglycemia BS >400; contact the clinic ASAP. Annual eye exams and foot exams are recommended.   Localized swelling of lower extremity -     furosemide  (LASIX ) 20 MG tablet; Take 1 tablet (20 mg total) by mouth daily. Only take as needed for swelling in lower extremity DASH DIET  Hypertension associated with type 2 diabetes mellitus (HCC) Please bring all of your medications with you to your next appointment.    Patient has been counseled on age-appropriate routine health concerns for screening and prevention. These are reviewed and up-to-date. Referrals have been placed accordingly. Immunizations are up-to-date or declined.    Subjective:   Chief Complaint  Patient presents with   Diabetes    Brian Fuller 73 y.o. male presents to office today   He is accompanied by his neighbor who is translating for him.    He has a past medical history of Anemia, Chronic kidney disease, DM 2, Diabetic retinopathy, ESRD, Glaucoma, Hypertension, Hyperthyroidism, Loss of vision (08/20/2015), Pneumonia, and Systolic murmur    HTN He is currently prescribed amlodipine  10 mg, carvedilol  12.5mg  BID, losartan  25 mg daily. Blood pressure  is elevated today. He does not have any of his medications with him for review today. Can not recall any of the medications that he is taking and can only usually state the amount of pills he takes daily. Notes increased BLE swelling.  BP Readings from Last 3 Encounters:  09/29/23 (!) 179/68  06/18/23 (!) 164/75  05/07/23 139/74    DM  A1c improving. Stopped taking insulin  2 weeks ago. States a few hours after he takes his insulin  he feels hot and clammy but does not have any blood glucose readings here with him today.Aaron Aas He is taking Jardiance 25 mg daily. Lab Results  Component Value Date   HGBA1C 7.8 (A) 09/29/2023    Lab Results  Component Value Date   HGBA1C 8.2 (A) 05/07/2023    Review of Systems  Constitutional:  Negative for fever, malaise/fatigue and weight loss.  HENT: Negative.  Negative for nosebleeds.   Eyes: Negative.  Negative for blurred vision, double vision and photophobia.  Respiratory: Negative.  Negative for cough and shortness of breath.   Cardiovascular: Negative.  Negative for chest pain, palpitations and leg swelling.  Gastrointestinal: Negative.  Negative for heartburn, nausea and vomiting.  Musculoskeletal: Negative.  Negative for myalgias.  Neurological: Negative.  Negative for dizziness, focal weakness, seizures and headaches.  Psychiatric/Behavioral: Negative.  Negative for suicidal ideas.     Past Medical History:  Diagnosis Date   Anemia    Chronic kidney disease    dialysis, T/Th/Sat   Diabetes mellitus without complication (HCC)    Diabetic retinopathy (  HCC)    ESRD (end stage renal disease) (HCC)    Glaucoma    Hypertension    Hyperthyroidism    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    Pneumonia    Systolic murmur     Past Surgical History:  Procedure Laterality Date   A/V FISTULAGRAM Left 10/16/2022   Procedure: A/V Fistulagram;  Surgeon: Kayla Part, MD;  Location: [facility] INVASIVE CV LAB;  Service: Cardiovascular;  Laterality: Left;    A/V FISTULAGRAM Left 04/16/2023   Procedure: A/V Fistulagram;  Surgeon: Kayla Part, MD;  Location: [facility] INVASIVE CV LAB;  Service: Cardiovascular;  Laterality: Left;   AMPUTATION TOE Left 09/05/2021   Procedure: AMPUTATION Left Second Toe;  Surgeon: Murleen Arms, MD;  Location: [facility] OR;  Service: Orthopedics;  Laterality: Left;   AV FISTULA PLACEMENT Left 11/02/2014   Procedure: ARTERIOVENOUS (AV) FISTULA CREATION;  Surgeon: Palma Bob, MD;  Location: [facility] OR;  Service: Vascular;  Laterality: Left;   AV FISTULA PLACEMENT Left 04/26/2015   Procedure: LEFT BRACHIOCEPHALIC ARTERIOVENOUS (AV) FISTULA CREATION;  Surgeon: Arvil Lauber, MD;  Location: [facility] OR;  Service: Vascular;  Laterality: Left;   EYE SURGERY     INSERTION OF DIALYSIS CATHETER Right 11/04/2014   Procedure: INSERTION OF DIALYSIS CATHETER RIGHT INTERNAL JUGULAR VEIN;  Surgeon: Palma Bob, MD;  Location: [facility] OR;  Service: Vascular;  Laterality: Right;   IR AV DIALY SHUNT INTRO NEEDLE/INTRACATH INITIAL W/PTA/IMG LEFT  06/18/2023   IR US  GUIDE VASC ACCESS LEFT  06/18/2023   LIGATION OF ARTERIOVENOUS  FISTULA Left 04/26/2015   Procedure: LIGATION OF LEFT RADIOCEPHALIC ARTERIOVENOUS  FISTULA;  Surgeon: Arvil Lauber, MD;  Location: [facility] OR;  Service: Vascular;  Laterality: Left;   PERIPHERAL VASCULAR BALLOON ANGIOPLASTY  04/16/2023   Procedure: PERIPHERAL VASCULAR BALLOON ANGIOPLASTY;  Surgeon: Kayla Part, MD;  Location[facility] INVASIVE CV LAB;  Service: Cardiovascular;;   PERIPHERAL VASCULAR INTERVENTION Left 10/16/2022   Procedure: PERIPHERAL VASCULAR INTERVENTION;  Surgeon: Kayla Part, MD;  Location: [facility] INVASIVE CV LAB;  Service: Cardiovascular;  Laterality: Left;  left AV fistula   REVISON OF ARTERIOVENOUS FISTULA Left 01/18/2015   Procedure: REVISON OF LEFT RADIOCEPHALIC ARTERIOVENOUS FISTULA USING VASCU-GUARD PERIPHERAL VASCULAR PATCH ;  Surgeon: Palma Bob, MD;  Location: [facility] OR;  Service: Vascular;  Laterality: Left;    TONSILLECTOMY      Family History  Problem Relation Age of Onset   Diabetes Mother    Diabetes Father    Diabetes Brother    Hypertension Sister     Social History Reviewed with no changes to be made today.   Outpatient Medications Prior to Visit  Medication Sig Dispense Refill   amLODipine  (NORVASC ) 10 MG tablet Take 1 tablet (10 mg total) by mouth daily. para la presi n arterial. 90 tablet 1   atorvastatin  (LIPITOR) 20 MG tablet Take 1 tablet (20 mg total) by mouth daily. 90 tablet 1   Blood Glucose Monitoring Suppl (TRUE METRIX METER) w/Device KIT Use as instructed. Check blood glucose level by fingerstick twice per day. 1 kit 0   calcium  acetate (PHOSLO ) 667 MG capsule Take 1 capsule (667 mg total) by mouth 3 (three) times daily with meals. 90 capsule 0   carvedilol  (COREG ) 12.5 MG tablet Take 1 tablet (12.5 mg total) by mouth 2 (two) times daily with a meal. 180 tablet 1   cinacalcet  (SENSIPAR ) 30 MG tablet Take 1 tablet by mouth once  a day Take with largest meal 90 tablet 3   cinacalcet  (SENSIPAR ) 60 MG tablet Take 60 mg by mouth daily with lunch.     ferric citrate (AURYXIA ) 1 GM 210 MG(Fe) tablet Take 420 mg by mouth 2 (two) times daily with a meal.     ferric citrate (AURYXIA ) 1 GM 210 MG(Fe) tablet Take 2 tablets (420 mg total) by mouth 3 (three) times daily with meals. 180 tablet 11   ferric citrate (AURYXIA ) 1 GM 210 MG(Fe) tablet Take 8 tablet by mouth every 24 hours with meals Take 2 tablets three times a day with meals and 1 tablet two times a day with snacks) 720 tablet 3   gabapentin  (NEURONTIN ) 100 MG capsule Take 1 capsule (100 mg total) by mouth at bedtime. For neuropathy 90 capsule 3   glucose blood (TRUE METRIX BLOOD GLUCOSE TEST) test strip Use as instructed. Check blood glucose level by fingerstick twice per day. 100 each 12   Insulin  Lispro Prot & Lispro (HUMALOG  MIX 75/25 KWIKPEN) (75-25) 100 UNIT/ML Kwikpen Inject 20 Units into the skin in the morning and at  bedtime. 45 mL 6   Insulin  Pen Needle (B-D UF III MINI PEN NEEDLES) 31G X 5 MM MISC Use as instructed. Inject into the skin once daily 100 each 3   JANUVIA  25 MG tablet Take 1 tablet (25 mg total) by mouth daily. 90 tablet 0   losartan  (COZAAR ) 25 MG tablet Take 1 tablet (25 mg total) by mouth daily. para la presi n arterial 90 tablet 1   sodium zirconium cyclosilicate  (LOKELMA ) 10 g PACK packet Take 1 packet by mouth three times a week Mix powder with 45ml water and drink. Please take three times weekly 13 packet 2   sodium zirconium cyclosilicate  (LOKELMA ) 5 g packet Take 1 packet by mouth three times a week Mix powder with 45ml water and drink. Please take three times weekly 13 packet 2   sodium zirconium cyclosilicate  (LOKELMA ) 5 g packet Take 1 packet by mouth twice a week Mix powder with 45ml water and drink. Please take three times weekly 9 packet 2   TRUEplus Lancets 28G MISC Use as instructed. Check blood glucose level by fingerstick twice per day 100 each 3   Facility-Administered Medications Prior to Visit  Medication Dose Route Frequency Provider Last Rate Last Admin   0.9 %  sodium chloride  infusion  250 mL Intravenous PRN Robins, Joshua E, MD       0.9 %  sodium chloride  infusion  250 mL Intravenous PRN Robins, Joshua E, MD       sodium chloride  flush (NS) 0.9 % injection 3 mL  3 mL Intravenous Q12H Robins, Joshua E, MD       sodium chloride  flush (NS) 0.9 % injection 3 mL  3 mL Intravenous Q12H Robins, Joshua E, MD       sodium chloride  flush (NS) 0.9 % injection 3 mL  3 mL Intravenous PRN Robins, Joshua E, MD        Allergies  Allergen Reactions   Metformin And Related Other (See Comments)    Metformin contraindication in ESRD       Objective:    BP (!) 179/68 (BP Location: Left Arm, Patient Position: Sitting, Cuff Size: Normal)   Pulse 71   Resp 19   Ht 5\' 2"  (1.575 m)   Wt 153 lb 3.2 oz (69.5 kg)   SpO2 95%   BMI 28.02 kg/m  Wt Readings from  Last 3 Encounters:   09/29/23 153 lb 3.2 oz (69.5 kg)  06/18/23 145 lb (65.8 kg)  05/07/23 150 lb 6.4 oz (68.2 kg)    Physical Exam Vitals and nursing note reviewed.  Constitutional:      Appearance: He is well-developed.  HENT:     Head: Normocephalic and atraumatic.  Cardiovascular:     Rate and Rhythm: Normal rate and regular rhythm.     Heart sounds: Normal heart sounds. No murmur heard.    No friction rub. No gallop.  Pulmonary:     Effort: Pulmonary effort is normal. No tachypnea or respiratory distress.     Breath sounds: Normal breath sounds. No decreased breath sounds, wheezing, rhonchi or rales.  Chest:     Chest wall: No tenderness.  Abdominal:     General: Bowel sounds are normal.     Palpations: Abdomen is soft.  Musculoskeletal:        General: Normal range of motion.     Cervical back: Normal range of motion.  Skin:    General: Skin is warm and dry.  Neurological:     Mental Status: He is alert and oriented to person, place, and time.     Coordination: Coordination normal.  Psychiatric:        Behavior: Behavior normal. Behavior is cooperative.        Thought Content: Thought content normal.        Judgment: Judgment normal.          Patient has been counseled extensively about nutrition and exercise as well as the importance of adherence with medications and regular follow-up. The patient was given clear instructions to go to ER or return to medical center if symptoms don't improve, worsen or new problems develop. The patient verbalized understanding.   Follow-up: Return in about 6 weeks (around 11/10/2023) for hypoglycemia.   Collins Dean, FNP-BC [facility] [place], [place] [phone]   09/29/2023, 9:11 PM

## 2023-10-03 ENCOUNTER — Other Ambulatory Visit: Payer: Self-pay

## 2023-10-20 ENCOUNTER — Other Ambulatory Visit: Payer: Self-pay

## 2023-10-20 ENCOUNTER — Encounter (HOSPITAL_COMMUNITY)
Admission: RE | Disposition: A | Discharge status: Home / Self Care | Payer: Self-pay | Source: Home / Self Care | Attending: Nephrology

## 2023-10-20 ENCOUNTER — Ambulatory Visit (HOSPITAL_COMMUNITY)
Admission: RE | Admit: 2023-10-20 | Discharge: 2023-10-20 | Disposition: A | Discharge status: Home / Self Care | Payer: Self-pay | Attending: Nephrology | Admitting: Nephrology

## 2023-10-20 ENCOUNTER — Encounter (HOSPITAL_COMMUNITY): Payer: Self-pay | Admitting: Nephrology

## 2023-10-20 DIAGNOSIS — Z794 Long term (current) use of insulin: Secondary | ICD-10-CM | POA: Insufficient documentation

## 2023-10-20 DIAGNOSIS — Z79899 Other long term (current) drug therapy: Secondary | ICD-10-CM | POA: Insufficient documentation

## 2023-10-20 DIAGNOSIS — E1122 Type 2 diabetes mellitus with diabetic chronic kidney disease: Secondary | ICD-10-CM | POA: Insufficient documentation

## 2023-10-20 DIAGNOSIS — Z7984 Long term (current) use of oral hypoglycemic drugs: Secondary | ICD-10-CM | POA: Insufficient documentation

## 2023-10-20 DIAGNOSIS — I120 Hypertensive chronic kidney disease with stage 5 chronic kidney disease or end stage renal disease: Secondary | ICD-10-CM | POA: Insufficient documentation

## 2023-10-20 DIAGNOSIS — E11319 Type 2 diabetes mellitus with unspecified diabetic retinopathy without macular edema: Secondary | ICD-10-CM | POA: Insufficient documentation

## 2023-10-20 DIAGNOSIS — E039 Hypothyroidism, unspecified: Secondary | ICD-10-CM | POA: Insufficient documentation

## 2023-10-20 DIAGNOSIS — T82858A Stenosis of vascular prosthetic devices, implants and grafts, initial encounter: Secondary | ICD-10-CM | POA: Insufficient documentation

## 2023-10-20 DIAGNOSIS — D631 Anemia in chronic kidney disease: Secondary | ICD-10-CM | POA: Insufficient documentation

## 2023-10-20 DIAGNOSIS — Z87891 Personal history of nicotine dependence: Secondary | ICD-10-CM | POA: Insufficient documentation

## 2023-10-20 DIAGNOSIS — N250 Renal osteodystrophy: Secondary | ICD-10-CM | POA: Insufficient documentation

## 2023-10-20 DIAGNOSIS — Z992 Dependence on renal dialysis: Secondary | ICD-10-CM | POA: Insufficient documentation

## 2023-10-20 DIAGNOSIS — Y832 Surgical operation with anastomosis, bypass or graft as the cause of abnormal reaction of the patient, or of later complication, without mention of misadventure at the time of the procedure: Secondary | ICD-10-CM | POA: Insufficient documentation

## 2023-10-20 DIAGNOSIS — N186 End stage renal disease: Secondary | ICD-10-CM | POA: Insufficient documentation

## 2023-10-20 SURGERY — A/V FISTULAGRAM
Anesthesia: LOCAL

## 2023-10-20 MED ORDER — IODIXANOL 320 MG/ML IV SOLN
INTRAVENOUS | Status: DC | PRN
  Administered 2023-10-20: 15 mL via INTRAVENOUS

## 2023-10-20 MED ORDER — MIDAZOLAM HCL 2 MG/2ML IJ SOLN
INTRAMUSCULAR | Status: AC
  Filled 2023-10-20: qty 2

## 2023-10-20 MED ORDER — LIDOCAINE HCL (PF) 1 % IJ SOLN
INTRAMUSCULAR | Status: DC | PRN
  Administered 2023-10-20: 2 mL via INTRADERMAL

## 2023-10-20 MED ORDER — FENTANYL CITRATE (PF) 100 MCG/2ML IJ SOLN
INTRAMUSCULAR | Status: DC | PRN
  Administered 2023-10-20: 50 ug via INTRAVENOUS

## 2023-10-20 MED ORDER — LIDOCAINE HCL (PF) 1 % IJ SOLN
INTRAMUSCULAR | Status: AC
  Filled 2023-10-20: qty 30

## 2023-10-20 MED ORDER — FENTANYL CITRATE (PF) 100 MCG/2ML IJ SOLN
INTRAMUSCULAR | Status: AC
  Filled 2023-10-20: qty 2

## 2023-10-20 MED ORDER — MIDAZOLAM HCL 2 MG/2ML IJ SOLN
INTRAMUSCULAR | Status: DC | PRN
  Administered 2023-10-20: 1 mg via INTRAVENOUS

## 2023-10-20 MED ORDER — HEPARIN (PORCINE) IN NACL 1000-0.9 UT/500ML-% IV SOLN
INTRAVENOUS | Status: DC | PRN
  Administered 2023-10-20: 500 mL

## 2023-10-20 SURGICAL SUPPLY — 10 items
BAG SNAP BAND KOVER 36X36 (MISCELLANEOUS) ×2 IMPLANT
BALLN LUTONIX AV 8X60X75 (BALLOONS) ×2 IMPLANT
BALLN MUSTANG 6X80X75 (BALLOONS) ×2 IMPLANT
BALLOON LUTONIX AV 8X60X75 (BALLOONS) IMPLANT
BALLOON MUSTANG 6X80X75 (BALLOONS) IMPLANT
COVER DOME SNAP 22 D (MISCELLANEOUS) ×2 IMPLANT
GUIDEWIRE ANGLED .035 180CM (WIRE) IMPLANT
SHEATH PINNACLE R/O II 6F 4CM (SHEATH) IMPLANT
SYR MEDALLION 10ML (SYRINGE) IMPLANT
TRAY PV CATH (CUSTOM PROCEDURE TRAY) ×2 IMPLANT

## 2023-10-20 NOTE — Op Note (Signed)
 Patient presents with decreased access flows of his left BCF (placed April 26, 2015 by Dr. Imogene Burn). His last procedure here was a 8 mm outflow vein plus arch angioplasty done on April 16, 2023 by Dr. Karin Lieu. 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  Fistula is hyper pulsatile.     On the physical exam, the fistula is hyperpulsatile towards the outflow.  Summary:  1)      The patient had successful angioplasty (8x6 Lutonix FE ~10 atm) of significant 50% stenosis in the outflow cephalic vein at the edge of the outflow aneurysm; we did not deliver drug at this location. 2)    Successful angioplasty of 80 to 90% stenosis at the cephalic arch including confluence with a 6 x 8 Mustang followed by the 8 x 6 Lutonix with drug delivered and inflated for 3 minutes at approximately 5 atm of pressure. 3)      Flows improved after outflow angioplasty; inflow not be visualized because of the size of the 2 aneurysms in the cannulation zone.   Centrals were patent.   3)      This left BCF remains amenable to future percutaneous intervention as long as it remains patent at least 3 months.  Description of procedure: The arm was prepped and draped in the usual sterile fashion. The left upper arm brachial cephalic fistula was cannulated (03474) with an 18G Angiocath needle directed in a antegrade direction in venous limb of the fistula. A guidewire was inserted and exchanged for a 6 Fr sheath. Contrast [phone] injection via the side port of the sheath was performed. The angiogram of the fistula (38756) showed a patent aneurysmal body of the cephalic vein (2 large aneurysms in the cannulation zone), 50% outflow cephalic vein stenosis just beyond the outflow aneurysm, 80 to 90% stenosis in the outflow cephalic vein arch including the confluence.  The centrals were patent; the inflow anastomosis not be visualized because of the  caliber of the aneurysms.    The angled glide wire was advanced and manipulated until the tip of the wire was in the central veins.    A 6x8 Mustang angioplasty balloon was then inserted over the guidewire and positioned at the cephalic vein outflow arch stenosis.   Venous angioplasty (43329) was carried out to 18 ATM with FULL effacement of the waist on the balloon. The repeat angiogram showed 30% residual stenosis with no evidence of extravasation or dissection.  We decided to use a drug-eluting balloon because of the severity of the arch lesion with incredibly sluggish flows prior to angioplasty.  A 8x6  Lutonix  DEB angioplasty balloon was then inserted over the guidewire and positioned at the cephalic vein outflow arch site stenosis.   Venous angioplasty (51884) was carried out to 5 ATM and the balloon was contacting the wall of the vessel but not inflated to 8 mm.  Probably inflated to 6 to 7 mm in diameter.  This was held for at the lesion site for 3 minutes. The repeat angiogram showed 10% residual stenosis at the outflow cephalic vein site  with no evidence of extravasation or dissection.  We then treated the outflow cephalic vein mild lesion with the 8 x 6 Lutonix 95% effaced at 8 atm of pressure.  Final angiogram revealed 10% residual at all levels with no evidence of extravasation or dissection and flows were much more rapid.  Hemostasis: A 3-0 ethilon purse string suture was placed at the cannulation site on removal of the sheath.  Sedation: 1 mg Versed, 50 mcg Fentanyl. Sedation time. 17 minutes  Contrast. 15 mL  Monitoring: Because of the patient's comorbid conditions and sedation during the procedure, continuous EKG monitoring and O2 saturation monitoring was performed throughout the procedure by the RN. There were no abnormal arrhythmias encountered.  Complications: None  Diagnoses: I87.1 Stricture of vein  N18.6 ESRD T82.858A Stricture of access  Procedure Coding:   [phone] Cannulation and angiogram of fistula, venous angioplasty (basilic vein inflow swing site)  [id number] Contrast  Recommendations:  1. Continue to cannulate the fistula with 15G needles.  2. Refer for problems with flows/swelling. 3. Remove the suture next treatment.   Discharge: The patient was discharged home in stable condition. The patient was given education regarding the care of the dialysis access AVF and specific instructions in case of any problems.

## 2023-10-20 NOTE — H&P (Addendum)
 Chief Complaint: Decreased flows  Interval H&P  The patient has presented today for an angiogram/ angioplasty.  Various methods of treatment have been discussed with the patient.  After consideration of risk, benefits and other options for treatment, the patient has consented to a angiogram/ angioplasty with  possible stent placement.   Risks of angiogram with potential angioplasty and stenting if needed.contrast reaction, extravasation/ bleeding, dissection, hypotension and death were explained to the patient.  The patient's history has been reviewed and the patient has been examined, no changes in status.  Stable for angiogram/angioplasty  I have reviewed the patient's chart and labs.  Questions were answered to the patient's satisfaction.  Assessment/Plan: ESRD dialyzing  TTS regimen with last dialysis Saturday Decreased access flows the left brachiocephalic fistula with an aneurysmal cannulation zone.  Access was placed April 26, 2015 by Dr. Imogene Burn.  Last intervention was outflow 8 mm PTA by Dr. Karin Lieu 04/16/23.- planning on angiogram with possibly angioplasty. Renal osteodystrophy - continue binders per home regimen. Anemia - managed with ESA's and IV iron at dialysis center. HTN - resume home regimen.   HPI: Brian Fuller is an 73 y.o. male with a history of diabetes, hypertension, hypothyroidism, blindness in the right eye, diabetic retinopathy, ESRD dialyzing Tuesday Thursdays and Saturdays with last full treatment on Saturday.  Patient being referred for decreased access flows in the left upper arm brachiocephalic fistula placed in 2016 by Dr. Imogene Burn.  ROS Per HPI.  Chemistry and CBC: Creatinine  Date/Time Value Ref Range Status  02/24/2014 11:44 AM 4.79 (H) 0.50 - 1.35 mg/dL Final   Creat  Date/Time Value Ref Range Status  01/18/2016 10:27 AM 6.82 (H) 0.70 - 1.25 mg/dL Final   Creatinine, Ser  Date/Time Value Ref Range Status  05/07/2023 02:43 PM 6.80 (H) 0.76 -  1.27 mg/dL Final  96/11/5407 81:19 AM 8.00 (H) 0.61 - 1.24 mg/dL Final  14/78/2956 21:30 AM 7.70 (H) 0.61 - 1.24 mg/dL Final  86/57/8469 62:95 AM 7.11 (H) 0.76 - 1.27 mg/dL Final  28/41/3244 01:02 AM 7.82 (H) 0.61 - 1.24 mg/dL Final  72/53/6644 03:47 AM 5.38 (H) 0.61 - 1.24 mg/dL Final  42/59/5638 75:64 PM 4.00 (H) 0.61 - 1.24 mg/dL Final  33/29/5188 41:66 AM 4.74 (H) 0.50 - 1.35 mg/dL Final  02/16/1600 09:32 AM 5.79 (H) 0.50 - 1.35 mg/dL Final  35/57/3220 25:42 AM 5.86 (H) 0.50 - 1.35 mg/dL Final  70/62/3762 83:15 AM 5.61 (H) 0.50 - 1.35 mg/dL Final  17/61/6073 71:06 AM 7.29 (H) 0.50 - 1.35 mg/dL Final  26/94/8546 27:03 AM 7.03 (H) 0.50 - 1.35 mg/dL Final  50/04/3817 29:93 AM 6.83 (H) 0.50 - 1.35 mg/dL Final  71/69/6789 38:10 AM 6.72 (H) 0.50 - 1.35 mg/dL Final  17/51/0258 52:77 AM 6.08 (H) 0.50 - 1.35 mg/dL Final  82/42/3536 14:43 PM 5.98 (H) 0.50 - 1.35 mg/dL Final  15/40/0867 61:95 AM 5.93 (H) 0.50 - 1.35 mg/dL Final  09/32/6712 45:80 AM 5.11 (H) 0.50 - 1.35 mg/dL Final  99/83/3825 05:39 AM 4.79 (H) 0.50 - 1.35 mg/dL Final  76/73/4193 79:02 AM 4.82 (H) 0.50 - 1.35 mg/dL Final  40/97/3532 99:24 AM 4.67 (H) 0.50 - 1.35 mg/dL Final  26/83/4196 22:29 AM 4.40 (H) 0.50 - 1.35 mg/dL Final  79/89/2119 41:74 PM 4.70 (H) 0.50 - 1.35 mg/dL Final  04/01/4817 56:31 PM 4.41 (H) 0.50 - 1.35 mg/dL Final   No results for input(s): "NA", "K", "CL", "CO2", "GLUCOSE", "BUN", "CREATININE", "CALCIUM", "PHOS" in the last 168 hours.  Invalid  input(s): "ALB" No results for input(s): "WBC", "NEUTROABS", "HGB", "HCT", "MCV", "PLT" in the last 168 hours. Liver Function Tests: No results for input(s): "AST", "ALT", "ALKPHOS", "BILITOT", "PROT", "ALBUMIN" in the last 168 hours. No results for input(s): "LIPASE", "AMYLASE" in the last 168 hours. No results for input(s): "AMMONIA" in the last 168 hours. Cardiac Enzymes: No results for input(s): "CKTOTAL", "CKMB", "CKMBINDEX", "TROPONINI" in the last 168  hours. Iron Studies: No results for input(s): "IRON", "TIBC", "TRANSFERRIN", "FERRITIN" in the last 72 hours. PT/INR: @LABRCNTIP (inr:5)  Xrays/Other Studies: )No results found for this or any previous visit (from the past 48 hours). No results found.  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    Pneumonia    Systolic murmur     PSH:   Past Surgical History:  Procedure Laterality Date   A/V FISTULAGRAM Left 10/16/2022   Procedure: A/V Fistulagram;  Surgeon: Victorino Sparrow, MD;  Location: [facility] INVASIVE CV LAB;  Service: Cardiovascular;  Laterality: Left;   A/V FISTULAGRAM Left 04/16/2023   Procedure: A/V Fistulagram;  Surgeon: Victorino Sparrow, MD;  Location: [facility] INVASIVE CV LAB;  Service: Cardiovascular;  Laterality: Left;   AMPUTATION TOE Left 09/05/2021   Procedure: AMPUTATION Left Second Toe;  Surgeon: Joen Laura, MD;  Location: [facility] OR;  Service: Orthopedics;  Laterality: Left;   AV FISTULA PLACEMENT Left 11/02/2014   Procedure: ARTERIOVENOUS (AV) FISTULA CREATION;  Surgeon: Pryor Ochoa, MD;  Location: [facility] OR;  Service: Vascular;  Laterality: Left;   AV FISTULA PLACEMENT Left 04/26/2015   Procedure: LEFT BRACHIOCEPHALIC ARTERIOVENOUS (AV) FISTULA CREATION;  Surgeon: Fransisco Hertz, MD;  Location: [facility] OR;  Service: Vascular;  Laterality: Left;   EYE SURGERY     INSERTION OF DIALYSIS CATHETER Right 11/04/2014   Procedure: INSERTION OF DIALYSIS CATHETER RIGHT INTERNAL JUGULAR VEIN;  Surgeon: Pryor Ochoa, MD;  Location: [facility] OR;  Service: Vascular;  Laterality: Right;   IR AV DIALY SHUNT INTRO NEEDLE/INTRACATH INITIAL W/PTA/IMG LEFT  06/18/2023   IR US GUIDE VASC ACCESS LEFT  06/18/2023   LIGATION OF ARTERIOVENOUS   FISTULA Left 04/26/2015   Procedure: LIGATION OF LEFT RADIOCEPHALIC ARTERIOVENOUS  FISTULA;  Surgeon: Fransisco Hertz, MD;  Location: [facility] OR;  Service: Vascular;  Laterality: Left;   PERIPHERAL VASCULAR BALLOON ANGIOPLASTY  04/16/2023   Procedure: PERIPHERAL VASCULAR BALLOON ANGIOPLASTY;  Surgeon: Victorino Sparrow, MD;  Location: [facility] INVASIVE CV LAB;  Service: Cardiovascular;;   PERIPHERAL VASCULAR INTERVENTION Left 10/16/2022   Procedure: PERIPHERAL VASCULAR INTERVENTION;  Surgeon: Victorino Sparrow, MD;  Location: [facility] INVASIVE CV LAB;  Service: Cardiovascular;  Laterality: Left;  left AV fistula   REVISON OF ARTERIOVENOUS FISTULA Left 01/18/2015   Procedure: REVISON OF LEFT RADIOCEPHALIC ARTERIOVENOUS FISTULA USING VASCU-GUARD PERIPHERAL VASCULAR PATCH ;  Surgeon: Pryor Ochoa, MD;  Location: [facility] OR;  Service: Vascular;  Laterality: Left;   TONSILLECTOMY      Allergies:  Allergies  Allergen Reactions   Metformin And Related Other (See Comments)    Metformin contraindication in ESRD Dizziness    Medications:   Prior to Admission medications   Medication Sig Start Date  End Date Taking? Authorizing Provider  amLODipine (NORVASC) 10 MG tablet Take 1 tablet (10 mg total) by mouth daily. para la presi n arterial. 05/07/23  Yes Claiborne Rigg, NP  atorvastatin (LIPITOR) 20 MG tablet Take 1 tablet (20 mg total) by mouth daily. 02/05/23  Yes Claiborne Rigg, NP  carvedilol (COREG) 12.5 MG tablet Take 1 tablet (12.5 mg total) by mouth 2 (two) times daily with a meal. 05/07/23  Yes Claiborne Rigg, NP  cinacalcet (SENSIPAR) 30 MG tablet Take 1 tablet by mouth once a day Take with largest meal 11/05/22  Yes   ferric citrate (AURYXIA) 1 GM 210 MG(Fe) tablet Take 8 tablet by mouth every 24 hours with meals Take 2 tablets three times a day with meals and 1 tablet two times a day with snacks) 08/27/23  Yes   gabapentin (NEURONTIN) 100 MG capsule Take 1 capsule (100 mg total) by mouth at bedtime. For neuropathy  02/04/23  Yes Claiborne Rigg, NP  Insulin Lispro Prot & Lispro (HUMALOG MIX 75/25 KWIKPEN) (75-25) 100 UNIT/ML Kwikpen Inject 20 Units into the skin in the morning and at bedtime. Patient taking differently: Inject 14 Units into the skin daily. 05/07/23  Yes Claiborne Rigg, NP  JANUVIA 25 MG tablet Take 1 tablet (25 mg total) by mouth daily. 01/17/23  Yes Hoy Register, MD  losartan (COZAAR) 25 MG tablet Take 1 tablet (25 mg total) by mouth daily. para la presi n arterial 08/05/23  Yes Claiborne Rigg, NP  sodium zirconium cyclosilicate (LOKELMA) 10 g PACK packet Take 1 packet by mouth three times a week Mix powder with 45ml water and drink. Please take three times weekly 12/19/22  Yes   Blood Glucose Monitoring Suppl (TRUE METRIX METER) w/Device KIT Use as instructed. Check blood glucose level by fingerstick twice per day. 12/25/21   Claiborne Rigg, NP  ferric citrate (AURYXIA) 1 GM 210 MG(Fe) tablet Take 2 tablets (420 mg total) by mouth 3 (three) times daily with meals. Patient not taking: Reported on 10/15/2023 07/10/23     furosemide (LASIX) 20 MG tablet Take 1 tablet (20 mg total) by mouth daily. Only take as needed for swelling in lower extremity Patient not taking: Reported on 10/15/2023 09/29/23   Claiborne Rigg, NP  Glucose 2 g CHEW Administer per instructions 09/29/23   Claiborne Rigg, NP  glucose blood (TRUE METRIX BLOOD GLUCOSE TEST) test strip Use as instructed. Check blood glucose level by fingerstick twice per day. 12/25/21   Claiborne Rigg, NP  Insulin Pen Needle (B-D UF III MINI PEN NEEDLES) 31G X 5 MM MISC Use as instructed. Inject into the skin once daily 09/13/22   Claiborne Rigg, NP  TRUEplus Lancets 28G MISC Use as instructed. Check blood glucose level by fingerstick twice per day 12/25/21   Claiborne Rigg, NP    Discontinued Meds:   Medications Discontinued During This Encounter  Medication Reason   cinacalcet (SENSIPAR) 60 MG tablet Patient Preference   ferric citrate  (AURYXIA) 1 GM 210 MG(Fe) tablet Duplicate   sodium zirconium cyclosilicate (LOKELMA) 5 g packet Patient Preference   sodium zirconium cyclosilicate (LOKELMA) 5 g packet Duplicate   calcium acetate (PHOSLO) 667 MG capsule Completed Course    Social History:  reports that he quit smoking about 39 years ago. His smoking use included cigarettes. He started smoking about 65 years ago. He has never been exposed to tobacco smoke. He has never used smokeless  tobacco. He reports that he does not drink alcohol and does not use drugs.  Family History:   Family History  Problem Relation Age of Onset   Diabetes Mother    Diabetes Father    Diabetes Brother    Hypertension Sister     Blood pressure (!) 156/63, pulse 67, resp. rate 16, SpO2 94%. GEN: NAD, A&Ox3, NCAT HEENT: No conjunctival pallor, EOMI NECK: Supple, no thyromegaly LUNGS: CTA B/L no rales, rhonchi or wheezing CV: RRR, No M/R/G ABD: SNDNT +BS  EXT: 1+ extremity edema b/l ACCESS: lt BCF 2 large aneuruysms hyper pulsatile        Odaly Peri, Len Blalock, MD 10/20/2023, 8:38 AM

## 2023-10-20 NOTE — Discharge Instructions (Signed)

## 2023-10-22 ENCOUNTER — Other Ambulatory Visit: Payer: Self-pay

## 2023-11-10 ENCOUNTER — Other Ambulatory Visit: Payer: Self-pay | Admitting: Nurse Practitioner

## 2023-11-10 ENCOUNTER — Encounter (HOSPITAL_COMMUNITY): Payer: Self-pay | Admitting: *Deleted

## 2023-11-10 ENCOUNTER — Encounter: Payer: Self-pay | Admitting: Nurse Practitioner

## 2023-11-10 ENCOUNTER — Other Ambulatory Visit: Payer: Self-pay

## 2023-11-10 ENCOUNTER — Ambulatory Visit: Payer: Self-pay | Attending: Nurse Practitioner | Admitting: Nurse Practitioner

## 2023-11-10 VITALS — BP 156/61 | HR 68 | Resp 18 | Ht 60.0 in | Wt 151.8 lb

## 2023-11-10 DIAGNOSIS — N186 End stage renal disease: Secondary | ICD-10-CM

## 2023-11-10 DIAGNOSIS — I10 Essential (primary) hypertension: Secondary | ICD-10-CM

## 2023-11-10 DIAGNOSIS — E785 Hyperlipidemia, unspecified: Secondary | ICD-10-CM

## 2023-11-10 DIAGNOSIS — E1169 Type 2 diabetes mellitus with other specified complication: Secondary | ICD-10-CM

## 2023-11-10 DIAGNOSIS — E1159 Type 2 diabetes mellitus with other circulatory complications: Secondary | ICD-10-CM

## 2023-11-10 DIAGNOSIS — Z992 Dependence on renal dialysis: Secondary | ICD-10-CM

## 2023-11-10 DIAGNOSIS — M7989 Other specified soft tissue disorders: Secondary | ICD-10-CM

## 2023-11-10 DIAGNOSIS — E1122 Type 2 diabetes mellitus with diabetic chronic kidney disease: Secondary | ICD-10-CM

## 2023-11-10 MED ORDER — CARVEDILOL 12.5 MG PO TABS
12.5000 mg | ORAL_TABLET | Freq: Two times a day (BID) | ORAL | 1 refills | Status: DC
  Filled 2023-11-10 – 2024-01-16 (×3): qty 180, 90d supply, fill #0
  Filled 2024-04-06: qty 180, 90d supply, fill #1

## 2023-11-10 MED ORDER — ATORVASTATIN CALCIUM 20 MG PO TABS
20.0000 mg | ORAL_TABLET | Freq: Every day | ORAL | 1 refills | Status: DC
Start: 2023-11-10 — End: 2024-04-20
  Filled 2023-11-10: qty 90, 90d supply, fill #0
  Filled 2024-02-24: qty 90, 90d supply, fill #1

## 2023-11-10 MED ORDER — FUROSEMIDE 20 MG PO TABS
20.0000 mg | ORAL_TABLET | Freq: Every day | ORAL | 0 refills | Status: AC | PRN
  Filled 2023-11-10 – 2024-04-06 (×2): qty 30, 30d supply, fill #0

## 2023-11-10 MED ORDER — AMLODIPINE BESYLATE 10 MG PO TABS
10.0000 mg | ORAL_TABLET | Freq: Every day | ORAL | 1 refills | Status: DC
  Filled 2024-02-24: qty 90, 90d supply, fill #0
  Filled 2024-04-06 – 2024-06-03 (×3): qty 90, 90d supply, fill #1

## 2023-11-10 MED ORDER — FUROSEMIDE 20 MG PO TABS: 20.0000 mg | ORAL_TABLET | Freq: Every day | ORAL | 3 refills | Status: DC

## 2023-11-10 NOTE — Progress Notes (Signed)
 Assessment & Plan:  Brian Fuller was seen today for hypoglycemia.  Diagnoses and all orders for this visit:  Type 2 diabetes mellitus with chronic kidney disease on chronic dialysis, without long-term current use of insulin  Continue blood sugar control as discussed in office today, Keep blood sugar logs with fasting goal of 90-130 mg/dl, post prandial (after you eat) less than 180.  For Hypoglycemia: BS <60 and Hyperglycemia BS >400; contact the clinic ASAP.   Localized swelling of lower extremity -     furosemide (LASIX) 20 MG tablet; Take 1 tablet (20 mg total) by mouth daily as needed. Only take as needed for swelling in lower extremity  Hypertension associated with type 2 diabetes mellitus (HCC) -     amLODipine (NORVASC) 10 MG tablet; Take 1 tablet (10 mg total) by mouth daily. para la presi n arterial. -     carvedilol (COREG) 12.5 MG tablet; Take 1 tablet (12.5 mg total) by mouth 2 (two) times daily with a meal. -     [id number]  Hyperlipidemia associated with type 2 diabetes mellitus (HCC) -     atorvastatin (LIPITOR) 20 MG tablet; Take 1 tablet (20 mg total) by mouth daily.    Patient has been counseled on age-appropriate routine health concerns for screening and prevention. These are reviewed and up-to-date. Referrals have been placed accordingly. Immunizations are up-to-date or declined.    Subjective:   Chief Complaint  Patient presents with   Hypoglycemia    Brian Fuller 73 y.o. male presents to office today glucometer readings: unfortunately he does not have his meter and states he lets the dialysis center check his blood sugars.   He is accompanied by longstanding friend who always brings him to his appointments for translating and interpreting.   He has a past medical history of Anemia, Chronic kidney disease, DM 2, Diabetic retinopathy, ESRD, Glaucoma, Hypertension, Hyperthyroidism, Loss of vision (08/20/2015), Pneumonia, and Systolic murmur    Blood  pressure is elevated today. He has been taking furosemide daily instead of as needed for BLE edema.  He does have his location bottles with him today and is taking carvedilol 12.5 mg twice daily, amlodipine 10 mg daily, losartan 25 mg daily. BP Readings from Last 3 Encounters:  11/10/23 (!) 164/67  10/20/23 (!) 134/59  09/29/23 (!) 179/68    DM 2 At his last visit with me on February 10 he reported a few hours after taking his insulin he would feel hot and clammy and anxious.  I instructed him to check his blood glucose levels when he is symptomatic and bring those readings to his office visit today.  He does not have a log with him today and has not been monitoring his blood glucose levels at home..   Review of Systems  Constitutional:  Negative for fever, malaise/fatigue and weight loss.  HENT: Negative.  Negative for nosebleeds.   Eyes: Negative.  Negative for blurred vision, double vision and photophobia.  Respiratory: Negative.  Negative for cough and shortness of breath.   Cardiovascular: Negative.  Negative for chest pain, palpitations and leg swelling.  Gastrointestinal: Negative.  Negative for heartburn, nausea and vomiting.  Musculoskeletal: Negative.  Negative for myalgias.  Neurological: Negative.  Negative for dizziness, focal weakness, seizures and headaches.  Psychiatric/Behavioral: Negative.  Negative for suicidal ideas.     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    Pneumonia    Systolic murmur     Past Surgical History:  Procedure Laterality Date   A/V FISTULAGRAM Left 10/16/2022   Procedure: A/V Fistulagram;  Surgeon: Victorino Sparrow, MD;  Location: [facility] INVASIVE CV LAB;  Service: Cardiovascular;   Laterality: Left;   A/V FISTULAGRAM Left 04/16/2023   Procedure: A/V Fistulagram;  Surgeon: Victorino Sparrow, MD;  Location: [facility] INVASIVE CV LAB;  Service: Cardiovascular;  Laterality: Left;   A/V FISTULAGRAM N/A 10/20/2023   Procedure: A/V Fistulagram;  Surgeon: Ethelene Hal, MD;  Location: [facility] INVASIVE CV LAB;  Service: Cardiovascular;  Laterality: N/A;   AMPUTATION TOE Left 09/05/2021   Procedure: AMPUTATION Left Second Toe;  Surgeon: Joen Laura, MD;  Location: [facility] OR;  Service: Orthopedics;  Laterality: Left;   AV FISTULA PLACEMENT Left 11/02/2014   Procedure: ARTERIOVENOUS (AV) FISTULA CREATION;  Surgeon: Pryor Ochoa, MD;  Location: [facility] OR;  Service: Vascular;  Laterality: Left;   AV FISTULA PLACEMENT Left 04/26/2015   Procedure: LEFT BRACHIOCEPHALIC ARTERIOVENOUS (AV) FISTULA CREATION;  Surgeon: Fransisco Hertz, MD;  Location: [facility] OR;  Service: Vascular;  Laterality: Left;   EYE SURGERY     INSERTION OF DIALYSIS CATHETER Right 11/04/2014   Procedure: INSERTION OF DIALYSIS CATHETER RIGHT INTERNAL JUGULAR VEIN;  Surgeon: Pryor Ochoa, MD;  Location: [facility] OR;  Service: Vascular;  Laterality: Right;   IR AV DIALY SHUNT INTRO NEEDLE/INTRACATH INITIAL W/PTA/IMG LEFT  06/18/2023   IR US GUIDE VASC ACCESS LEFT  06/18/2023   LIGATION OF ARTERIOVENOUS  FISTULA Left 04/26/2015   Procedure: LIGATION OF LEFT RADIOCEPHALIC ARTERIOVENOUS  FISTULA;  Surgeon: Fransisco Hertz, MD;  Location: [facility] OR;  Service: Vascular;  Laterality: Left;   PERIPHERAL VASCULAR BALLOON ANGIOPLASTY  04/16/2023   Procedure: PERIPHERAL VASCULAR BALLOON ANGIOPLASTY;  Surgeon: Victorino Sparrow, MD;  Location: [facility] INVASIVE CV LAB;  Service: Cardiovascular;;   PERIPHERAL VASCULAR BALLOON ANGIOPLASTY  10/20/2023   Procedure: PERIPHERAL VASCULAR BALLOON ANGIOPLASTY;  Surgeon: Ethelene Hal, MD;  Location: [facility] INVASIVE CV LAB;  Service: Cardiovascular;;  80 % Arch   PERIPHERAL VASCULAR INTERVENTION Left 10/16/2022   Procedure: PERIPHERAL VASCULAR  INTERVENTION;  Surgeon: Victorino Sparrow, MD;  Location: [facility] INVASIVE CV LAB;  Service: Cardiovascular;  Laterality: Left;  left AV fistula   REVISON OF ARTERIOVENOUS FISTULA Left 01/18/2015   Procedure: REVISON OF LEFT RADIOCEPHALIC ARTERIOVENOUS FISTULA USING VASCU-GUARD PERIPHERAL VASCULAR PATCH ;  Surgeon: Pryor Ochoa, MD;  Location: [facility] OR;  Service: Vascular;  Laterality: Left;   TONSILLECTOMY      Family History  Problem Relation Age of Onset   Diabetes Mother    Diabetes Father    Diabetes Brother    Hypertension Sister     Social History Reviewed with no changes to be made today.   Outpatient Medications Prior to Visit  Medication Sig Dispense Refill   cinacalcet (SENSIPAR) 30 MG tablet Take 1 tablet by mouth once a day Take with largest meal 90 tablet 3   gabapentin (NEURONTIN) 100 MG capsule Take 1 capsule (100 mg total) by mouth at bedtime. For neuropathy 90 capsule 3   Glucose 2 g CHEW Administer per instructions 30 tablet 1   glucose blood (TRUE METRIX BLOOD  GLUCOSE TEST) test strip Use as instructed. Check blood glucose level by fingerstick twice per day. 100 each 12   Insulin Lispro Prot & Lispro (HUMALOG MIX 75/25 KWIKPEN) (75-25) 100 UNIT/ML Kwikpen Inject 20 Units into the skin in the morning and at bedtime. (Patient taking differently: Inject 14 Units into the skin daily.) 45 mL 6   Insulin Pen Needle (B-D UF III MINI PEN NEEDLES) 31G X 5 MM MISC Use as instructed. Inject into the skin once daily 100 each 3   JANUVIA 25 MG tablet Take 1 tablet (25 mg total) by mouth daily. 90 tablet 0   losartan (COZAAR) 25 MG tablet Take 1 tablet (25 mg total) by mouth daily. para la presi n arterial 90 tablet 1   sodium zirconium cyclosilicate (LOKELMA) 10 g PACK packet Take 1 packet by mouth three times a week Mix powder with 45ml water and drink. Please take three times weekly 13 packet 2   TRUEplus Lancets 28G MISC Use as instructed. Check blood glucose level by fingerstick twice  per day 100 each 3   amLODipine (NORVASC) 10 MG tablet Take 1 tablet (10 mg total) by mouth daily. para la presi n arterial. 90 tablet 1   atorvastatin (LIPITOR) 20 MG tablet Take 1 tablet (20 mg total) by mouth daily. 90 tablet 1   carvedilol (COREG) 12.5 MG tablet Take 1 tablet (12.5 mg total) by mouth 2 (two) times daily with a meal. 180 tablet 1   furosemide (LASIX) 20 MG tablet Take 1 tablet (20 mg total) by mouth daily. Only take as needed for swelling in lower extremity 30 tablet 3   Blood Glucose Monitoring Suppl (TRUE METRIX METER) w/Device KIT Use as instructed. Check blood glucose level by fingerstick twice per day. 1 kit 0   ferric citrate (AURYXIA) 1 GM 210 MG(Fe) tablet Take 2 tablets (420 mg total) by mouth 3 (three) times daily with meals. (Patient not taking: Reported on 10/15/2023) 180 tablet 11   ferric citrate (AURYXIA) 1 GM 210 MG(Fe) tablet Take 8 tablet by mouth every 24 hours with meals Take 2 tablets three times a day with meals and 1 tablet two times a day with snacks) (Patient not taking: Reported on 11/10/2023) 720 tablet 3   Facility-Administered Medications Prior to Visit  Medication Dose Route Frequency Provider Last Rate Last Admin   0.9 %  sodium chloride infusion  250 mL Intravenous PRN Victorino Sparrow, MD       0.9 %  sodium chloride infusion  250 mL Intravenous PRN Victorino Sparrow, MD       sodium chloride flush (NS) 0.9 % injection 3 mL  3 mL Intravenous Q12H Victorino Sparrow, MD       sodium chloride flush (NS) 0.9 % injection 3 mL  3 mL Intravenous Q12H Victorino Sparrow, MD       sodium chloride flush (NS) 0.9 % injection 3 mL  3 mL Intravenous PRN Victorino Sparrow, MD        Allergies  Allergen Reactions   Metformin And Related Other (See Comments)    Metformin contraindication in ESRD Dizziness       Objective:    BP (!) 164/67 (BP Location: Left Arm, Patient Position: Sitting, Cuff Size: Normal)   Pulse 68   Resp 18   Ht 5' (1.524 m)   Wt 151  lb 12.8 oz (68.9 kg)   SpO2 94%   BMI 29.65 kg/m  Wt Readings  from Last 3 Encounters:  11/10/23 151 lb 12.8 oz (68.9 kg)  09/29/23 153 lb 3.2 oz (69.5 kg)  06/18/23 145 lb (65.8 kg)    Physical Exam Vitals and nursing note reviewed.  Constitutional:      Appearance: He is well-developed.  HENT:     Head: Normocephalic and atraumatic.  Cardiovascular:     Rate and Rhythm: Normal rate and regular rhythm.     Heart sounds: Normal heart sounds. No murmur heard.    No friction rub. No gallop.  Pulmonary:     Effort: Pulmonary effort is normal. No tachypnea or respiratory distress.     Breath sounds: Normal breath sounds. No decreased breath sounds, wheezing, rhonchi or rales.  Chest:     Chest wall: No tenderness.  Abdominal:     General: Bowel sounds are normal.     Palpations: Abdomen is soft.  Musculoskeletal:        General: Normal range of motion.     Cervical back: Normal range of motion.  Skin:    General: Skin is warm and dry.  Neurological:     Mental Status: He is alert and oriented to person, place, and time.     Coordination: Coordination normal.  Psychiatric:        Behavior: Behavior normal. Behavior is cooperative.        Thought Content: Thought content normal.        Judgment: Judgment normal.          Patient has been counseled extensively about nutrition and exercise as well as the importance of adherence with medications and regular follow-up. The patient was given clear instructions to go to ER or return to medical center if symptoms don't improve, worsen or new problems develop. The patient verbalized understanding.   Follow-up: Return in about 8 weeks (around 01/05/2024) for a1c.   Claiborne Rigg, FNP-BC [facility] and [facility] [place], [place] [phone]   11/10/2023, 2:30 PM

## 2023-11-11 LAB — CMP14+EGFR
ALT: 28 IU/L (ref 0–44)
AST: 14 IU/L (ref 0–40)
Albumin: 4.3 g/dL (ref 3.8–4.8)
Alkaline Phosphatase: 237 IU/L — ABNORMAL HIGH (ref 44–121)
BUN/Creatinine Ratio: 7 — ABNORMAL LOW (ref 10–24)
BUN: 51 mg/dL — ABNORMAL HIGH (ref 8–27)
Bilirubin Total: 0.5 mg/dL (ref 0.0–1.2)
CO2: 26 mmol/L (ref 20–29)
Calcium: 9.3 mg/dL (ref 8.6–10.2)
Chloride: 96 mmol/L (ref 96–106)
Creatinine, Ser: 7.19 mg/dL — ABNORMAL HIGH (ref 0.76–1.27)
Globulin, Total: 2.9 g/dL (ref 1.5–4.5)
Glucose: 157 mg/dL — ABNORMAL HIGH (ref 70–99)
Potassium: 4.7 mmol/L (ref 3.5–5.2)
Sodium: 140 mmol/L (ref 134–144)
Total Protein: 7.2 g/dL (ref 6.0–8.5)
eGFR: 8 mL/min/{1.73_m2} — ABNORMAL LOW (ref >59)

## 2023-11-14 ENCOUNTER — Other Ambulatory Visit: Payer: Self-pay | Admitting: Nurse Practitioner

## 2023-11-14 DIAGNOSIS — R748 Abnormal levels of other serum enzymes: Secondary | ICD-10-CM

## 2023-11-17 ENCOUNTER — Ambulatory Visit: Payer: Self-pay | Attending: Nurse Practitioner

## 2023-11-17 DIAGNOSIS — R748 Abnormal levels of other serum enzymes: Secondary | ICD-10-CM

## 2023-11-18 LAB — LIPID PANEL
Chol/HDL Ratio: 2.7 ratio (ref 0.0–5.0)
Cholesterol, Total: 101 mg/dL (ref 100–199)
HDL: 37 mg/dL — ABNORMAL LOW (ref >39)
LDL Chol Calc (NIH): 49 mg/dL (ref 0–99)
Triglycerides: 69 mg/dL (ref 0–149)
VLDL Cholesterol Cal: 15 mg/dL (ref 5–40)

## 2023-11-18 LAB — HEPATIC FUNCTION PANEL
ALT: 21 IU/L (ref 0–44)
AST: 10 IU/L (ref 0–40)
Albumin: 4.3 g/dL (ref 3.8–4.8)
Alkaline Phosphatase: 218 IU/L — ABNORMAL HIGH (ref 44–121)
Bilirubin Total: 0.4 mg/dL (ref 0.0–1.2)
Bilirubin, Direct: 0.18 mg/dL (ref 0.00–0.40)
Total Protein: 7.2 g/dL (ref 6.0–8.5)

## 2023-12-09 ENCOUNTER — Other Ambulatory Visit: Payer: Self-pay

## 2023-12-09 ENCOUNTER — Encounter (HOSPITAL_COMMUNITY): Payer: Self-pay | Admitting: *Deleted

## 2023-12-09 MED ORDER — CINACALCET HCL 60 MG PO TABS
60.0000 mg | ORAL_TABLET | Freq: Every day | ORAL | 3 refills | Status: AC
  Filled 2023-12-09 – 2024-03-03 (×4): qty 30, 30d supply, fill #0
  Filled 2024-04-06: qty 30, 30d supply, fill #1
  Filled 2024-06-03: qty 30, 30d supply, fill #2

## 2023-12-18 ENCOUNTER — Other Ambulatory Visit: Payer: Self-pay

## 2023-12-26 ENCOUNTER — Other Ambulatory Visit: Payer: Self-pay

## 2024-01-02 ENCOUNTER — Other Ambulatory Visit: Payer: Self-pay

## 2024-01-05 ENCOUNTER — Ambulatory Visit: Payer: Self-pay | Admitting: Nurse Practitioner

## 2024-01-15 ENCOUNTER — Other Ambulatory Visit: Payer: Self-pay

## 2024-01-16 ENCOUNTER — Ambulatory Visit: Payer: Self-pay | Admitting: Internal Medicine

## 2024-01-16 ENCOUNTER — Ambulatory Visit: Payer: Self-pay | Attending: Nurse Practitioner | Admitting: Nurse Practitioner

## 2024-01-16 ENCOUNTER — Encounter (HOSPITAL_COMMUNITY): Payer: Self-pay | Admitting: *Deleted

## 2024-01-16 ENCOUNTER — Encounter: Payer: Self-pay | Admitting: Nurse Practitioner

## 2024-01-16 ENCOUNTER — Other Ambulatory Visit: Payer: Self-pay

## 2024-01-16 VITALS — BP 152/90 | HR 60 | Resp 19 | Ht 60.0 in | Wt 147.2 lb

## 2024-01-16 DIAGNOSIS — N186 End stage renal disease: Secondary | ICD-10-CM

## 2024-01-16 DIAGNOSIS — Z992 Dependence on renal dialysis: Secondary | ICD-10-CM

## 2024-01-16 DIAGNOSIS — R748 Abnormal levels of other serum enzymes: Secondary | ICD-10-CM

## 2024-01-16 DIAGNOSIS — E1122 Type 2 diabetes mellitus with diabetic chronic kidney disease: Secondary | ICD-10-CM

## 2024-01-16 DIAGNOSIS — I10 Essential (primary) hypertension: Secondary | ICD-10-CM

## 2024-01-16 DIAGNOSIS — E1159 Type 2 diabetes mellitus with other circulatory complications: Secondary | ICD-10-CM

## 2024-01-16 LAB — POCT GLYCOSYLATED HEMOGLOBIN (HGB A1C): HbA1c, POC (controlled diabetic range): 6.8 % (ref 0.0–7.0)

## 2024-01-16 NOTE — Progress Notes (Signed)
 Assessment & Plan:   Brian Fuller was seen today for diabetes.  Diagnoses and all orders for this visit:  Type 2 diabetes mellitus with chronic kidney disease on chronic dialysis, without long-term current use of insulin  (HCC) -     POCT glycosylated hemoglobin (Hb A1C) Continue blood sugar control as discussed in office today, low carbohydrate diet, and regular physical exercise as tolerated, 150 minutes per week (30 min each day, 5 days per week, or 50 min 3 days per week). Keep blood sugar logs with fasting goal of 90-130 mg/dl, post prandial (after you eat) less than 180.  For Hypoglycemia: BS <60 and Hyperglycemia BS >400; contact the clinic ASAP. Annual eye exams and foot exams are recommended.   Hypertension associated with type 2 diabetes mellitus  Likely medication nonadherence Instructed to bring all medications to next office visit for reconciliation  Elevated liver enzymes -     Acute Hep Panel & Hep B Surface Ab -     Cancel: Hepatic function panel; Future -     Hepatic Function Panel    Patient has been counseled on age-appropriate routine health concerns for screening and prevention. These are reviewed and up-to-date. Referrals have been placed accordingly. Immunizations are up-to-date or declined.    Subjective:   Chief Complaint  Patient presents with  . Diabetes    Brian Fuller 73 y.o. male presents to office today for follow up to HTN and DM  He has a past medical history of Anemia, Chronic kidney disease, DM 2, Diabetic retinopathy, ESRD, Glaucoma, Hypertension, Hyperthyroidism, Loss of vision (08/20/2015), Pneumonia, and Systolic murmur    VRI was used to communicate directly with patient for the entire encounter including providing detailed patient instructions.    DM 2 A1c at goal <7.0. He is currently prescribed humalog  75-25 20 units twice per day. He is not taking this as he states he does not like the way it makes him feel. He has not been  taking insulin  for the past 15 days.  States it makes him feel bad, sweating and fatigue.  Lab Results  Component Value Date   HGBA1C 6.8 01/16/2024    Lab Results  Component Value Date   HGBA1C 7.8 (A) 09/29/2023     HTN He is currently prescribed losartan  25 mg daily and amlodipine  10 mg daily. Also furosemide  20 mg daily prn for BLE swelling, He does not have his medications with him for medication reconciliation. His amlodipine  is showing as never dispended from March although he endorses medication adherence.  BP Readings from Last 3 Encounters:  01/16/24 (!) 152/90  11/10/23 (!) 156/61  10/20/23 (!) 134/59     He experiences infrequent low back pain. He takes tylenol  for his pain and this does provide relier. There is no radiation of pain into the BLE.    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 Is followed by the retinal specialist   Review of Systems  Constitutional:  Negative for fever, malaise/fatigue and weight loss.  HENT: Negative.  Negative for nosebleeds.   Eyes: Negative.  Negative for blurred vision, double vision and photophobia.       Watery eyes  Respiratory: Negative.  Negative for cough and shortness of breath.   Cardiovascular: Negative.  Negative for chest pain, palpitations and leg swelling.  Gastrointestinal: Negative.  Negative for heartburn, nausea and vomiting.  Musculoskeletal: Negative.  Negative for  myalgias.  Neurological: Negative.  Negative for dizziness, focal weakness, seizures and headaches.  Psychiatric/Behavioral: Negative.  Negative for suicidal ideas.     Past Medical History:  Diagnosis Date  . Anemia   . Chronic kidney disease    dialysis, T/Th/Sat  . Diabetes mellitus without complication (HCC)   . Diabetic retinopathy (HCC)   . ESRD (end stage renal disease) (HCC)   . Glaucoma   . Hypertension   . Hyperthyroidism   . Loss of  vision 08/20/2015   Rt eye   . Pneumonia   . Systolic murmur     Past Surgical History:  Procedure Laterality Date  . A/V FISTULAGRAM Left 10/16/2022   Procedure: A/V Fistulagram;  Surgeon: Kayla Part, MD;  Location: [facility] INVASIVE CV LAB;  Service: Cardiovascular;  Laterality: Left;  . A/V FISTULAGRAM Left 04/16/2023   Procedure: A/V Fistulagram;  Surgeon: Kayla Part, MD;  Location: [facility] INVASIVE CV LAB;  Service: Cardiovascular;  Laterality: Left;  . A/V FISTULAGRAM N/A 10/20/2023   Procedure: A/V Fistulagram;  Surgeon: Patrick Boor, MD;  Location: [facility] INVASIVE CV LAB;  Service: Cardiovascular;  Laterality: N/A;  . AMPUTATION TOE Left 09/05/2021   Procedure: AMPUTATION Left Second Toe;  Surgeon: Murleen Arms, MD;  Location: [facility] OR;  Service: Orthopedics;  Laterality: Left;  . AV FISTULA PLACEMENT Left 11/02/2014   Procedure: ARTERIOVENOUS (AV) FISTULA CREATION;  Surgeon: Palma Bob, MD;  Location: [facility] OR;  Service: Vascular;  Laterality: Left;  . AV FISTULA PLACEMENT Left 04/26/2015   Procedure: LEFT BRACHIOCEPHALIC ARTERIOVENOUS (AV) FISTULA CREATION;  Surgeon: Arvil Lauber, MD;  Location: [facility] OR;  Service: Vascular;  Laterality: Left;  . EYE SURGERY    . INSERTION OF DIALYSIS CATHETER Right 11/04/2014   Procedure: INSERTION OF DIALYSIS CATHETER RIGHT INTERNAL JUGULAR VEIN;  Surgeon: Palma Bob, MD;  Location: [facility] OR;  Service: Vascular;  Laterality: Right;  . IR AV DIALY SHUNT INTRO NEEDLE/INTRACATH INITIAL W/PTA/IMG LEFT  06/18/2023  . IR US  GUIDE VASC ACCESS LEFT  06/18/2023  . LIGATION OF ARTERIOVENOUS  FISTULA Left 04/26/2015   Procedure: LIGATION OF LEFT RADIOCEPHALIC ARTERIOVENOUS  FISTULA;  Surgeon: Arvil Lauber, MD;  Location: [facility] OR;  Service: Vascular;  Laterality: Left;  . PERIPHERAL VASCULAR BALLOON ANGIOPLASTY  04/16/2023   Procedure: PERIPHERAL VASCULAR BALLOON ANGIOPLASTY;  Surgeon: Kayla Part, MD;  Location: [facility] INVASIVE CV LAB;  Service: Cardiovascular;;  .  PERIPHERAL VASCULAR BALLOON ANGIOPLASTY  10/20/2023   Procedure: PERIPHERAL VASCULAR BALLOON ANGIOPLASTY;  Surgeon: Patrick Boor, MD;  Location: [facility] INVASIVE CV LAB;  Service: Cardiovascular;;  80 % Arch  . PERIPHERAL VASCULAR INTERVENTION Left 10/16/2022   Procedure: PERIPHERAL VASCULAR INTERVENTION;  Surgeon: Kayla Part, MD;  Location: [facility] INVASIVE CV LAB;  Service: Cardiovascular;  Laterality: Left;  left AV fistula  . REVISON OF ARTERIOVENOUS FISTULA Left 01/18/2015   Procedure: REVISON OF LEFT RADIOCEPHALIC ARTERIOVENOUS FISTULA USING VASCU-GUARD PERIPHERAL VASCULAR PATCH ;  Surgeon: Palma Bob, MD;  Location: [facility] OR;  Service: Vascular;  Laterality: Left;  . TONSILLECTOMY      Family History  Problem Relation Age of Onset  . Diabetes Mother   . Diabetes Father   . Diabetes Brother   . Hypertension Sister     Social History Reviewed with no changes to be made today.   Outpatient Medications Prior to Visit  Medication Sig Dispense Refill  . amLODipine  (NORVASC ) 10 MG tablet Take 1 tablet (10 mg  total) by mouth daily. para la presi n arterial. 90 tablet 1  . atorvastatin  (LIPITOR) 20 MG tablet Take 1 tablet (20 mg total) by mouth daily. 90 tablet 1  . Blood Glucose Monitoring Suppl (TRUE METRIX METER) w/Device KIT Use as instructed. Check blood glucose level by fingerstick twice per day. 1 kit 0  . carvedilol  (COREG ) 12.5 MG tablet Take 1 tablet (12.5 mg total) by mouth 2 (two) times daily with a meal. 180 tablet 1  . cinacalcet  (SENSIPAR ) 30 MG tablet Take 1 tablet by mouth once a day Take with largest meal 90 tablet 3  . cinacalcet  (SENSIPAR ) 60 MG tablet Take 1 tablet (60 mg total) by mouth daily.Take with largest meal. 90 tablet 3  . ferric citrate (AURYXIA ) 1 GM 210 MG(Fe) tablet Take 2 tablets (420 mg total) by mouth 3 (three) times daily with meals. (Patient not taking: Reported on 10/15/2023) 180 tablet 11  . ferric citrate (AURYXIA ) 1 GM 210 MG(Fe) tablet Take 8 tablet by  mouth every 24 hours with meals Take 2 tablets three times a day with meals and 1 tablet two times a day with snacks) (Patient not taking: Reported on 11/10/2023) 720 tablet 3  . furosemide  (LASIX ) 20 MG tablet Take 1 tablet (20 mg total) by mouth daily. Only take as needed for swelling in lower extremity 30 tablet 3  . furosemide  (LASIX ) 20 MG tablet Take 1 tablet (20 mg total) by mouth daily as needed. Only take as needed for swelling in lower extremity 30 tablet 0  . gabapentin  (NEURONTIN ) 100 MG capsule Take 1 capsule (100 mg total) by mouth at bedtime. For neuropathy 90 capsule 3  . Glucose 2 g CHEW Administer per instructions 30 tablet 1  . glucose blood (TRUE METRIX BLOOD GLUCOSE TEST) test strip Use as instructed. Check blood glucose level by fingerstick twice per day. 100 each 12  . Insulin  Lispro Prot & Lispro (HUMALOG  MIX 75/25 KWIKPEN) (75-25) 100 UNIT/ML Kwikpen Inject 20 Units into the skin in the morning and at bedtime. (Patient taking differently: Inject 14 Units into the skin daily.) 45 mL 6  . Insulin  Pen Needle (B-D UF III MINI PEN NEEDLES) 31G X 5 MM MISC Use as instructed. Inject into the skin once daily 100 each 3  . JANUVIA  25 MG tablet Take 1 tablet (25 mg total) by mouth daily. 90 tablet 0  . losartan  (COZAAR ) 25 MG tablet Take 1 tablet (25 mg total) by mouth daily. para la presi n arterial 90 tablet 1  . sodium zirconium cyclosilicate  (LOKELMA ) 10 g PACK packet Take 1 packet by mouth three times a week Mix powder with 45ml water and drink. Please take three times weekly 13 packet 2  . TRUEplus Lancets 28G MISC Use as instructed. Check blood glucose level by fingerstick twice per day 100 each 3   Facility-Administered Medications Prior to Visit  Medication Dose Route Frequency Provider Last Rate Last Admin  . 0.9 %  sodium chloride  infusion  250 mL Intravenous PRN Robins, Joshua E, MD      . 0.9 %  sodium chloride  infusion  250 mL Intravenous PRN Robins, Joshua E, MD      .  sodium chloride  flush (NS) 0.9 % injection 3 mL  3 mL Intravenous Q12H Robins, Joshua E, MD      . sodium chloride  flush (NS) 0.9 % injection 3 mL  3 mL Intravenous Q12H Robins, Joshua E, MD      .  sodium chloride  flush (NS) 0.9 % injection 3 mL  3 mL Intravenous PRN Robins, Joshua E, MD        Allergies  Allergen Reactions  . Metformin And Related Other (See Comments)    Metformin contraindication in ESRD Dizziness       Objective:    BP (!) 152/90 (BP Location: Left Arm, Patient Position: Sitting, Cuff Size: Normal)   Pulse 60   Resp 19   Ht 5' (1.524 m)   Wt 147 lb 3.2 oz (66.8 kg)   SpO2 100%   BMI 28.75 kg/m  Wt Readings from Last 3 Encounters:  01/16/24 147 lb 3.2 oz (66.8 kg)  11/10/23 151 lb 12.8 oz (68.9 kg)  09/29/23 153 lb 3.2 oz (69.5 kg)    Physical Exam Vitals and nursing note reviewed.  Constitutional:      Appearance: He is well-developed.  HENT:     Head: Normocephalic and atraumatic.  Cardiovascular:     Rate and Rhythm: Normal rate and regular rhythm.     Heart sounds: Normal heart sounds. No murmur heard.    No friction rub. No gallop.  Pulmonary:     Effort: Pulmonary effort is normal. No tachypnea or respiratory distress.     Breath sounds: Normal breath sounds. No decreased breath sounds, wheezing, rhonchi or rales.  Chest:     Chest wall: No tenderness.  Abdominal:     General: Bowel sounds are normal.     Palpations: Abdomen is soft.  Musculoskeletal:        General: Normal range of motion.     Cervical back: Normal range of motion.  Skin:    General: Skin is warm and dry.  Neurological:     Mental Status: He is alert and oriented to person, place, and time.     Coordination: Coordination normal.  Psychiatric:        Behavior: Behavior normal. Behavior is cooperative.        Thought Content: Thought content normal.        Judgment: Judgment normal.         Patient has been counseled extensively about nutrition and exercise as  well as the importance of adherence with medications and regular follow-up. The patient was given clear instructions to go to ER or return to medical center if symptoms don't improve, worsen or new problems develop. The patient verbalized understanding.   Follow-up: Return in about 3 months (around 04/17/2024).   Collins Dean, FNP-BC [facility] [place], [place] [phone]   01/17/2024, 11:17 PM

## 2024-01-17 ENCOUNTER — Encounter: Payer: Self-pay | Admitting: Nurse Practitioner

## 2024-01-17 ENCOUNTER — Ambulatory Visit: Payer: Self-pay | Admitting: Nurse Practitioner

## 2024-01-17 DIAGNOSIS — R7989 Other specified abnormal findings of blood chemistry: Secondary | ICD-10-CM

## 2024-01-17 LAB — ACUTE HEP PANEL AND HEP B SURFACE AB
Hep A IgM: NEGATIVE
Hep B C IgM: NEGATIVE
Hep C Virus Ab: NONREACTIVE
Hepatitis B Surf Ab Quant: 24 m[IU]/mL
Hepatitis B Surface Ag: NEGATIVE

## 2024-01-17 LAB — HEPATIC FUNCTION PANEL
ALT: 23 IU/L (ref 0–44)
AST: 12 IU/L (ref 0–40)
Albumin: 4.7 g/dL (ref 3.8–4.8)
Alkaline Phosphatase: 238 IU/L — ABNORMAL HIGH (ref 44–121)
Bilirubin Total: 0.4 mg/dL (ref 0.0–1.2)
Bilirubin, Direct: 0.19 mg/dL (ref 0.00–0.40)
Total Protein: 7.7 g/dL (ref 6.0–8.5)

## 2024-01-19 ENCOUNTER — Other Ambulatory Visit: Payer: Self-pay

## 2024-02-02 ENCOUNTER — Other Ambulatory Visit: Payer: Self-pay

## 2024-02-13 ENCOUNTER — Other Ambulatory Visit: Payer: Self-pay

## 2024-02-24 ENCOUNTER — Other Ambulatory Visit: Payer: Self-pay

## 2024-02-24 ENCOUNTER — Encounter (HOSPITAL_COMMUNITY): Payer: Self-pay | Admitting: *Deleted

## 2024-02-24 ENCOUNTER — Other Ambulatory Visit: Payer: Self-pay | Admitting: Nurse Practitioner

## 2024-02-24 DIAGNOSIS — I152 Hypertension secondary to endocrine disorders: Secondary | ICD-10-CM

## 2024-02-24 DIAGNOSIS — E1140 Type 2 diabetes mellitus with diabetic neuropathy, unspecified: Secondary | ICD-10-CM

## 2024-02-24 MED ORDER — LOSARTAN POTASSIUM 25 MG PO TABS
25.0000 mg | ORAL_TABLET | Freq: Every day | ORAL | 1 refills | Status: DC
  Filled 2024-02-24: qty 90, 90d supply, fill #0
  Filled 2024-04-06 – 2024-06-03 (×2): qty 90, 90d supply, fill #1

## 2024-02-24 MED ORDER — GABAPENTIN 100 MG PO CAPS
100.0000 mg | ORAL_CAPSULE | Freq: Every day | ORAL | 3 refills | Status: AC
  Filled 2024-02-24: qty 90, 90d supply, fill #0
  Filled 2024-04-06 – 2024-06-03 (×2): qty 90, 90d supply, fill #1
  Filled 2024-09-10: qty 90, 90d supply, fill #2

## 2024-02-26 ENCOUNTER — Other Ambulatory Visit: Payer: Self-pay

## 2024-03-03 ENCOUNTER — Other Ambulatory Visit: Payer: Self-pay

## 2024-03-03 ENCOUNTER — Encounter (HOSPITAL_COMMUNITY): Payer: Self-pay | Admitting: *Deleted

## 2024-04-06 ENCOUNTER — Other Ambulatory Visit: Payer: Self-pay

## 2024-04-06 ENCOUNTER — Encounter (HOSPITAL_COMMUNITY): Payer: Self-pay | Admitting: *Deleted

## 2024-04-12 ENCOUNTER — Other Ambulatory Visit: Payer: Self-pay

## 2024-04-16 ENCOUNTER — Telehealth: Payer: Self-pay | Admitting: Nurse Practitioner

## 2024-04-16 NOTE — Telephone Encounter (Signed)
 Pt unconfirmed appt 8/29 lvm

## 2024-04-20 ENCOUNTER — Ambulatory Visit: Payer: Self-pay | Attending: Nurse Practitioner | Admitting: Nurse Practitioner

## 2024-04-20 ENCOUNTER — Other Ambulatory Visit: Payer: Self-pay

## 2024-04-20 ENCOUNTER — Encounter: Payer: Self-pay | Admitting: Nurse Practitioner

## 2024-04-20 VITALS — BP 171/71 | HR 68 | Resp 19 | Ht 60.0 in | Wt 146.1 lb

## 2024-04-20 DIAGNOSIS — E1122 Type 2 diabetes mellitus with diabetic chronic kidney disease: Secondary | ICD-10-CM

## 2024-04-20 DIAGNOSIS — I10 Essential (primary) hypertension: Secondary | ICD-10-CM

## 2024-04-20 DIAGNOSIS — Z992 Dependence on renal dialysis: Secondary | ICD-10-CM

## 2024-04-20 DIAGNOSIS — E785 Hyperlipidemia, unspecified: Secondary | ICD-10-CM

## 2024-04-20 DIAGNOSIS — N186 End stage renal disease: Secondary | ICD-10-CM

## 2024-04-20 DIAGNOSIS — E1169 Type 2 diabetes mellitus with other specified complication: Secondary | ICD-10-CM

## 2024-04-20 DIAGNOSIS — Z125 Encounter for screening for malignant neoplasm of prostate: Secondary | ICD-10-CM

## 2024-04-20 DIAGNOSIS — Z794 Long term (current) use of insulin: Secondary | ICD-10-CM

## 2024-04-20 MED ORDER — CARVEDILOL 12.5 MG PO TABS
12.5000 mg | ORAL_TABLET | Freq: Two times a day (BID) | ORAL | 1 refills | Status: AC
  Filled 2024-04-20 – 2024-09-10 (×2): qty 180, 90d supply, fill #0

## 2024-04-20 MED ORDER — ATORVASTATIN CALCIUM 20 MG PO TABS
20.0000 mg | ORAL_TABLET | Freq: Every day | ORAL | 1 refills | Status: AC
  Filled 2024-04-20 – 2024-06-03 (×2): qty 90, 90d supply, fill #0
  Filled 2024-09-10: qty 90, 90d supply, fill #1

## 2024-04-20 NOTE — Progress Notes (Signed)
 Assessment & Plan:  Brian Fuller was seen today for hypertension.  Diagnoses and all orders for this visit:  Primary hypertension -     carvedilol  (COREG ) 12.5 MG tablet; Take 1 tablet (12.5 mg total) by mouth 2 (two) times daily with a meal. Blood pressure elevated. Medications low, require refills. - Renew blood pressure medication refills.  Type 2 diabetes mellitus with chronic kidney disease on chronic dialysis, without long-term current use of insulin  (HCC) -     Hemoglobin A1c Using 14 units insulin  daily. A1c normal in May. - Continue 14 units insulin  daily. - Check A1c today.   Hyperlipidemia associated with type 2 diabetes mellitus (HCC) -     atorvastatin  (LIPITOR) 20 MG tablet; Take 1 tablet (20 mg total) by mouth daily.  Prostate cancer screening -     PSA Screening due. No prior prostate blood work.    Patient has been counseled on age-appropriate routine health concerns for screening and prevention. These are reviewed and up-to-date. Referrals have been placed accordingly. Immunizations are up-to-date or declined.    Subjective:   Chief Complaint  Patient presents with   Hypertension   History of Present Illness Brian Fuller is a 73 year old male with hypertension who presents for blood pressure management and medication review.  VRI was used to communicate directly with patient for the entire encounter including providing detailed patient instructions.    HTN  Blood pressure is elevated today. Brian Fuller does not monitor his blood pressure at home but states it is checked at the dialysis center. Brian Fuller can not recall any readings.  BP Readings from Last 3 Encounters:  04/20/24 (!) 171/71  01/16/24 (!) 152/90  11/10/23 (!) 156/61     DM 2 A1c at goal.  Brian Fuller is currently on insulin  therapy, administering 14 units once daily in the morning instead of 20 units BID. Brian Fuller previously used 20 units but reduced the dose due to feeling 'very bad'.  Lab Results   Component Value Date   HGBA1C 6.8 01/16/2024      Review of Systems  Constitutional:  Negative for fever, malaise/fatigue and weight loss.  HENT: Negative.  Negative for nosebleeds.   Eyes: Negative.  Negative for blurred vision, double vision and photophobia.  Respiratory: Negative.  Negative for cough and shortness of breath.   Cardiovascular: Negative.  Negative for chest pain, palpitations and leg swelling.  Gastrointestinal: Negative.  Negative for heartburn, nausea and vomiting.  Musculoskeletal: Negative.  Negative for myalgias.  Neurological: Negative.  Negative for dizziness, focal weakness, seizures and headaches.  Psychiatric/Behavioral: Negative.  Negative for suicidal ideas.     Past Medical History:  Diagnosis Date   Anemia    Chronic kidney disease    dialysis, T/Th/Sat   Diabetes mellitus without complication (HCC)    Diabetic retinopathy (HCC)    ESRD (end stage renal disease) (HCC)    Glaucoma    Hypertension    Hyperthyroidism    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    Pneumonia    Systolic murmur     Past Surgical History:  Procedure Laterality Date   A/V FISTULAGRAM Left 10/16/2022   Procedure: A/V Fistulagram;  Surgeon: Lanis Fonda BRAVO, MD;  Location: [facility] INVASIVE CV LAB;  Service: Cardiovascular;  Laterality: Left;   A/V FISTULAGRAM Left 04/16/2023   Procedure: A/V Fistulagram;  Surgeon: Lanis Fonda BRAVO, MD;  Location: [facility] INVASIVE CV LAB;  Service: Cardiovascular;  Laterality: Left;   A/V FISTULAGRAM N/A  10/20/2023   Procedure: A/V Fistulagram;  Surgeon: Melia Lynwood ORN, MD;  Location: [facility] INVASIVE CV LAB;  Service: Cardiovascular;  Laterality: N/A;   AMPUTATION TOE Left 09/05/2021   Procedure: AMPUTATION Left Second Toe;  Surgeon: Edna Toribio LABOR, MD;  Location: [facility] OR;  Service: Orthopedics;  Laterality: Left;   AV FISTULA PLACEMENT Left 11/02/2014   Procedure: ARTERIOVENOUS (AV) FISTULA CREATION;  Surgeon: Lynwood JONETTA Collum, MD;  Location: [facility] OR;   Service: Vascular;  Laterality: Left;   AV FISTULA PLACEMENT Left 04/26/2015   Procedure: LEFT BRACHIOCEPHALIC ARTERIOVENOUS (AV) FISTULA CREATION;  Surgeon: Redell LITTIE Door, MD;  Location: [facility] OR;  Service: Vascular;  Laterality: Left;   EYE SURGERY     INSERTION OF DIALYSIS CATHETER Right 11/04/2014   Procedure: INSERTION OF DIALYSIS CATHETER RIGHT INTERNAL JUGULAR VEIN;  Surgeon: Lynwood JONETTA Collum, MD;  Location: [facility] OR;  Service: Vascular;  Laterality: Right;   IR AV DIALY SHUNT INTRO NEEDLE/INTRACATH INITIAL W/PTA/IMG LEFT  06/18/2023   IR US  GUIDE VASC ACCESS LEFT  06/18/2023   LIGATION OF ARTERIOVENOUS  FISTULA Left 04/26/2015   Procedure: LIGATION OF LEFT RADIOCEPHALIC ARTERIOVENOUS  FISTULA;  Surgeon: Redell LITTIE Door, MD;  Location: [facility] OR;  Service: Vascular;  Laterality: Left;   PERIPHERAL VASCULAR BALLOON ANGIOPLASTY  04/16/2023   Procedure: PERIPHERAL VASCULAR BALLOON ANGIOPLASTY;  Surgeon: Lanis Fonda BRAVO, MD;  Location: [facility] INVASIVE CV LAB;  Service: Cardiovascular;;   PERIPHERAL VASCULAR BALLOON ANGIOPLASTY  10/20/2023   Procedure: PERIPHERAL VASCULAR BALLOON ANGIOPLASTY;  Surgeon: Melia Lynwood ORN, MD;  Location: [facility] INVASIVE CV LAB;  Service: Cardiovascular;;  80 % Arch   PERIPHERAL VASCULAR INTERVENTION Left 10/16/2022   Procedure: PERIPHERAL VASCULAR INTERVENTION;  Surgeon: Lanis Fonda BRAVO, MD;  Location: [facility] INVASIVE CV LAB;  Service: Cardiovascular;  Laterality: Left;  left AV fistula   REVISON OF ARTERIOVENOUS FISTULA Left 01/18/2015   Procedure: REVISON OF LEFT RADIOCEPHALIC ARTERIOVENOUS FISTULA USING VASCU-GUARD PERIPHERAL VASCULAR PATCH ;  Surgeon: Lynwood JONETTA Collum, MD;  Location: [facility] OR;  Service: Vascular;  Laterality: Left;   TONSILLECTOMY      Family History  Problem Relation Age of Onset   Diabetes Mother    Diabetes Father    Diabetes Brother    Hypertension Sister     Social History Reviewed with no changes to be made today.   Outpatient Medications Prior to Visit  Medication Sig  Dispense Refill   amLODipine  (NORVASC ) 10 MG tablet Take 1 tablet (10 mg total) by mouth daily. para la presi n arterial. 90 tablet 1   Blood Glucose Monitoring Suppl (TRUE METRIX METER) w/Device KIT Use as instructed. Check blood glucose level by fingerstick twice per day. 1 kit 0   cinacalcet  (SENSIPAR ) 30 MG tablet Take 1 tablet by mouth once a day Take with largest meal 90 tablet 3   cinacalcet  (SENSIPAR ) 60 MG tablet Take 1 tablet (60 mg total) by mouth daily.Take with largest meal. 90 tablet 3   furosemide  (LASIX ) 20 MG tablet Take 1 tablet (20 mg total) by mouth daily. Only take as needed for swelling in lower extremity 30 tablet 3   furosemide  (LASIX ) 20 MG tablet Take 1 tablet (20 mg total) by mouth daily as needed. Only take as needed for swelling in lower extremity 30 tablet 0   gabapentin  (NEURONTIN ) 100 MG capsule Take 1 capsule (100 mg total) by mouth at bedtime. For neuropathy 90 capsule 3   Glucose 2 g CHEW Administer per instructions 30 tablet  1   glucose blood (TRUE METRIX BLOOD GLUCOSE TEST) test strip Use as instructed. Check blood glucose level by fingerstick twice per day. 100 each 12   Insulin  Lispro Prot & Lispro (HUMALOG  MIX 75/25 KWIKPEN) (75-25) 100 UNIT/ML Kwikpen Inject 20 Units into the skin in the morning and at bedtime. (Patient taking differently: Inject 14 Units into the skin daily.) 45 mL 6   Insulin  Pen Needle (B-D UF III MINI PEN NEEDLES) 31G X 5 MM MISC Use as instructed. Inject into the skin once daily 100 each 3   JANUVIA  25 MG tablet Take 1 tablet (25 mg total) by mouth daily. 90 tablet 0   losartan  (COZAAR ) 25 MG tablet Take 1 tablet (25 mg total) by mouth daily. para la presi n arterial 90 tablet 1   sodium zirconium cyclosilicate  (LOKELMA ) 10 g PACK packet Take 1 packet by mouth three times a week Mix powder with 45ml water and drink. Please take three times weekly 13 packet 2   TRUEplus Lancets 28G MISC Use as instructed. Check blood glucose level by  fingerstick twice per day 100 each 3   atorvastatin  (LIPITOR) 20 MG tablet Take 1 tablet (20 mg total) by mouth daily. 90 tablet 1   carvedilol  (COREG ) 12.5 MG tablet Take 1 tablet (12.5 mg total) by mouth 2 (two) times daily with a meal. 180 tablet 1   ferric citrate  (AURYXIA ) 1 GM 210 MG(Fe) tablet Take 2 tablets (420 mg total) by mouth 3 (three) times daily with meals. (Patient not taking: Reported on 10/15/2023) 180 tablet 11   ferric citrate  (AURYXIA ) 1 GM 210 MG(Fe) tablet Take 8 tablet by mouth every 24 hours with meals Take 2 tablets three times a day with meals and 1 tablet two times a day with snacks) (Patient not taking: Reported on 11/10/2023) 720 tablet 3   Facility-Administered Medications Prior to Visit  Medication Dose Route Frequency Provider Last Rate Last Admin   0.9 %  sodium chloride  infusion  250 mL Intravenous PRN Robins, Joshua E, MD       0.9 %  sodium chloride  infusion  250 mL Intravenous PRN Robins, Joshua E, MD       sodium chloride  flush (NS) 0.9 % injection 3 mL  3 mL Intravenous Q12H Robins, Joshua E, MD       sodium chloride  flush (NS) 0.9 % injection 3 mL  3 mL Intravenous Q12H Robins, Joshua E, MD       sodium chloride  flush (NS) 0.9 % injection 3 mL  3 mL Intravenous PRN Robins, Joshua E, MD        Allergies  Allergen Reactions   Metformin And Related Other (See Comments)    Metformin contraindication in ESRD Dizziness       Objective:    BP (!) 171/71 (BP Location: Right Arm, Patient Position: Sitting, Cuff Size: Normal)   Pulse 68   Resp 19   Ht 5' (1.524 m)   Wt 146 lb 1.6 oz (66.3 kg)   SpO2 100%   BMI 28.53 kg/m  Wt Readings from Last 3 Encounters:  04/20/24 146 lb 1.6 oz (66.3 kg)  01/16/24 147 lb 3.2 oz (66.8 kg)  11/10/23 151 lb 12.8 oz (68.9 kg)    Physical Exam Vitals and nursing note reviewed.  Constitutional:      Appearance: Brian Fuller is well-developed.  HENT:     Head: Normocephalic and atraumatic.  Cardiovascular:     Rate and  Rhythm: Normal rate  and regular rhythm.     Heart sounds: Normal heart sounds. No murmur heard.    No friction rub. No gallop.  Pulmonary:     Effort: Pulmonary effort is normal. No tachypnea or respiratory distress.     Breath sounds: Normal breath sounds. No decreased breath sounds, wheezing, rhonchi or rales.  Chest:     Chest wall: No tenderness.  Musculoskeletal:        General: Normal range of motion.     Cervical back: Normal range of motion.  Skin:    General: Skin is warm and dry.  Neurological:     Mental Status: Brian Fuller is alert and oriented to person, place, and time.     Coordination: Coordination normal.  Psychiatric:        Behavior: Behavior normal. Behavior is cooperative.        Thought Content: Thought content normal.        Judgment: Judgment normal.          Patient has been counseled extensively about nutrition and exercise as well as the importance of adherence with medications and regular follow-up. The patient was given clear instructions to go to ER or return to medical center if symptoms don't improve, worsen or new problems develop. The patient verbalized understanding.   Follow-up: Return in about 3 months (around 07/20/2024).   Haze LELON Servant, FNP-BC [facility] [place], [place] [phone]   04/20/2024, 10:09 PM

## 2024-04-21 ENCOUNTER — Ambulatory Visit (HOSPITAL_COMMUNITY): Admission: RE | Admit: 2024-04-21 | Payer: Self-pay | Source: Home / Self Care | Admitting: Vascular Surgery

## 2024-04-21 ENCOUNTER — Encounter (HOSPITAL_COMMUNITY): Admission: RE | Payer: Self-pay | Source: Home / Self Care

## 2024-04-21 LAB — PSA: Prostate Specific Ag, Serum: 0.5 ng/mL (ref 0.0–4.0)

## 2024-04-21 LAB — HEMOGLOBIN A1C
Est. average glucose Bld gHb Est-mCnc: 166 mg/dL
Hgb A1c MFr Bld: 7.4 % — ABNORMAL HIGH (ref 4.8–5.6)

## 2024-04-21 SURGERY — A/V SHUNT INTERVENTION
Anesthesia: LOCAL | Site: Arm Upper | Laterality: Left

## 2024-04-24 ENCOUNTER — Other Ambulatory Visit: Payer: Self-pay

## 2024-04-24 ENCOUNTER — Encounter (HOSPITAL_COMMUNITY): Payer: Self-pay | Admitting: *Deleted

## 2024-04-24 ENCOUNTER — Ambulatory Visit: Payer: Self-pay | Admitting: Nurse Practitioner

## 2024-04-24 DIAGNOSIS — E1122 Type 2 diabetes mellitus with diabetic chronic kidney disease: Secondary | ICD-10-CM

## 2024-04-24 MED ORDER — JANUVIA 25 MG PO TABS
25.0000 mg | ORAL_TABLET | Freq: Every day | ORAL | 0 refills | Status: AC
  Filled 2024-04-24: qty 90, 90d supply, fill #0

## 2024-04-26 ENCOUNTER — Other Ambulatory Visit: Payer: Self-pay

## 2024-05-03 ENCOUNTER — Ambulatory Visit (HOSPITAL_COMMUNITY)
Admission: RE | Admit: 2024-05-03 | Discharge: 2024-05-03 | Disposition: A | Discharge status: Home / Self Care | Payer: Self-pay | Attending: Vascular Surgery | Admitting: Vascular Surgery

## 2024-05-03 ENCOUNTER — Encounter (HOSPITAL_COMMUNITY): Payer: Self-pay | Admitting: *Deleted

## 2024-05-03 ENCOUNTER — Other Ambulatory Visit: Payer: Self-pay

## 2024-05-03 ENCOUNTER — Other Ambulatory Visit (HOSPITAL_COMMUNITY): Payer: Self-pay

## 2024-05-03 ENCOUNTER — Encounter (HOSPITAL_COMMUNITY)
Admission: RE | Disposition: A | Discharge status: Home / Self Care | Payer: Self-pay | Source: Home / Self Care | Attending: Vascular Surgery

## 2024-05-03 DIAGNOSIS — I120 Hypertensive chronic kidney disease with stage 5 chronic kidney disease or end stage renal disease: Secondary | ICD-10-CM | POA: Insufficient documentation

## 2024-05-03 DIAGNOSIS — N186 End stage renal disease: Secondary | ICD-10-CM | POA: Insufficient documentation

## 2024-05-03 DIAGNOSIS — T82898A Other specified complication of vascular prosthetic devices, implants and grafts, initial encounter: Secondary | ICD-10-CM

## 2024-05-03 DIAGNOSIS — Z87891 Personal history of nicotine dependence: Secondary | ICD-10-CM | POA: Insufficient documentation

## 2024-05-03 DIAGNOSIS — E1122 Type 2 diabetes mellitus with diabetic chronic kidney disease: Secondary | ICD-10-CM | POA: Insufficient documentation

## 2024-05-03 DIAGNOSIS — T82838A Hemorrhage of vascular prosthetic devices, implants and grafts, initial encounter: Secondary | ICD-10-CM | POA: Insufficient documentation

## 2024-05-03 DIAGNOSIS — Y832 Surgical operation with anastomosis, bypass or graft as the cause of abnormal reaction of the patient, or of later complication, without mention of misadventure at the time of the procedure: Secondary | ICD-10-CM | POA: Insufficient documentation

## 2024-05-03 DIAGNOSIS — T82858A Stenosis of vascular prosthetic devices, implants and grafts, initial encounter: Secondary | ICD-10-CM | POA: Insufficient documentation

## 2024-05-03 DIAGNOSIS — Z992 Dependence on renal dialysis: Secondary | ICD-10-CM | POA: Insufficient documentation

## 2024-05-03 DIAGNOSIS — T82510A Breakdown (mechanical) of surgically created arteriovenous fistula, initial encounter: Secondary | ICD-10-CM | POA: Insufficient documentation

## 2024-05-03 LAB — GLUCOSE, CAPILLARY: Glucose-Capillary: 154 mg/dL — ABNORMAL HIGH (ref 70–99)

## 2024-05-03 SURGERY — A/V SHUNT INTERVENTION
Anesthesia: LOCAL

## 2024-05-03 MED ORDER — CLOPIDOGREL BISULFATE 75 MG PO TABS
75.0000 mg | ORAL_TABLET | Freq: Every day | ORAL | 1 refills | Status: AC
  Filled 2024-05-03: qty 90, 90d supply, fill #0
  Filled 2024-09-10: qty 90, 90d supply, fill #1

## 2024-05-03 MED ORDER — LIDOCAINE HCL (PF) 1 % IJ SOLN
INTRAMUSCULAR | Status: AC
  Filled 2024-05-03: qty 30

## 2024-05-03 MED ORDER — LIDOCAINE HCL (PF) 1 % IJ SOLN
INTRAMUSCULAR | Status: DC | PRN
  Administered 2024-05-03: 5 mL

## 2024-05-03 MED ORDER — IODIXANOL 320 MG/ML IV SOLN
INTRAVENOUS | Status: DC | PRN
  Administered 2024-05-03: 60 mL via INTRA_ARTERIAL

## 2024-05-03 MED ORDER — ASPIRIN 81 MG PO TBEC
81.0000 mg | DELAYED_RELEASE_TABLET | Freq: Every day | ORAL | 2 refills | Status: AC
  Filled 2024-05-03: qty 150, 150d supply, fill #0

## 2024-05-03 MED ORDER — HEPARIN (PORCINE) IN NACL 1000-0.9 UT/500ML-% IV SOLN
INTRAVENOUS | Status: DC | PRN
  Administered 2024-05-03: 500 mL via SURGICAL_CAVITY

## 2024-05-03 SURGICAL SUPPLY — 11 items
BALLOON MUSTANG 7X80X75 (BALLOONS) IMPLANT
GUIDEWIRE ANGLED .035X150CM (WIRE) IMPLANT
KIT ENCORE 26 ADVANTAGE (KITS) IMPLANT
KIT MICROPUNCTURE NIT STIFF (SHEATH) IMPLANT
KIT PV (KITS) ×1 IMPLANT
SHEATH PINNACLE 8F 10CM (SHEATH) IMPLANT
SHEATH PINNACLE R/O II 7F 4CM (SHEATH) IMPLANT
SHEATH PROBE COVER 6X72 (BAG) IMPLANT
STENT VIABAHN 8X50X75 (Permanent Stent) IMPLANT
TRAY PV CATH (CUSTOM PROCEDURE TRAY) ×1 IMPLANT
TUBING CIL FLEX 10 FLL-RA (TUBING) IMPLANT

## 2024-05-03 NOTE — H&P (Signed)
 [facility] [place]  ASSESSMENT / PLAN: 73 y.o. male with pulsatile left arm brachiocephalic AVF that is having prolonged bleeding after dialysis. Plan fistulagram today to evaluate.  CHIEF COMPLAINT: ESRD on HD  HISTORY OF PRESENT ILLNESS: Brian Fuller is a 73 y.o. male with ESRD on HD via LUE BC AVF. The fistula is hyperpulsatile and bleeding for longer than anticipated at dialysis. He has no complaints. History obtained with assistance of in-person translator.  Past Medical History:  Diagnosis Date   Anemia    Chronic kidney disease    dialysis, T/Th/Sat   Diabetes mellitus without complication (HCC)    Diabetic retinopathy (HCC)    ESRD (end stage renal disease) (HCC)    Glaucoma    Hypertension    Hyperthyroidism    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    Pneumonia    Systolic murmur     Past Surgical History:  Procedure Laterality Date   A/V FISTULAGRAM Left 10/16/2022   Procedure: A/V Fistulagram;  Surgeon: Lanis Fonda BRAVO, MD;  Location: [facility] INVASIVE CV LAB;  Service: Cardiovascular;  Laterality: Left;   A/V FISTULAGRAM Left 04/16/2023   Procedure: A/V Fistulagram;  Surgeon: Lanis Fonda BRAVO, MD;  Location: [facility] INVASIVE CV LAB;  Service: Cardiovascular;  Laterality: Left;   A/V FISTULAGRAM N/A 10/20/2023   Procedure: A/V Fistulagram;  Surgeon: Melia Lynwood ORN, MD;  Location: [facility] INVASIVE CV LAB;  Service: Cardiovascular;  Laterality: N/A;   AMPUTATION TOE Left 09/05/2021   Procedure: AMPUTATION Left Second Toe;  Surgeon: Edna Toribio LABOR, MD;  Location: [facility] OR;  Service: Orthopedics;  Laterality: Left;   AV FISTULA PLACEMENT Left 11/02/2014   Procedure: ARTERIOVENOUS (AV) FISTULA CREATION;  Surgeon: Lynwood JONETTA Collum, MD;  Location: [facility] OR;  Service: Vascular;  Laterality: Left;   AV FISTULA PLACEMENT Left 04/26/2015   Procedure: LEFT BRACHIOCEPHALIC ARTERIOVENOUS (AV) FISTULA CREATION;  Surgeon: Redell LITTIE Door, MD;  Location: [facility] OR;  Service: Vascular;   Laterality: Left;   EYE SURGERY     INSERTION OF DIALYSIS CATHETER Right 11/04/2014   Procedure: INSERTION OF DIALYSIS CATHETER RIGHT INTERNAL JUGULAR VEIN;  Surgeon: Lynwood JONETTA Collum, MD;  Location: [facility] OR;  Service: Vascular;  Laterality: Right;   IR AV DIALY SHUNT INTRO NEEDLE/INTRACATH INITIAL W/PTA/IMG LEFT  06/18/2023   IR US  GUIDE VASC ACCESS LEFT  06/18/2023   LIGATION OF ARTERIOVENOUS  FISTULA Left 04/26/2015   Procedure: LIGATION OF LEFT RADIOCEPHALIC ARTERIOVENOUS  FISTULA;  Surgeon: Redell LITTIE Door, MD;  Location: [facility] OR;  Service: Vascular;  Laterality: Left;   PERIPHERAL VASCULAR BALLOON ANGIOPLASTY  04/16/2023   Procedure: PERIPHERAL VASCULAR BALLOON ANGIOPLASTY;  Surgeon: Lanis Fonda BRAVO, MD;  Location: [facility] INVASIVE CV LAB;  Service: Cardiovascular;;   PERIPHERAL VASCULAR BALLOON ANGIOPLASTY  10/20/2023   Procedure: PERIPHERAL VASCULAR BALLOON ANGIOPLASTY;  Surgeon: Melia Lynwood ORN, MD;  Location: [facility] INVASIVE CV LAB;  Service: Cardiovascular;;  80 % Arch   PERIPHERAL VASCULAR INTERVENTION Left 10/16/2022   Procedure: PERIPHERAL VASCULAR INTERVENTION;  Surgeon: Lanis Fonda BRAVO, MD;  Location: [facility] INVASIVE CV LAB;  Service: Cardiovascular;  Laterality: Left;  left AV fistula   REVISON OF ARTERIOVENOUS FISTULA Left 01/18/2015   Procedure: REVISON OF LEFT RADIOCEPHALIC ARTERIOVENOUS FISTULA USING VASCU-GUARD PERIPHERAL VASCULAR PATCH ;  Surgeon: Lynwood JONETTA Collum, MD;  Location: [facility] OR;  Service: Vascular;  Laterality: Left;   TONSILLECTOMY      Family History  Problem Relation Age of Onset  Diabetes Mother    Diabetes Father    Diabetes Brother    Hypertension Sister     Social History   Socioeconomic History   Marital status: Married    Spouse name: Not on file   Number of children: Not on file   Years of education: Not on file   Highest education level: Not on file  Occupational History   Not on file  Tobacco Use   Smoking status: Former    Current packs/day: 0.00    Types:  Cigarettes    Start date: 01/16/1958    Quit date: 01/17/1984    Years since quitting: 40.3    Passive exposure: Never   Smokeless tobacco: Never  Vaping Use   Vaping status: Never Used  Substance and Sexual Activity   Alcohol use: No    Alcohol/week: 0.0 standard drinks of alcohol   Drug use: No   Sexual activity: Not on file  Other Topics Concern   Not on file  Social History Narrative   Not on file   Social Drivers of Health   Financial Resource Strain: Low Risk  (12/06/2022)   Overall Financial Resource Strain (CARDIA)    Difficulty of Paying Living Expenses: Not very hard  Food Insecurity: No Food Insecurity (12/06/2022)   Hunger Vital Sign    Worried About Running Out of Food in the Last Year: Never true    Ran Out of Food in the Last Year: Never true  Transportation Needs: No Transportation Needs (12/06/2022)   PRAPARE - Administrator, Civil Service (Medical): No    Lack of Transportation (Non-Medical): No  Physical Activity: Inactive (12/06/2022)   Exercise Vital Sign    Days of Exercise per Week: 0 days    Minutes of Exercise per Session: 0 min  Stress: No Stress Concern Present (12/06/2022)   [employer] of Occupational Health - Occupational Stress Questionnaire    Feeling of Stress : Only a little  Social Connections: Moderately Integrated (12/06/2022)   Social Connection and Isolation Panel    Frequency of Communication with Friends and Family: More than three times a week    Frequency of Social Gatherings with Friends and Family: More than three times a week    Attends Religious Services: 1 to 4 times per year    Active Member of [employer] or Organizations: No    Attends Banker Meetings: Never    Marital Status: Married  Catering manager Violence: Not At Risk (12/06/2022)   Humiliation, Afraid, Rape, and Kick questionnaire    Fear of Current or Ex-Partner: No    Emotionally Abused: No    Physically Abused: No    Sexually Abused: No     Allergies  Allergen Reactions   Metformin And Related Other (See Comments)    Metformin contraindication in ESRD Dizziness    Current Facility-Administered Medications  Medication Dose Route Frequency Provider Last Rate Last Admin   Heparin  (Porcine) in NaCl 1000-0.9 UT/500ML-% SOLN    PRN Magda Debby SAILOR, MD   500 mL at 05/03/24 1053   lidocaine  (PF) (XYLOCAINE ) 1 % injection    PRN Magda Debby SAILOR, MD   5 mL at 05/03/24 1053    PHYSICAL EXAM Vitals:   05/03/24 0924 05/03/24 0938 05/03/24 1047  BP: (!) 161/72 (!) 155/69   Pulse: 71 69   Resp: 12 14   Temp: 97.9 F (36.6 C)    TempSrc: Oral    SpO2: 96% 97%  100%   No distress Regular rate and rhythm Unlabored breathing Left arm AVF with pulsatile thrill  PERTINENT LABORATORY AND RADIOLOGIC DATA  Most recent CBC    Latest Ref Rng & Units 04/16/2023    8:26 AM 10/16/2022    9:25 AM 09/06/2021    3:02 AM  CBC  WBC 4.0 - 10.5 K/uL   6.7   Hemoglobin 13.0 - 17.0 g/dL 89.0  87.7  89.9   Hematocrit 39.0 - 52.0 % 32.0  36.0  29.7   Platelets 150 - 400 K/uL   218      Most recent CMP    Latest Ref Rng & Units 01/16/2024    4:08 PM 11/17/2023    9:22 AM 11/10/2023    2:43 PM  CMP  Glucose 70 - 99 mg/dL   842   BUN 8 - 27 mg/dL   51   Creatinine 9.23 - 1.27 mg/dL   2.80   Sodium 865 - 855 mmol/L   140   Potassium 3.5 - 5.2 mmol/L   4.7   Chloride 96 - 106 mmol/L   96   CO2 20 - 29 mmol/L   26   Calcium  8.6 - 10.2 mg/dL   9.3   Total Protein 6.0 - 8.5 g/dL 7.7  7.2  7.2   Total Bilirubin 0.0 - 1.2 mg/dL 0.4  0.4  0.5   Alkaline Phos 44 - 121 IU/L 238  218  237   AST 0 - 40 IU/L 12  10  14    ALT 0 - 44 IU/L 23  21  28      Renal function CrCl cannot be calculated (Patient's most recent lab result is older than the maximum 21 days allowed.).  HbA1c, POC (controlled diabetic range) (%)  Date Value  01/16/2024 6.8   Hgb A1c MFr Bld (%)  Date Value  04/20/2024 7.4 (H)    LDL Chol Calc (NIH)  Date Value  Ref Range Status  11/17/2023 49 0 - 99 mg/dL Final    Debby SAILOR. Magda, MD FACS [facility] Phone Number: [phone] 05/03/2024 11:16 AM   Total time spent on preparing this encounter including chart review, data review, collecting history, examining the patient, and coordinating care: 30 min  Portions of this report may have been transcribed using voice recognition software.  Every effort has been made to ensure accuracy; however, inadvertent computerized transcription errors may still be present.

## 2024-05-03 NOTE — Op Note (Addendum)
 DATE OF SERVICE: 05/03/2024  PATIENT:  Brian Fuller  73 y.o. male  PRE-OPERATIVE DIAGNOSIS:  end-stage renal disease; pulsatile AVF with prolonged bleeding  POST-OPERATIVE DIAGNOSIS:  Same  PROCEDURE:   1) Ultrasound guided left arm AVF access (CPT [phone]) 2) Fistulagram with angioplasty and stenting (CPT [id number]) - 8x81mm Viabahn to cephalic arch; 7x42mm AVF outflow angioplasty 3) established outpatient evaluation and management - level 3 (CPT 99213)  SURGEON:  Debby SAILOR. Magda, MD  ASSISTANT: none  ANESTHESIA:   local  ESTIMATED BLOOD LOSS: min  LOCAL MEDICATIONS USED:  LIDOCAINE    COUNTS: confirmed correct.  PATIENT DISPOSITION:  PACU - hemodynamically stable.   Delay start of Pharmacological VTE agent (>24hrs) due to surgical blood loss or risk of bleeding: no  INDICATION FOR PROCEDURE: Brian Fuller is a 73 y.o. male with ESRD on HD via LUE BC AVF. The fistula is pulsatile and has prolonged bleeding after dialysis. After careful discussion of risks, benefits, and alternatives the patient was offered fistulagram. The patient understood and wished to proceed.  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 The patient was positioned supine on the operating room table.  The left upper extremity was prepped and draped in standard fashion. A surgical pause was performed confirming correct patient, procedure, and operative location.  The left upper extremity was anesthetized with subcutaneous injection of 1% lidocaine  over the area of planned access. Using ultrasound guidance, the left arm dialysis access was accessed  with micropuncture technique.  Fistulogram was performed in stations with the micro sheath.  See above for details.  The decision was made to intervene. The lesions were crossed with a glidewire. Access was upsized to 55F. Angioplasty was initially performed for the cephalic arch lesion, but this recurred with about 70% residual stenosis. I stented the lesion with a 8x14mm Viabahn. Much improved flow was noted after this. Immediately beyond two areas of aneurysm was a 70% stenosis which resolved with angioplasty with a 7x86mm Mustang balloon.  All endovascular equipment was removed.  A figure-of-eight stitch was applied to the exit site with good hemostasis.  Sterile bandage was applied.  Upon completion of the case instrument and sharps counts were confirmed correct. The patient was transferred to the PACU in good condition. I was present for all portions of the procedure.  PLAN: Fistula remains amenable to percutaneous intervention. DAPT x 90 days. ASA monotherapy thereafter. Follow up as needed for access issues.   Debby SAILOR. Magda, MD [facility] Phone Number: [phone] 05/03/2024 11:19 AM

## 2024-05-04 ENCOUNTER — Encounter (HOSPITAL_COMMUNITY): Payer: Self-pay | Admitting: Vascular Surgery

## 2024-05-11 ENCOUNTER — Other Ambulatory Visit: Payer: Self-pay

## 2024-05-11 ENCOUNTER — Encounter (HOSPITAL_COMMUNITY): Payer: Self-pay | Admitting: *Deleted

## 2024-05-11 MED ORDER — ONDANSETRON 4 MG PO TBDP
4.0000 mg | ORAL_TABLET | Freq: Three times a day (TID) | ORAL | 3 refills | Status: AC | PRN
  Filled 2024-05-11: qty 90, 30d supply, fill #0

## 2024-05-12 ENCOUNTER — Other Ambulatory Visit: Payer: Self-pay

## 2024-06-03 ENCOUNTER — Other Ambulatory Visit: Payer: Self-pay

## 2024-06-03 ENCOUNTER — Encounter (HOSPITAL_COMMUNITY): Payer: Self-pay | Admitting: *Deleted

## 2024-06-14 ENCOUNTER — Other Ambulatory Visit: Payer: Self-pay

## 2024-07-20 ENCOUNTER — Ambulatory Visit: Payer: Self-pay | Admitting: Nurse Practitioner

## 2024-07-22 ENCOUNTER — Encounter (HOSPITAL_COMMUNITY): Payer: Self-pay | Admitting: *Deleted

## 2024-07-22 ENCOUNTER — Other Ambulatory Visit: Payer: Self-pay

## 2024-07-22 MED ORDER — CINACALCET HCL 60 MG PO TABS
60.0000 mg | ORAL_TABLET | Freq: Every day | ORAL | 11 refills | Status: AC
  Filled 2024-07-22: qty 30, 30d supply, fill #0

## 2024-07-30 ENCOUNTER — Other Ambulatory Visit: Payer: Self-pay

## 2024-08-02 ENCOUNTER — Other Ambulatory Visit: Payer: Self-pay

## 2024-09-01 ENCOUNTER — Ambulatory Visit: Payer: Self-pay | Admitting: Nurse Practitioner

## 2024-09-10 ENCOUNTER — Other Ambulatory Visit: Payer: Self-pay | Admitting: Nurse Practitioner

## 2024-09-10 ENCOUNTER — Encounter (HOSPITAL_COMMUNITY): Payer: Self-pay | Admitting: *Deleted

## 2024-09-10 ENCOUNTER — Other Ambulatory Visit: Payer: Self-pay

## 2024-09-10 DIAGNOSIS — E1122 Type 2 diabetes mellitus with diabetic chronic kidney disease: Secondary | ICD-10-CM

## 2024-09-10 DIAGNOSIS — E1159 Type 2 diabetes mellitus with other circulatory complications: Secondary | ICD-10-CM

## 2024-09-10 MED ORDER — INSULIN LISPRO PROT & LISPRO (75-25 MIX) 100 UNIT/ML KWIKPEN
20.0000 [IU] | PEN_INJECTOR | Freq: Two times a day (BID) | SUBCUTANEOUS | 0 refills | Status: AC
  Filled 2024-09-10: qty 15, 38d supply, fill #0

## 2024-09-10 MED ORDER — LOSARTAN POTASSIUM 25 MG PO TABS
25.0000 mg | ORAL_TABLET | Freq: Every day | ORAL | 0 refills | Status: AC
  Filled 2024-09-10: qty 90, 90d supply, fill #0

## 2024-09-10 MED ORDER — AMLODIPINE BESYLATE 10 MG PO TABS
10.0000 mg | ORAL_TABLET | Freq: Every day | ORAL | 0 refills | Status: AC
  Filled 2024-09-10: qty 90, 90d supply, fill #0

## 2024-09-10 NOTE — Telephone Encounter (Signed)
 Requested Prescriptions  Pending Prescriptions Disp Refills   Insulin  Lispro Prot & Lispro (HUMALOG  MIX 75/25 KWIKPEN) (75-25) 100 UNIT/ML Kwikpen 45 mL 0    Sig: Inject 20 Units into the skin in the morning and at bedtime.     Endocrinology:  Diabetes - Insulins Passed - 09/10/2024  2:55 PM      Passed - HBA1C is between 0 and 7.9 and within 180 days    HbA1c, POC (controlled diabetic range)  Date Value Ref Range Status  01/16/2024 6.8 0.0 - 7.0 % Final   Hgb A1c MFr Bld  Date Value Ref Range Status  04/20/2024 7.4 (H) 4.8 - 5.6 % Final    Comment:             Prediabetes: 5.7 - 6.4          Diabetes: >6.4          Glycemic control for adults with diabetes: <7.0          Passed - Valid encounter within last 6 months    Recent Outpatient Visits           4 months ago Primary hypertension   [facility] [employer]. [facility] [place], [place] W, NP   7 months ago Type 2 diabetes mellitus with chronic kidney disease on chronic dialysis, without long-term current use of insulin  [facility])   [facility] [place]. [facility] [place], [place] W, NP   10 months ago Type 2 diabetes mellitus with chronic kidney disease on chronic dialysis, without long-term current use of insulin  [facility])   [facility] [place]. 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 [facility] [place], [place] W, NP   1 year ago Type 2 diabetes mellitus with chronic kidney disease on chronic dialysis, without long-term current use of insulin  [facility])   [facility] [place]. [facility] [place], [place] W, NP               amLODipine  (NORVASC ) 10 MG tablet 90  tablet 0    Sig: Take 1 tablet (10 mg total) by mouth daily. para la presi n arterial.     Cardiovascular: Calcium  Channel Blockers 2 Failed - 09/10/2024  2:55 PM      Failed - Last BP in normal range    BP Readings from Last 1 Encounters:  05/03/24 (!) 152/61         Passed - Last Heart Rate in normal range    Pulse Readings from Last 1 Encounters:  05/03/24 67         Passed - Valid encounter within last 6 months    Recent Outpatient Visits           4 months ago Primary hypertension   [facility] [place]. [facility] [place], [place] W, NP   7 months ago Type 2 diabetes mellitus with chronic kidney disease on chronic dialysis, without long-term current use of insulin  [facility])   [facility] [place]. [facility]  Theotis Haze ORN, NP   10 months ago Type 2 diabetes mellitus with chronic kidney disease on chronic dialysis, without long-term current use of insulin  [facility])   [facility] [place]. 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 [facility] [place], [place] W, NP   1 year ago Type 2 diabetes mellitus with chronic kidney disease on chronic dialysis, without long-term current use of insulin  [facility])   [facility] [place]. [facility] [place], [place] W, NP               losartan  (COZAAR ) 25 MG tablet 90 tablet 0    Sig: Take 1 tablet (25 mg total) by mouth daily. para la presi n arterial     Cardiovascular:  Angiotensin Receptor Blockers Failed - 09/10/2024  2:55 PM      Failed - Cr in normal range and within 180 days    Creat  Date Value Ref Range Status  01/18/2016 6.82 (H) 0.70 - 1.25 mg/dL Final    Creatinine, Ser  Date Value Ref Range Status  11/10/2023 7.19 (H) 0.76 - 1.27 mg/dL Final   Creatinine, Urine  Date Value Ref Range Status  02/24/2014 35.36 mg/dL Final         Failed - K in normal range and within 180 days    Potassium  Date Value Ref Range Status  11/10/2023 4.7 3.5 - 5.2 mmol/L Final         Failed - Last BP in normal range    BP Readings from Last 1 Encounters:  05/03/24 (!) 152/61         Passed - Patient is not pregnant      Passed - Valid encounter within last 6 months    Recent Outpatient Visits           4 months ago Primary hypertension   [facility] [place]. [facility], [place] W, NP   7 months ago Type 2 diabetes mellitus with chronic kidney disease on chronic dialysis, without long-term current use of insulin  [facility])   [facility] [place]. [facility] [place], [place] W, NP   10 months ago Type 2 diabetes mellitus with chronic kidney disease on chronic dialysis, without long-term current use of insulin  [facility])   [facility] [place]. 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 [facility] [place], [place] W, NP   1 year ago Type 2 diabetes mellitus with chronic kidney disease on chronic dialysis, without long-term current use of insulin  [facility])   [place] [facility] [place]. [facility] Theotis Haze ORN, [place]

## 2024-09-17 ENCOUNTER — Ambulatory Visit (HOSPITAL_COMMUNITY)
Admission: RE | Admit: 2024-09-17 | Discharge: 2024-09-17 | Disposition: A | Discharge status: Home / Self Care | Payer: Self-pay | Attending: Vascular Surgery | Admitting: Vascular Surgery

## 2024-09-17 ENCOUNTER — Other Ambulatory Visit: Payer: Self-pay

## 2024-09-17 ENCOUNTER — Encounter (HOSPITAL_COMMUNITY)
Admission: RE | Disposition: A | Discharge status: Home / Self Care | Payer: Self-pay | Source: Home / Self Care | Attending: Vascular Surgery

## 2024-09-17 DIAGNOSIS — Z794 Long term (current) use of insulin: Secondary | ICD-10-CM | POA: Insufficient documentation

## 2024-09-17 DIAGNOSIS — Z992 Dependence on renal dialysis: Secondary | ICD-10-CM | POA: Insufficient documentation

## 2024-09-17 DIAGNOSIS — T82898A Other specified complication of vascular prosthetic devices, implants and grafts, initial encounter: Secondary | ICD-10-CM

## 2024-09-17 DIAGNOSIS — T82858A Stenosis of vascular prosthetic devices, implants and grafts, initial encounter: Secondary | ICD-10-CM | POA: Insufficient documentation

## 2024-09-17 DIAGNOSIS — I120 Hypertensive chronic kidney disease with stage 5 chronic kidney disease or end stage renal disease: Secondary | ICD-10-CM | POA: Insufficient documentation

## 2024-09-17 DIAGNOSIS — I871 Compression of vein: Secondary | ICD-10-CM | POA: Insufficient documentation

## 2024-09-17 DIAGNOSIS — E1122 Type 2 diabetes mellitus with diabetic chronic kidney disease: Secondary | ICD-10-CM | POA: Insufficient documentation

## 2024-09-17 DIAGNOSIS — Z87891 Personal history of nicotine dependence: Secondary | ICD-10-CM | POA: Insufficient documentation

## 2024-09-17 DIAGNOSIS — N186 End stage renal disease: Secondary | ICD-10-CM | POA: Insufficient documentation

## 2024-09-17 DIAGNOSIS — Z7984 Long term (current) use of oral hypoglycemic drugs: Secondary | ICD-10-CM | POA: Insufficient documentation

## 2024-09-17 DIAGNOSIS — Y832 Surgical operation with anastomosis, bypass or graft as the cause of abnormal reaction of the patient, or of later complication, without mention of misadventure at the time of the procedure: Secondary | ICD-10-CM | POA: Insufficient documentation

## 2024-09-17 LAB — GLUCOSE, CAPILLARY: Glucose-Capillary: 159 mg/dL — ABNORMAL HIGH (ref 70–99)

## 2024-09-17 MED ORDER — LIDOCAINE HCL (PF) 1 % IJ SOLN
INTRAMUSCULAR | Status: DC | PRN
  Administered 2024-09-17: 2 mL via INTRADERMAL

## 2024-09-17 MED ORDER — HEPARIN (PORCINE) IN NACL 1000-0.9 UT/500ML-% IV SOLN
INTRAVENOUS | Status: DC | PRN
  Administered 2024-09-17: 500 mL

## 2024-09-17 MED ORDER — IODIXANOL 320 MG/ML IV SOLN
INTRAVENOUS | Status: DC | PRN
  Administered 2024-09-17: 35 mL

## 2024-09-17 MED ORDER — LIDOCAINE HCL (PF) 1 % IJ SOLN
INTRAMUSCULAR | Status: AC
  Filled 2024-09-17: qty 30

## 2024-09-18 ENCOUNTER — Encounter (HOSPITAL_COMMUNITY): Payer: Self-pay | Admitting: Vascular Surgery

## 2024-09-20 ENCOUNTER — Encounter (HOSPITAL_COMMUNITY): Payer: Self-pay | Admitting: Vascular Surgery

## 2024-09-24 ENCOUNTER — Ambulatory Visit: Payer: Self-pay | Admitting: Nurse Practitioner

## 2025-02-04 ENCOUNTER — Ambulatory Visit: Payer: Self-pay | Admitting: Nurse Practitioner
